# Patient Record
Sex: Male | Born: 1965 | Race: White | Hispanic: No | Smoking: Never smoker
Health system: Southern US, Community
[De-identification: ages and names within clinical notes are randomized; demographics above are authoritative.]

## PROBLEM LIST (undated history)

## (undated) DIAGNOSIS — F419 Anxiety disorder, unspecified: Secondary | ICD-10-CM

## (undated) DIAGNOSIS — F1010 Alcohol abuse, uncomplicated: Secondary | ICD-10-CM

## (undated) DIAGNOSIS — F329 Major depressive disorder, single episode, unspecified: Secondary | ICD-10-CM

## (undated) DIAGNOSIS — K219 Gastro-esophageal reflux disease without esophagitis: Secondary | ICD-10-CM

## (undated) DIAGNOSIS — F32A Depression, unspecified: Secondary | ICD-10-CM

## (undated) DIAGNOSIS — I10 Essential (primary) hypertension: Secondary | ICD-10-CM

## (undated) HISTORY — DX: Gastro-esophageal reflux disease without esophagitis: K21.9

## (undated) HISTORY — PX: NO PAST SURGERIES: SHX2092

---

## 2003-01-29 ENCOUNTER — Emergency Department (HOSPITAL_COMMUNITY): Admission: EM | Admit: 2003-01-29 | Discharge: 2003-01-29 | Payer: Self-pay | Admitting: Emergency Medicine

## 2010-11-25 ENCOUNTER — Ambulatory Visit
Admission: RE | Admit: 2010-11-25 | Discharge: 2010-11-25 | Disposition: A | Discharge status: Home / Self Care | Payer: Self-pay | Source: Ambulatory Visit | Attending: Allergy | Admitting: Allergy

## 2010-11-25 ENCOUNTER — Other Ambulatory Visit: Payer: Self-pay | Admitting: Allergy

## 2010-11-25 DIAGNOSIS — R05 Cough: Secondary | ICD-10-CM

## 2012-05-16 ENCOUNTER — Ambulatory Visit: Payer: Managed Care, Other (non HMO) | Admitting: Family Medicine

## 2012-05-16 DIAGNOSIS — Z0289 Encounter for other administrative examinations: Secondary | ICD-10-CM

## 2012-12-06 ENCOUNTER — Emergency Department (HOSPITAL_COMMUNITY): Payer: Managed Care, Other (non HMO)

## 2012-12-06 ENCOUNTER — Encounter (HOSPITAL_COMMUNITY): Payer: Self-pay | Admitting: Emergency Medicine

## 2012-12-06 ENCOUNTER — Emergency Department (HOSPITAL_COMMUNITY)
Admission: EM | Admit: 2012-12-06 | Discharge: 2012-12-06 | Disposition: A | Discharge status: Home / Self Care | Payer: Managed Care, Other (non HMO) | Attending: Emergency Medicine | Admitting: Emergency Medicine

## 2012-12-06 DIAGNOSIS — R42 Dizziness and giddiness: Secondary | ICD-10-CM | POA: Insufficient documentation

## 2012-12-06 DIAGNOSIS — H538 Other visual disturbances: Secondary | ICD-10-CM | POA: Insufficient documentation

## 2012-12-06 DIAGNOSIS — Z79899 Other long term (current) drug therapy: Secondary | ICD-10-CM | POA: Insufficient documentation

## 2012-12-06 LAB — RAPID URINE DRUG SCREEN, HOSP PERFORMED
Amphetamines: NOT DETECTED
Opiates: NOT DETECTED
Tetrahydrocannabinol: NOT DETECTED

## 2012-12-06 LAB — URINALYSIS, ROUTINE W REFLEX MICROSCOPIC
Hgb urine dipstick: NEGATIVE
Ketones, ur: NEGATIVE mg/dL
Protein, ur: NEGATIVE mg/dL
Urobilinogen, UA: 0.2 mg/dL (ref 0.0–1.0)

## 2012-12-06 LAB — CBC WITH DIFFERENTIAL/PLATELET
Basophils Relative: 0 % (ref 0–1)
Eosinophils Absolute: 0 10*3/uL (ref 0.0–0.7)
Eosinophils Relative: 0 % (ref 0–5)
Lymphs Abs: 1.8 10*3/uL (ref 0.7–4.0)
MCH: 31 pg (ref 26.0–34.0)
MCHC: 34.6 g/dL (ref 30.0–36.0)
MCV: 89.4 fL (ref 78.0–100.0)
Monocytes Relative: 8 % (ref 3–12)
Neutro Abs: 5.3 10*3/uL (ref 1.7–7.7)
Platelets: 247 10*3/uL (ref 150–400)
RBC: 4.91 MIL/uL (ref 4.22–5.81)
RDW: 12.4 % (ref 11.5–15.5)
WBC: 7.8 10*3/uL (ref 4.0–10.5)

## 2012-12-06 LAB — APTT: aPTT: 28 seconds (ref 24–37)

## 2012-12-06 LAB — POCT I-STAT, CHEM 8
Calcium, Ion: 1.27 mmol/L — ABNORMAL HIGH (ref 1.12–1.23)
Chloride: 97 mEq/L (ref 96–112)
Creatinine, Ser: 1.2 mg/dL (ref 0.50–1.35)
Glucose, Bld: 86 mg/dL (ref 70–99)
Potassium: 3.9 mEq/L (ref 3.5–5.1)

## 2012-12-06 LAB — POCT I-STAT TROPONIN I

## 2012-12-06 LAB — PROTIME-INR: Prothrombin Time: 12.6 seconds (ref 11.6–15.2)

## 2012-12-06 LAB — GLUCOSE, CAPILLARY: Glucose-Capillary: 80 mg/dL (ref 70–99)

## 2012-12-06 MED ORDER — MECLIZINE HCL 25 MG PO TABS: 25.0000 mg | ORAL_TABLET | Freq: Three times a day (TID) | ORAL | Status: DC | PRN

## 2012-12-06 MED ORDER — GADOBENATE DIMEGLUMINE 529 MG/ML IV SOLN
15.0000 mL | Freq: Once | INTRAVENOUS | Status: AC | PRN
  Administered 2012-12-06: 15 mL via INTRAVENOUS

## 2012-12-06 NOTE — Consult Note (Signed)
Reason for Consult: Persistent vertigo.  HPI:                                                                                                                                          Alex Logan is an 47 y.o. male with no documented previous medical history who has been experiencing symptoms of disequilibrium since 12/03/2012. He said he said a feeling of turning or spinning, as well as a feeling of falling to one side. Symptoms are worsened with movement. He's had episodes of nausea and vomiting as well. He has not had visual changes including no diplopia. He said no changes in speech or swallowing. She also has not had motor or sensory abnormalities focally. He was given meclizine earlier today at urgent care and is feeling better at this point. CT scan of his head as well as MRI showed no intracranial abnormalities.  History reviewed. No pertinent past medical history.  History reviewed. No pertinent past surgical history.  History reviewed. No pertinent family history.  Social History:  reports that he has never smoked. He does not have any smokeless tobacco history on file. He reports that he drinks alcohol. He reports that he does not use illicit drugs.  No Known Allergies  MEDICATIONS:                                                                                                                     I have reviewed the patient's current medications.   ROS:                                                                                                                                       History obtained from the patient  General ROS: negative for - chills, fatigue, fever, night sweats, weight gain or  weight loss Psychological ROS: negative for - behavioral disorder, hallucinations, memory difficulties, mood swings or suicidal ideation Ophthalmic ROS: negative for - blurry vision, double vision, eye pain or loss of vision ENT ROS: negative for - epistaxis, nasal discharge, oral  lesions, sore throat, tinnitus or vertigo Allergy and Immunology ROS: negative for - hives or itchy/watery eyes Hematological and Lymphatic ROS: negative for - bleeding problems, bruising or swollen lymph nodes Endocrine ROS: negative for - galactorrhea, hair pattern changes, polydipsia/polyuria or temperature intolerance Respiratory ROS: negative for - cough, hemoptysis, shortness of breath or wheezing Cardiovascular ROS: negative for - chest pain, dyspnea on exertion, edema or irregular heartbeat Gastrointestinal ROS: negative for - abdominal pain, diarrhea, hematemesis, nausea/vomiting or stool incontinence Genito-Urinary ROS: negative for - dysuria, hematuria, incontinence or urinary frequency/urgency Musculoskeletal ROS: negative for - joint swelling or muscular weakness Neurological ROS: as noted in HPI Dermatological ROS: negative for rash and skin lesion changes   Blood pressure 130/88, pulse 84, temperature 97.4 F (36.3 C), temperature source Oral, resp. rate 18, height 5\' 11"  (1.803 m), weight 74.844 kg (165 lb), SpO2 98.00%.   Neurologic Examination:                                                                                                      Mental Status: Alert, oriented, thought content appropriate.  Speech fluent without evidence of aphasia. Able to follow commands without difficulty. No onset of symptoms of vertigo with movement including sitting from supine position, as well as with standing. Cranial Nerves: II-Visual fields were normal. III/IV/VI-Pupils were equal and reacted. Extraocular movements were full and conjugate with no nystagmus.    V/VII-no facial numbness and no facial weakness. VIII-normal. X-normal speech and symmetrical palatal movement. Motor: 5/5 bilaterally with normal tone and bulk Sensory: Normal throughout; no Romberg sign. Deep Tendon Reflexes: 2+ and symmetric. Plantars: Mute bilaterally Cerebellar: Normal finger-to-nose testing; no  ataxia on standing.   No results found for this basename: cbc, bmp, coags, chol, tri, ldl, hga1c    Results for orders placed during the hospital encounter of 12/06/12 (from the past 48 hour(s))  ETHANOL     Status: None   Collection Time    12/06/12  2:29 PM      Result Value Range   Alcohol, Ethyl (B) <11  0 - 11 mg/dL   Comment:            LOWEST DETECTABLE LIMIT FOR     SERUM ALCOHOL IS 11 mg/dL     FOR MEDICAL PURPOSES ONLY  APTT     Status: None   Collection Time    12/06/12  2:29 PM      Result Value Range   aPTT 28  24 - 37 seconds  CBC WITH DIFFERENTIAL     Status: None   Collection Time    12/06/12  2:29 PM      Result Value Range   WBC 7.8  4.0 - 10.5 K/uL   RBC 4.91  4.22 - 5.81 MIL/uL   Hemoglobin 15.2  13.0 - 17.0  g/dL   HCT 16.1  09.6 - 04.5 %   MCV 89.4  78.0 - 100.0 fL   MCH 31.0  26.0 - 34.0 pg   MCHC 34.6  30.0 - 36.0 g/dL   RDW 40.9  81.1 - 91.4 %   Platelets 247  150 - 400 K/uL   Neutrophils Relative % 68  43 - 77 %   Neutro Abs 5.3  1.7 - 7.7 K/uL   Lymphocytes Relative 23  12 - 46 %   Lymphs Abs 1.8  0.7 - 4.0 K/uL   Monocytes Relative 8  3 - 12 %   Monocytes Absolute 0.6  0.1 - 1.0 K/uL   Eosinophils Relative 0  0 - 5 %   Eosinophils Absolute 0.0  0.0 - 0.7 K/uL   Basophils Relative 0  0 - 1 %   Basophils Absolute 0.0  0.0 - 0.1 K/uL  PROTIME-INR     Status: None   Collection Time    12/06/12  2:29 PM      Result Value Range   Prothrombin Time 12.6  11.6 - 15.2 seconds   INR 0.96  0.00 - 1.49  GLUCOSE, CAPILLARY     Status: None   Collection Time    12/06/12  2:52 PM      Result Value Range   Glucose-Capillary 80  70 - 99 mg/dL  POCT I-STAT TROPONIN I     Status: None   Collection Time    12/06/12  2:58 PM      Result Value Range   Troponin i, poc 0.00  0.00 - 0.08 ng/mL   Comment 3            Comment: Due to the release kinetics of cTnI,     a negative result within the first hours     of the onset of symptoms does not rule out      myocardial infarction with certainty.     If myocardial infarction is still suspected,     repeat the test at appropriate intervals.  POCT I-STAT, CHEM 8     Status: Abnormal   Collection Time    12/06/12  2:59 PM      Result Value Range   Sodium 138  135 - 145 mEq/L   Potassium 3.9  3.5 - 5.1 mEq/L   Chloride 97  96 - 112 mEq/L   BUN 9  6 - 23 mg/dL   Creatinine, Ser 7.82  0.50 - 1.35 mg/dL   Glucose, Bld 86  70 - 99 mg/dL   Calcium, Ion 9.56 (*) 1.12 - 1.23 mmol/L   TCO2 29  0 - 100 mmol/L   Hemoglobin 16.0  13.0 - 17.0 g/dL   HCT 21.3  08.6 - 57.8 %  URINE RAPID DRUG SCREEN (HOSP PERFORMED)     Status: None   Collection Time    12/06/12  5:25 PM      Result Value Range   Opiates NONE DETECTED  NONE DETECTED   Cocaine NONE DETECTED  NONE DETECTED   Benzodiazepines NONE DETECTED  NONE DETECTED   Amphetamines NONE DETECTED  NONE DETECTED   Tetrahydrocannabinol NONE DETECTED  NONE DETECTED   Barbiturates NONE DETECTED  NONE DETECTED   Comment:            DRUG SCREEN FOR MEDICAL PURPOSES     ONLY.  IF CONFIRMATION IS NEEDED     FOR ANY PURPOSE, NOTIFY LAB  WITHIN 5 DAYS.                LOWEST DETECTABLE LIMITS     FOR URINE DRUG SCREEN     Drug Class       Cutoff (ng/mL)     Amphetamine      1000     Barbiturate      200     Benzodiazepine   200     Tricyclics       300     Opiates          300     Cocaine          300     THC              50  URINALYSIS, ROUTINE W REFLEX MICROSCOPIC     Status: None   Collection Time    12/06/12  5:25 PM      Result Value Range   Color, Urine YELLOW  YELLOW   APPearance CLEAR  CLEAR   Specific Gravity, Urine 1.015  1.005 - 1.030   pH 6.5  5.0 - 8.0   Glucose, UA NEGATIVE  NEGATIVE mg/dL   Hgb urine dipstick NEGATIVE  NEGATIVE   Bilirubin Urine NEGATIVE  NEGATIVE   Ketones, ur NEGATIVE  NEGATIVE mg/dL   Protein, ur NEGATIVE  NEGATIVE mg/dL   Urobilinogen, UA 0.2  0.0 - 1.0 mg/dL   Nitrite NEGATIVE  NEGATIVE    Leukocytes, UA NEGATIVE  NEGATIVE   Comment: MICROSCOPIC NOT DONE ON URINES WITH NEGATIVE PROTEIN, BLOOD, LEUKOCYTES, NITRITE, OR GLUCOSE <1000 mg/dL.    Ct Head Wo Contrast  12/06/2012   CLINICAL DATA:  Dizziness, nausea, vomiting  EXAM: CT HEAD WITHOUT CONTRAST  TECHNIQUE: Contiguous axial images were obtained from the base of the skull through the vertex without intravenous contrast.  COMPARISON:  None.  FINDINGS: No acute intracranial abnormality is identified. Specifically, negative for hemorrhage, hydrocephalus, mass effect, mass lesion, or evidence of acute cortically based infarction. The skull is intact. The imaged paranasal sinuses and mastoid air cells are clear. The middle ears are clear. Soft tissues of the scalp and orbits are symmetric.  IMPRESSION: No acute intracranial abnormality.   Electronically Signed   By: Britta Mccreedy M.D.   On: 12/06/2012 14:45   Mr Laqueta Jean ZH Contrast  12/06/2012   CLINICAL DATA:  47 year old male with dizziness starting several days ago. Continued, recurrent episodes of dizziness, ataxia. Vomiting. Nystagmus. Initial encounter.  EXAM: MRI HEAD WITHOUT AND WITH CONTRAST  TECHNIQUE: Multiplanar, multiecho pulse sequences of the brain and surrounding structures were obtained without and with intravenous contrast.  CONTRAST:  15mL MULTIHANCE GADOBENATE DIMEGLUMINE 529 MG/ML IV SOLN  COMPARISON:  Head CT without contrast 12/06/2012.  FINDINGS: Cerebral volume is normal. No restricted diffusion to suggest acute infarction. No midline shift, mass effect, evidence of mass lesion, ventriculomegaly, extra-axial collection or acute intracranial hemorrhage. Cervicomedullary junction and pituitary are within normal limits. Negative visualized cervical spine. Major intracranial vascular flow voids are preserved.  Wallace Cullens and white matter signal is within normal limits for age throughout the brain.  Visualized internal auditory structures appear normal. Mastoids are clear. No  abnormal enhancement identified.  Left maxillary sinus mucous retention cyst. Other paranasal sinuses are clear. Visualized orbit soft tissues are within normal limits. Visualized scalp soft tissues are within normal limits. Normal bone marrow signal.  IMPRESSION: Normal for age MRI appearance of the brain with no acute intracranial abnormality.  Electronically Signed   By: Augusto Gamble M.D.   On: 12/06/2012 17:12   Assessment/Plan: 47 year old man presenting with probable benign positional vertigo, most likely manifestation of peripheral labyrinth dysfunction. Patient has no indication of acute posterior fossa lesion. Symptoms have markedly improved with meclizine.  Recommendations: 1. No indications at this point for inpatient observation or admission. 2. No further neurodiagnostic studies are indicated. 3. Continue meclizine 25 mg 3 times a day when necessary vertigo. 4. Followup with primary physician if symptoms persist.  C.R. Roseanne Reno, MD [facility] [phone]  12/06/2012, 8:40 PM

## 2012-12-06 NOTE — ED Provider Notes (Signed)
CSN: [id number]     Arrival date & time 12/06/12  1254 History   First MD Initiated Contact with Patient 12/06/12 1409     Chief Complaint  Patient presents with  . Dizziness   (Consider location/radiation/quality/duration/timing/severity/associated sxs/prior Treatment) HPI Comments: Patient is otherwise healthy 47 year old male who presents to the ED with dizziness starting on Tuesday.  He states that he initially noticed the dizziness while at work, where he felt like the room was spinning.  He states that he went to his car rested for about an hour and this resolved spontaneously.  He states that he went on to his second job and had a few other short lasting episodes.  He states that the evening came and he returned home, had an episode of dizziness after driving home, noticed that he thought his vision was worse, and states that he felt unsteady walking and kept veering to the left.  He felt nauseous and vomited x 1.  He states that he did not join his family for Thanksgiving but that he rested, he states that he had a few more episodes of the dizziness but when he awoke this morning, he noticed more dizziness, and difficulty walking.  He went to the Urgent care and they sent him here for workup for possible CVA.  He reports no focal weakness, numbness or tingling.  No difficulty speaking or swallowing, does have an ataxic gait and dizziness.    Patient is a 47 y.o. male presenting with neurologic complaint. The history is provided by the patient. No language interpreter was used.  Neurologic Problem This is a new problem. The current episode started in the past 7 days. The problem occurs constantly. The problem has been gradually worsening. Associated symptoms include nausea, vertigo and vomiting. Pertinent negatives include no abdominal pain, anorexia, chest pain, chills, congestion, coughing, diaphoresis, fatigue, fever, headaches, myalgias, neck pain, rash, sore throat, visual change or  weakness. Nothing aggravates the symptoms. He has tried nothing for the symptoms. The treatment provided no relief.    History reviewed. No pertinent past medical history. History reviewed. No pertinent past surgical history. History reviewed. No pertinent family history. History  Substance Use Topics  . Smoking status: Never Smoker   . Smokeless tobacco: Not on file  . Alcohol Use: Yes     Comment: vodka every other day    Review of Systems  Constitutional: Negative for fever, chills, diaphoresis and fatigue.  HENT: Negative for congestion and sore throat.   Eyes: Positive for visual disturbance.  Respiratory: Negative for cough.   Cardiovascular: Negative for chest pain.  Gastrointestinal: Positive for nausea and vomiting. Negative for abdominal pain and anorexia.  Musculoskeletal: Negative for myalgias and neck pain.  Skin: Negative for rash.  Neurological: Positive for vertigo. Negative for weakness and headaches.  All other systems reviewed and are negative.    Allergies  Review of patient's allergies indicates no known allergies.  Home Medications   Current Outpatient Rx  Name  Route  Sig  Dispense  Refill  . escitalopram (LEXAPRO) 20 MG tablet   Oral   Take 20 mg by mouth daily.         . Glucosamine HCl (GLUCOSAMINE PO)   Oral   Take 1 tablet by mouth daily.         . Multiple Vitamin (MULTIVITAMIN WITH MINERALS) TABS tablet   Oral   Take 1 tablet by mouth daily.         Marland Kitchen  Omega-3 Fatty Acids (FISH OIL PO)   Oral   Take 1 capsule by mouth daily.          BP 132/90  Pulse 114  Temp(Src) 97.4 F (36.3 C) (Oral)  Resp 24  Ht 5\' 11"  (1.803 m)  Wt 165 lb (74.844 kg)  BMI 23.02 kg/m2  SpO2 100% Physical Exam  Nursing note and vitals reviewed. Constitutional: He is oriented to person, place, and time. He appears well-developed and well-nourished. No distress.  HENT:  Head: Normocephalic and atraumatic.  Right Ear: External ear normal.  Left  Ear: External ear normal.  Nose: Nose normal.  Mouth/Throat: Oropharynx is clear and moist. No oropharyngeal exudate.  Negative dix hallpike maneuver  Eyes: Conjunctivae are normal. Pupils are equal, round, and reactive to light. No scleral icterus.  No nystagmus  Neck: Normal range of motion. Neck supple. No JVD present. Carotid bruit is not present.  Cardiovascular: Normal rate, regular rhythm and normal heart sounds.  Exam reveals no gallop and no friction rub.   No murmur heard. Pulmonary/Chest: Effort normal and breath sounds normal. No respiratory distress. He has no wheezes. He has no rales. He exhibits no tenderness.  Abdominal: Soft. Bowel sounds are normal. He exhibits no distension. There is no tenderness. There is no rebound and no guarding.  Musculoskeletal: Normal range of motion. He exhibits no edema and no tenderness.  Neurological: He is alert and oriented to person, place, and time. He has normal reflexes. No cranial nerve deficit or sensory deficit. He exhibits normal muscle tone. Gait abnormal. Coordination normal. GCS eye subscore is 4. GCS verbal subscore is 5. GCS motor subscore is 6.  Slightly ataxic gait, negative romberg, slight overreach with finger to nose testing.  Skin: Skin is warm and dry. No rash noted. No erythema. No pallor.  Psychiatric: He has a normal mood and affect. His behavior is normal. Judgment and thought content normal.    ED Course  Procedures (including critical care time) Labs Review Labs Reviewed  GLUCOSE, CAPILLARY  ETHANOL  URINE RAPID DRUG SCREEN (HOSP PERFORMED)  URINALYSIS, ROUTINE W REFLEX MICROSCOPIC  APTT  CBC WITH DIFFERENTIAL  PROTIME-INR   Imaging Review Ct Head Wo Contrast  12/06/2012   CLINICAL DATA:  Dizziness, nausea, vomiting  EXAM: CT HEAD WITHOUT CONTRAST  TECHNIQUE: Contiguous axial images were obtained from the base of the skull through the vertex without intravenous contrast.  COMPARISON:  None.  FINDINGS: No  acute intracranial abnormality is identified. Specifically, negative for hemorrhage, hydrocephalus, mass effect, mass lesion, or evidence of acute cortically based infarction. The skull is intact. The imaged paranasal sinuses and mastoid air cells are clear. The middle ears are clear. Soft tissues of the scalp and orbits are symmetric.  IMPRESSION: No acute intracranial abnormality.   Electronically Signed   By: Britta Mccreedy M.D.   On: 12/06/2012 14:45    EKG Interpretation   None      Results for orders placed during the hospital encounter of 12/06/12  ETHANOL      Result Value Range   Alcohol, Ethyl (B) <11  0 - 11 mg/dL  APTT      Result Value Range   aPTT 28  24 - 37 seconds  CBC WITH DIFFERENTIAL      Result Value Range   WBC 7.8  4.0 - 10.5 K/uL   RBC 4.91  4.22 - 5.81 MIL/uL   Hemoglobin 15.2  13.0 - 17.0 g/dL   HCT 43.9  39.0 - 52.0 %   MCV 89.4  78.0 - 100.0 fL   MCH 31.0  26.0 - 34.0 pg   MCHC 34.6  30.0 - 36.0 g/dL   RDW 08.6  57.8 - 46.9 %   Platelets 247  150 - 400 K/uL   Neutrophils Relative % 68  43 - 77 %   Neutro Abs 5.3  1.7 - 7.7 K/uL   Lymphocytes Relative 23  12 - 46 %   Lymphs Abs 1.8  0.7 - 4.0 K/uL   Monocytes Relative 8  3 - 12 %   Monocytes Absolute 0.6  0.1 - 1.0 K/uL   Eosinophils Relative 0  0 - 5 %   Eosinophils Absolute 0.0  0.0 - 0.7 K/uL   Basophils Relative 0  0 - 1 %   Basophils Absolute 0.0  0.0 - 0.1 K/uL  PROTIME-INR      Result Value Range   Prothrombin Time 12.6  11.6 - 15.2 seconds   INR 0.96  0.00 - 1.49  GLUCOSE, CAPILLARY      Result Value Range   Glucose-Capillary 80  70 - 99 mg/dL  POCT I-STAT, CHEM 8      Result Value Range   Sodium 138  135 - 145 mEq/L   Potassium 3.9  3.5 - 5.1 mEq/L   Chloride 97  96 - 112 mEq/L   BUN 9  6 - 23 mg/dL   Creatinine, Ser 6.29  0.50 - 1.35 mg/dL   Glucose, Bld 86  70 - 99 mg/dL   Calcium, Ion 5.28 (*) 1.12 - 1.23 mmol/L   TCO2 29  0 - 100 mmol/L   Hemoglobin 16.0  13.0 - 17.0 g/dL    HCT 41.3  24.4 - 01.0 %  POCT I-STAT TROPONIN I      Result Value Range   Troponin i, poc 0.00  0.00 - 0.08 ng/mL   Comment 3            Ct Head Wo Contrast  12/06/2012   CLINICAL DATA:  Dizziness, nausea, vomiting  EXAM: CT HEAD WITHOUT CONTRAST  TECHNIQUE: Contiguous axial images were obtained from the base of the skull through the vertex without intravenous contrast.  COMPARISON:  None.  FINDINGS: No acute intracranial abnormality is identified. Specifically, negative for hemorrhage, hydrocephalus, mass effect, mass lesion, or evidence of acute cortically based infarction. The skull is intact. The imaged paranasal sinuses and mastoid air cells are clear. The middle ears are clear. Soft tissues of the scalp and orbits are symmetric.  IMPRESSION: No acute intracranial abnormality.   Electronically Signed   By: Britta Mccreedy M.D.   On: 12/06/2012 14:45     MDM  No diagnosis found.   Patient here with initially vertiginous type symptoms but was sent from an Urgent Care with ataxic gait, vertical nystagmus, and overreach on finger to nose testing, suspect possible central ischemia, awaiting MRI, patient turned over to resident, Dr. Arlie Solomons, who will be following the patient.  Stable at this time.    Izola Price Marisue Humble, PA-C 12/06/12 1625

## 2012-12-06 NOTE — ED Notes (Signed)
Patient attempting to provide a urine sample.

## 2012-12-06 NOTE — ED Notes (Signed)
Pt reports having 1 episode of dizziness, nausea and vomiting on Wednesday and Thursday, constant dizziness and room spinning today, pt denies any nausea or vomiting today

## 2012-12-06 NOTE — ED Provider Notes (Signed)
  At 4:20 PM patient care transferred from [facility]. Please see her note for full H&P. Briefly patient is a 47 year old male transferred here from urgent care. Patient reports having 3 days of vertigo worse with head movement. Also endorses nausea and no vomiting. There care noted that the ataxic sent here. On arrival noted to have coordination dysfunction vertical nystagmus. Concern for CVA versus TIA versus intracranial lesion. CT head normal. Labs benign. MRI pending.  8:02 PM Neurologist and patient believes is peripheral vertigo. Patient does report some relief of symptoms with previously given meclizine. Will provide prescription. Follow up with PCP. On reevaluation able to ambulate, no apparent distress, nontoxic-appearing appropriate for discharge.    Bridgett Larsson, MD 12/06/12 2002

## 2012-12-07 NOTE — ED Provider Notes (Signed)
Medical screening examination/treatment/procedure(s) were performed by non-physician practitioner and as supervising physician I was immediately available for consultation/collaboration.  EKG Interpretation   None         Enid Skeens, MD 12/07/12 0009

## 2012-12-08 NOTE — ED Provider Notes (Signed)
  I performed a history and physical examination of Alex Logan and discussed his management with Ms. Sanford.  I agree with the history, physical, assessment, and plan of care, with the following exceptions: None  I was present for the following procedures: None Time Spent in Critical Care of the patient: None Time spent in discussions with the patient and family: 7    Shelbe Haglund S    Hilario Quarry, MD 12/08/12 [phone]

## 2015-11-02 ENCOUNTER — Encounter (HOSPITAL_COMMUNITY): Payer: Self-pay | Admitting: Emergency Medicine

## 2015-11-02 ENCOUNTER — Emergency Department (HOSPITAL_COMMUNITY)
Admission: EM | Admit: 2015-11-02 | Discharge: 2015-11-03 | Discharge status: Against Medical Advice | Payer: Self-pay | Attending: Emergency Medicine | Admitting: Emergency Medicine

## 2015-11-02 ENCOUNTER — Emergency Department (HOSPITAL_COMMUNITY): Payer: Self-pay

## 2015-11-02 DIAGNOSIS — Y939 Activity, unspecified: Secondary | ICD-10-CM | POA: Insufficient documentation

## 2015-11-02 DIAGNOSIS — Z23 Encounter for immunization: Secondary | ICD-10-CM | POA: Insufficient documentation

## 2015-11-02 DIAGNOSIS — F10929 Alcohol use, unspecified with intoxication, unspecified: Secondary | ICD-10-CM

## 2015-11-02 DIAGNOSIS — W19XXXA Unspecified fall, initial encounter: Secondary | ICD-10-CM | POA: Insufficient documentation

## 2015-11-02 DIAGNOSIS — Z79899 Other long term (current) drug therapy: Secondary | ICD-10-CM | POA: Insufficient documentation

## 2015-11-02 DIAGNOSIS — S0083XA Contusion of other part of head, initial encounter: Secondary | ICD-10-CM | POA: Insufficient documentation

## 2015-11-02 DIAGNOSIS — Y9289 Other specified places as the place of occurrence of the external cause: Secondary | ICD-10-CM | POA: Insufficient documentation

## 2015-11-02 DIAGNOSIS — Y999 Unspecified external cause status: Secondary | ICD-10-CM | POA: Insufficient documentation

## 2015-11-02 DIAGNOSIS — F10120 Alcohol abuse with intoxication, uncomplicated: Secondary | ICD-10-CM | POA: Insufficient documentation

## 2015-11-02 LAB — CBC WITH DIFFERENTIAL/PLATELET
Basophils Absolute: 0 10*3/uL (ref 0.0–0.1)
Basophils Relative: 0 %
EOS ABS: 0 10*3/uL (ref 0.0–0.7)
EOS PCT: 0 %
HCT: 42.9 % (ref 39.0–52.0)
Hemoglobin: 14.9 g/dL (ref 13.0–17.0)
LYMPHS ABS: 1.5 10*3/uL (ref 0.7–4.0)
Lymphocytes Relative: 16 %
MCH: 31.9 pg (ref 26.0–34.0)
MCHC: 34.7 g/dL (ref 30.0–36.0)
MCV: 91.9 fL (ref 78.0–100.0)
Monocytes Absolute: 0.6 10*3/uL (ref 0.1–1.0)
Monocytes Relative: 7 %
Neutro Abs: 7 10*3/uL (ref 1.7–7.7)
Neutrophils Relative %: 77 %
PLATELETS: 196 10*3/uL (ref 150–400)
RBC: 4.67 MIL/uL (ref 4.22–5.81)
RDW: 12.7 % (ref 11.5–15.5)
WBC: 9.2 10*3/uL (ref 4.0–10.5)

## 2015-11-02 LAB — BASIC METABOLIC PANEL
Anion gap: 8 (ref 5–15)
BUN: 11 mg/dL (ref 6–20)
CALCIUM: 8.8 mg/dL — AB (ref 8.9–10.3)
CO2: 25 mmol/L (ref 22–32)
CREATININE: 0.84 mg/dL (ref 0.61–1.24)
Chloride: 106 mmol/L (ref 101–111)
GFR calc non Af Amer: >60 mL/min (ref >60)
GLUCOSE: 107 mg/dL — AB (ref 65–99)
Potassium: 3.6 mmol/L (ref 3.5–5.1)
Sodium: 139 mmol/L (ref 135–145)

## 2015-11-02 LAB — ETHANOL: Alcohol, Ethyl (B): 299 mg/dL — ABNORMAL HIGH (ref <5)

## 2015-11-02 NOTE — Progress Notes (Signed)
EDCM spoke to patient at bedside. Patient confirms he does not have a pcp or insurance living in [place].  EDCM provided patient with contact information to [facility], informed patient of services there and walk in times.  EDCM also provided patient with list of pcps who accept self pay patients, list of discount pharmacies and websites needymeds.org and GoodRX.com for medication assistance, phone number to inquire about the orange card, phone number to inquire about [employer], phone number to inquire about the Affordable Care Act, financial resources in the community such as local churches, [employer], urban ministries, and dental assistance for uninsured patients.  Patient thankful for resources.  No further EDCM needs at this time. 

## 2015-11-02 NOTE — ED Triage Notes (Signed)
Patient BIB [facility] from [facility]. Patient was witnessed outside to have vomited x1, urinated on self, has syncopal episode in which pt fell on pavement and hit rt side of forehead. Hematoma noted above rt eye. Patient denies taking any blood thinners. EMS reports halo test negative for CSF, GCS of 10, patient nodding yes and no on scene. Patient fully mobilized. EMS reports pupils 6 mm w/ sluggish reaction. Given 4mg  Zofran by EMS. Patient now speaking in response to questions.

## 2015-11-02 NOTE — ED Notes (Signed)
Bed: [id number] Expected date:  Expected time:  Means of arrival:  Comments: Etoh, fall, head injury

## 2015-11-03 MED ORDER — TETANUS-DIPHTH-ACELL PERTUSSIS 5-2.5-18.5 LF-MCG/0.5 IM SUSP
0.5000 mL | Freq: Once | INTRAMUSCULAR | Status: AC
  Administered 2015-11-03: 0.5 mL via INTRAMUSCULAR
  Filled 2015-11-03: qty 0.5

## 2015-11-03 NOTE — ED Provider Notes (Signed)
PT left at change of shift (1:30 AM) to be rechecked once sober. Pt has had CT of head and cervical spine and C collar left on until sober for recheck.  03:00 nurse reports patient left AMA after getting himself dressed and removing his collar.   Devoria AlbeIva Zyra Parrillo, MD, [facility]    Beren Yniguez, MD 11/03/15 [phone]

## 2015-11-03 NOTE — ED Provider Notes (Signed)
[facility]-EMERGENCY DEPT Provider Note   CSN: [id number] Arrival date & time: 11/02/15  2026     History   Chief Complaint Chief Complaint  Patient presents with  . Fall  . Alcohol Intoxication    HPI Alex Logan is a 50 y.o. male.  He was found outside of a bar where he had fallen down and was suspected to be intoxicated. Treated, seen by EMS who transferred him here. He is unable to give any history.  Level V caveat- altered mental status  HPI  History reviewed. No pertinent past medical history.  There are no active problems to display for this patient.   History reviewed. No pertinent surgical history.     Home Medications    Prior to Admission medications   Medication Sig Start Date End Date Taking? Authorizing Provider  escitalopram (LEXAPRO) 20 MG tablet Take 20 mg by mouth daily. 11/17/12  Yes Historical Provider, MD  meclizine (ANTIVERT) 25 MG tablet Take 1 tablet (25 mg total) by mouth 3 (three) times daily as needed for dizziness. Patient not taking: Reported on 11/02/2015 12/06/12   Bridgett Larssonhris Post, MD    Family History History reviewed. No pertinent family history.  Social History Social History  Substance Use Topics  . Smoking status: Never Smoker  . Smokeless tobacco: Never Used  . Alcohol use Yes     Comment: vodka every other day     Allergies   Review of patient's allergies indicates no known allergies.   Review of Systems Review of Systems  Unable to perform ROS: Mental status change     Physical Exam Updated Vital Signs BP (!) 156/110 (BP Location: Right Arm)   Pulse 82   Temp 97.8 F (36.6 C) (Oral)   Resp 16   SpO2 98%   Physical Exam  Constitutional: He appears well-developed and well-nourished.  HENT:  Head: Normocephalic.  Right Ear: External ear normal.  Left Ear: External ear normal.  Contusion right forehead with abrasion, and abrasion right cheek. No midface instability. No crepitation of the scalp.  Eyes:  Conjunctivae and EOM are normal. Pupils are equal, round, and reactive to light. Right eye exhibits no discharge. Left eye exhibits no discharge.  Neck: Normal range of motion and phonation normal. Neck supple.  Cardiovascular: Normal rate, regular rhythm and normal heart sounds.   Pulmonary/Chest: Effort normal and breath sounds normal. He exhibits no tenderness and no bony tenderness.  No chest wall instability  Abdominal: Soft. Bowel sounds are normal. He exhibits no distension. There is no tenderness. There is no guarding.  Musculoskeletal: Normal range of motion. He exhibits no edema, tenderness or deformity.  No palpable step-off of the cervical, thoracic or lumbar spine. Spontaneously moving arms and legs normally.  Neurological: No cranial nerve deficit or sensory deficit. He exhibits normal muscle tone. Coordination normal.  Lethargic, follows simple commands. He is dysarthric. Unable to assess for a aphasia.  Skin: Skin is warm, dry and intact.  Psychiatric: He has a normal mood and affect. His behavior is normal.  Nursing note and vitals reviewed.    ED Treatments / Results  Labs (all labs ordered are listed, but only abnormal results are displayed) Labs Reviewed  BASIC METABOLIC PANEL - Abnormal; Notable for the following:       Result Value   Glucose, Bld 107 (*)    Calcium 8.8 (*)    All other components within normal limits  ETHANOL - Abnormal; Notable for the following:  Alcohol, Ethyl (B) 299 (*)    All other components within normal limits  CBC WITH DIFFERENTIAL/PLATELET  RAPID URINE DRUG SCREEN, HOSP PERFORMED    EKG  EKG Interpretation None       Radiology Ct Head Wo Contrast  Result Date: 11/02/2015 CLINICAL DATA:  Syncopal episode outside bar, struck RIGHT forehead. Sluggish pupil reaction. Intoxication. EXAM: CT HEAD WITHOUT CONTRAST CT CERVICAL SPINE WITHOUT CONTRAST TECHNIQUE: Multidetector CT imaging of the head and cervical spine was performed  following the standard protocol without intravenous contrast. Multiplanar CT image reconstructions of the cervical spine were also generated. COMPARISON:  MRI head December 06, 2012 FINDINGS: CT HEAD FINDINGS BRAIN: The ventricles and sulci are normal. No intraparenchymal hemorrhage, mass effect nor midline shift. No acute large vascular territory infarcts. No abnormal extra-axial fluid collections. Basal cisterns are patent. VASCULAR: Unremarkable. SKULL/SOFT TISSUES: No skull fracture. Small RIGHT periorbital/frontal scalp hematoma. No subcutaneous gas radiopaque foreign bodies. ORBITS/SINUSES: The included ocular globes and orbital contents are normal.Sphenoid sinus mucosal thickening. Mastoid air cells are well aerated. OTHER: None. CT CERVICAL SPINE FINDINGS ALIGNMENT: Vertebral bodies in alignment.  Straightened lordosis. SKULL BASE AND VERTEBRAE: Cervical vertebral bodies and posterior elements are intact. Mild C5-6 disc height loss uncovertebral hypertrophy compatible with degenerative disc, C6-7 uncovertebral hypertrophy. No destructive bony lesions. C1-2 articulation maintained. SOFT TISSUES AND SPINAL CANAL: Normal. DISC LEVELS: No significant osseous canal stenosis. Mild RIGHT C5-6 and moderate RIGHT grand LEFT C6-7 neural foraminal narrowing. UPPER CHEST: Lung apices are clear. OTHER: None. IMPRESSION: CT HEAD: Small RIGHT frontal/periorbital scalp hematoma. No skull fracture. Otherwise negative CT HEAD. CT CERVICAL SPINE: No acute fracture or malalignment. Electronically Signed   By: Awilda Metro M.D.   On: 11/02/2015 22:29   Ct Cervical Spine Wo Contrast  Result Date: 11/02/2015 CLINICAL DATA:  Syncopal episode outside bar, struck RIGHT forehead. Sluggish pupil reaction. Intoxication. EXAM: CT HEAD WITHOUT CONTRAST CT CERVICAL SPINE WITHOUT CONTRAST TECHNIQUE: Multidetector CT imaging of the head and cervical spine was performed following the standard protocol without intravenous contrast.  Multiplanar CT image reconstructions of the cervical spine were also generated. COMPARISON:  MRI head December 06, 2012 FINDINGS: CT HEAD FINDINGS BRAIN: The ventricles and sulci are normal. No intraparenchymal hemorrhage, mass effect nor midline shift. No acute large vascular territory infarcts. No abnormal extra-axial fluid collections. Basal cisterns are patent. VASCULAR: Unremarkable. SKULL/SOFT TISSUES: No skull fracture. Small RIGHT periorbital/frontal scalp hematoma. No subcutaneous gas radiopaque foreign bodies. ORBITS/SINUSES: The included ocular globes and orbital contents are normal.Sphenoid sinus mucosal thickening. Mastoid air cells are well aerated. OTHER: None. CT CERVICAL SPINE FINDINGS ALIGNMENT: Vertebral bodies in alignment.  Straightened lordosis. SKULL BASE AND VERTEBRAE: Cervical vertebral bodies and posterior elements are intact. Mild C5-6 disc height loss uncovertebral hypertrophy compatible with degenerative disc, C6-7 uncovertebral hypertrophy. No destructive bony lesions. C1-2 articulation maintained. SOFT TISSUES AND SPINAL CANAL: Normal. DISC LEVELS: No significant osseous canal stenosis. Mild RIGHT C5-6 and moderate RIGHT grand LEFT C6-7 neural foraminal narrowing. UPPER CHEST: Lung apices are clear. OTHER: None. IMPRESSION: CT HEAD: Small RIGHT frontal/periorbital scalp hematoma. No skull fracture. Otherwise negative CT HEAD. CT CERVICAL SPINE: No acute fracture or malalignment. Electronically Signed   By: Awilda Metro M.D.   On: 11/02/2015 22:29    Procedures Procedures (including critical care time)  Medications Ordered in ED Medications - No data to display   Initial Impression / Assessment and Plan / ED Course  I have reviewed the triage vital signs and the  nursing notes.  Pertinent labs & imaging results that were available during my care of the patient were reviewed by me and considered in my medical decision making (see chart for details).  Clinical Course     Medications - No data to display  Patient Vitals for the past 24 hrs:  BP Temp Temp src Pulse Resp SpO2  11/03/15 0027 109/77 - - 80 16 97 %  11/02/15 2048 - - - - - 98 %  11/02/15 2037 (!) 156/110 97.8 F (36.6 C) Oral 82 16 100 %  11/02/15 2035 - - - - - 100 %    12:29 AM Reevaluation with update and discussion. After initial assessment and treatment, an updated evaluation reveals He continues to be lethargic, and is resting comfortably. Vital signs are reassuring. Wounds are being cleansed at this time. Cervical collar remains on and will need to stay on and tell he is clinically sober enough to evaluate the neck without the collar on. Deforest Maiden L    Final Clinical Impressions(s) / ED Diagnoses   Final diagnoses:  Alcoholic intoxication with complication (HCC)  Fall, initial encounter    Fall with alcohol intoxication. No evidence for severe injury. Cervical spine is not cleared, since he is obtunded. Cervical spine will need to be assessed after he becomes sober.  Nursing Notes Reviewed/ Care Coordinated, and agree without changes. Applicable Imaging Reviewed.  Interpretation of Laboratory Data incorporated into ED treatment  Plan- reevaluation by oncoming provider team, when he becomes sober. Neck will need to be assessed prior to discharge.  New Prescriptions New Prescriptions   No medications on file     Mancel Bale, MD 11/03/15 [phone]

## 2015-11-03 NOTE — ED Notes (Addendum)
Patient not in room. Patient removed c-collar, put clothes on and went out into waiting room. The triage RN tired to get patient to come back to room, patient denied being a patient here. This nurse went out to speak to him and he was no longer in waiting room. Patient was found just outside in parking lot, hiding in front of a parked car. Patient stated he was getting an uber home and did not wish to come back into the hospital to be discharged. Patient understood he was leaving [facility]a and discharge instructions were not reviewed.

## 2016-09-13 ENCOUNTER — Encounter (HOSPITAL_COMMUNITY): Payer: Self-pay | Admitting: *Deleted

## 2016-09-13 ENCOUNTER — Emergency Department (HOSPITAL_COMMUNITY)
Admission: EM | Admit: 2016-09-13 | Discharge: 2016-09-13 | Disposition: A | Discharge status: Home / Self Care | Payer: Managed Care, Other (non HMO) | Attending: Emergency Medicine | Admitting: Emergency Medicine

## 2016-09-13 DIAGNOSIS — Y908 Blood alcohol level of 240 mg/100 ml or more: Secondary | ICD-10-CM | POA: Insufficient documentation

## 2016-09-13 DIAGNOSIS — F10129 Alcohol abuse with intoxication, unspecified: Secondary | ICD-10-CM | POA: Insufficient documentation

## 2016-09-13 DIAGNOSIS — F10920 Alcohol use, unspecified with intoxication, uncomplicated: Secondary | ICD-10-CM

## 2016-09-13 DIAGNOSIS — Z79899 Other long term (current) drug therapy: Secondary | ICD-10-CM | POA: Insufficient documentation

## 2016-09-13 LAB — COMPREHENSIVE METABOLIC PANEL
ALBUMIN: 4.1 g/dL (ref 3.5–5.0)
ALT: 323 U/L — ABNORMAL HIGH (ref 17–63)
AST: 289 U/L — ABNORMAL HIGH (ref 15–41)
Alkaline Phosphatase: 46 U/L (ref 38–126)
Anion gap: 16 — ABNORMAL HIGH (ref 5–15)
BILIRUBIN TOTAL: 0.9 mg/dL (ref 0.3–1.2)
BUN: 11 mg/dL (ref 6–20)
CHLORIDE: 96 mmol/L — AB (ref 101–111)
CO2: 23 mmol/L (ref 22–32)
CREATININE: 0.85 mg/dL (ref 0.61–1.24)
Calcium: 9 mg/dL (ref 8.9–10.3)
GFR calc Af Amer: >60 mL/min (ref >60)
GFR calc non Af Amer: >60 mL/min (ref >60)
GLUCOSE: 97 mg/dL (ref 65–99)
POTASSIUM: 3.6 mmol/L (ref 3.5–5.1)
Sodium: 135 mmol/L (ref 135–145)
Total Protein: <3 g/dL — ABNORMAL LOW (ref 6.5–8.1)

## 2016-09-13 LAB — CBC WITH DIFFERENTIAL/PLATELET
BASOS PCT: 0 %
Basophils Absolute: 0 10*3/uL (ref 0.0–0.1)
Eosinophils Absolute: 0 10*3/uL (ref 0.0–0.7)
Eosinophils Relative: 0 %
HEMATOCRIT: 42.5 % (ref 39.0–52.0)
Hemoglobin: 14.9 g/dL (ref 13.0–17.0)
LYMPHS PCT: 9 %
Lymphs Abs: 0.7 10*3/uL (ref 0.7–4.0)
MCH: 32.3 pg (ref 26.0–34.0)
MCHC: 35.1 g/dL (ref 30.0–36.0)
MCV: 92 fL (ref 78.0–100.0)
Monocytes Absolute: 0.7 10*3/uL (ref 0.1–1.0)
Monocytes Relative: 10 %
Neutro Abs: 6.2 10*3/uL (ref 1.7–7.7)
Neutrophils Relative %: 81 %
PLATELETS: 182 10*3/uL (ref 150–400)
RBC: 4.62 MIL/uL (ref 4.22–5.81)
RDW: 13.4 % (ref 11.5–15.5)
WBC: 7.6 10*3/uL (ref 4.0–10.5)

## 2016-09-13 LAB — ETHANOL: ALCOHOL ETHYL (B): 357 mg/dL — AB (ref <5)

## 2016-09-13 NOTE — ED Notes (Signed)
Pt appears intoxicated. He has slurred speech and admits to drinking etoh.

## 2016-09-13 NOTE — ED Provider Notes (Signed)
[facility]-EMERGENCY DEPT Provider Note   CSN: [id number] Arrival date & time: 09/13/16  1853     History   Chief Complaint Chief Complaint  Patient presents with  . Alcohol Intoxication    HPI   Blood pressure 133/88, pulse (!) 102, temperature (!) 97.4 F (36.3 C), temperature source Oral, resp. rate 12, SpO2 96 %.  Alex Logan is a 51 y.o. male brought in by EMS after being picked up at [facility] for intoxication. Patient with no complaints, states he wants to leave. States he will call in over 2 take him home. He denies any headache, nausea, vomiting, chest pain or shortness of breath.   History reviewed. No pertinent past medical history.  There are no active problems to display for this patient.   History reviewed. No pertinent surgical history.     Home Medications    Prior to Admission medications   Medication Sig Start Date End Date Taking? Authorizing Provider  escitalopram (LEXAPRO) 20 MG tablet Take 20 mg by mouth daily. 11/17/12   [provider]  meclizine (ANTIVERT) 25 MG tablet Take 1 tablet (25 mg total) by mouth 3 (three) times daily as needed for dizziness. Patient not taking: Reported on 11/02/2015 12/06/12   Bridgett Larsson, MD    Family History No family history on file.  Social History Social History  Substance Use Topics  . Smoking status: Never Smoker  . Smokeless tobacco: Never Used  . Alcohol use Yes     Comment: vodka every other day     Allergies   Patient has no known allergies.   Review of Systems Review of Systems  A complete review of systems was obtained and all systems are negative except as noted in the HPI and PMH.    Physical Exam Updated Vital Signs BP 133/88 (BP Location: Right Arm)   Pulse (!) 102   Temp (!) 97.4 F (36.3 C) (Oral)   Resp 12   SpO2 96%   Physical Exam  Constitutional: He is oriented to person, place, and time. He appears well-developed and well-nourished. No distress.  HENT:  Head:  Normocephalic and atraumatic.  Mouth/Throat: Oropharynx is clear and moist.  Eyes: Pupils are equal, round, and reactive to light. Conjunctivae and EOM are normal.  Neck: Normal range of motion.  Cardiovascular: Normal rate, regular rhythm and intact distal pulses.   Pulmonary/Chest: Effort normal and breath sounds normal.  Abdominal: Soft. There is no tenderness.  Musculoskeletal: Normal range of motion.  Neurological: He is alert and oriented to person, place, and time.  Slurred speech, oriented to person place and time. Grossly nonfocal neurologic exam. Ambulateswith a steady and coordinated gait.  Skin: He is not diaphoretic.  Psychiatric: He has a normal mood and affect.  Nursing note and vitals reviewed.    ED Treatments / Results  Labs (all labs ordered are listed, but only abnormal results are displayed) Labs Reviewed  CBC WITH DIFFERENTIAL/PLATELET  COMPREHENSIVE METABOLIC PANEL  ETHANOL  RAPID URINE DRUG SCREEN, HOSP PERFORMED    EKG  EKG Interpretation None       Radiology No results found.  Procedures Procedures (including critical care time)  Medications Ordered in ED Medications - No data to display   Initial Impression / Assessment and Plan / ED Course  I have reviewed the triage vital signs and the nursing notes.  Pertinent labs & imaging results that were available during my care of the patient were reviewed by me and considered in  my medical decision making (see chart for details).     Vitals:   09/13/16 1901 09/13/16 1903 09/13/16 1916  BP:   133/88  Pulse: (!) 121 (!) 121 (!) 102  Resp:   12  Temp: (!) 97.4 F (36.3 C)    TempSrc: Oral    SpO2: 99%  96%    Medications - No data to display  Alex Logan is 51 y.o. male presenting with Alcohol intoxication, grossly nonfocal neurologic exam. Patient is initially tachycardic however this is resolved on my evaluation. Patient is adamant that he wants to leave the emergency department,  he is ambulating with a steady gait, discussed with attending physician who has briefly evaluated the patient seen him ambulate and agrees that he is stable for discharge. IV removed. Patient declines head CT, blood work has not been resulted at the time of this patient's discharge.    Final Clinical Impressions(s) / ED Diagnoses   Final diagnoses:  Alcoholic intoxication without complication [facility](HCC)    New Prescriptions New Prescriptions   No medications on file     Kaylyn Limisciotta, Layla Kesling, PA-C 09/13/16 2150    Vanetta MuldersZackowski, Scott, MD 09/14/16 [phone]

## 2016-09-13 NOTE — ED Notes (Signed)
ETOH 357 reported by lab [id number]- 09-13-16

## 2016-09-13 NOTE — ED Triage Notes (Signed)
[facility] was called by [employer] due to pt being escorted out of store for being heavily intoxicated. Pt denies complaints

## 2016-09-22 ENCOUNTER — Emergency Department (HOSPITAL_COMMUNITY)
Admission: EM | Admit: 2016-09-22 | Discharge: 2016-09-24 | Disposition: A | Discharge status: Other Acute Inpatient Hospital | Payer: Managed Care, Other (non HMO) | Attending: Emergency Medicine | Admitting: Emergency Medicine

## 2016-09-22 ENCOUNTER — Encounter (HOSPITAL_COMMUNITY): Payer: Self-pay | Admitting: Emergency Medicine

## 2016-09-22 DIAGNOSIS — R45851 Suicidal ideations: Secondary | ICD-10-CM | POA: Insufficient documentation

## 2016-09-22 DIAGNOSIS — I10 Essential (primary) hypertension: Secondary | ICD-10-CM | POA: Insufficient documentation

## 2016-09-22 DIAGNOSIS — F1014 Alcohol abuse with alcohol-induced mood disorder: Secondary | ICD-10-CM | POA: Diagnosis present

## 2016-09-22 DIAGNOSIS — Z79899 Other long term (current) drug therapy: Secondary | ICD-10-CM | POA: Insufficient documentation

## 2016-09-22 DIAGNOSIS — F10929 Alcohol use, unspecified with intoxication, unspecified: Secondary | ICD-10-CM | POA: Insufficient documentation

## 2016-09-22 LAB — CBC WITH DIFFERENTIAL/PLATELET
BASOS PCT: 0 %
Basophils Absolute: 0 10*3/uL (ref 0.0–0.1)
EOS ABS: 0 10*3/uL (ref 0.0–0.7)
Eosinophils Relative: 0 %
HCT: 41.4 % (ref 39.0–52.0)
HEMOGLOBIN: 14.3 g/dL (ref 13.0–17.0)
Lymphocytes Relative: 25 %
Lymphs Abs: 1.6 10*3/uL (ref 0.7–4.0)
MCH: 32.4 pg (ref 26.0–34.0)
MCHC: 34.5 g/dL (ref 30.0–36.0)
MCV: 93.9 fL (ref 78.0–100.0)
Monocytes Absolute: 0.6 10*3/uL (ref 0.1–1.0)
Monocytes Relative: 9 %
NEUTROS PCT: 66 %
Neutro Abs: 4.2 10*3/uL (ref 1.7–7.7)
Platelets: 218 10*3/uL (ref 150–400)
RBC: 4.41 MIL/uL (ref 4.22–5.81)
RDW: 14.3 % (ref 11.5–15.5)
WBC: 6.5 10*3/uL (ref 4.0–10.5)

## 2016-09-22 LAB — COMPREHENSIVE METABOLIC PANEL
ALT: 172 U/L — AB (ref 17–63)
AST: 153 U/L — ABNORMAL HIGH (ref 15–41)
Albumin: 4.2 g/dL (ref 3.5–5.0)
Alkaline Phosphatase: 47 U/L (ref 38–126)
Anion gap: 12 (ref 5–15)
BUN: 12 mg/dL (ref 6–20)
CHLORIDE: 105 mmol/L (ref 101–111)
CO2: 25 mmol/L (ref 22–32)
CREATININE: 0.73 mg/dL (ref 0.61–1.24)
Calcium: 8.7 mg/dL — ABNORMAL LOW (ref 8.9–10.3)
GFR calc Af Amer: >60 mL/min (ref >60)
Glucose, Bld: 102 mg/dL — ABNORMAL HIGH (ref 65–99)
Potassium: 4.1 mmol/L (ref 3.5–5.1)
Sodium: 142 mmol/L (ref 135–145)
Total Bilirubin: 0.5 mg/dL (ref 0.3–1.2)
Total Protein: 7.3 g/dL (ref 6.5–8.1)

## 2016-09-22 LAB — AMMONIA

## 2016-09-22 LAB — RAPID URINE DRUG SCREEN, HOSP PERFORMED
Amphetamines: NOT DETECTED
Barbiturates: NOT DETECTED
Benzodiazepines: NOT DETECTED
COCAINE: NOT DETECTED
OPIATES: NOT DETECTED
TETRAHYDROCANNABINOL: NOT DETECTED

## 2016-09-22 LAB — SALICYLATE LEVEL: Salicylate Lvl: <7 mg/dL (ref 2.8–30.0)

## 2016-09-22 LAB — ACETAMINOPHEN LEVEL: Acetaminophen (Tylenol), Serum: <10 ug/mL — ABNORMAL LOW (ref 10–30)

## 2016-09-22 LAB — ETHANOL
Alcohol, Ethyl (B): 336 mg/dL (ref <5)
Alcohol, Ethyl (B): 200 mg/dL — ABNORMAL HIGH (ref <5)

## 2016-09-22 MED ORDER — LORAZEPAM 1 MG PO TABS: 0.0000 mg | ORAL_TABLET | Freq: Two times a day (BID) | ORAL | Status: DC

## 2016-09-22 MED ORDER — ONDANSETRON 4 MG PO TBDP: 4.0000 mg | ORAL_TABLET | Freq: Four times a day (QID) | ORAL | Status: DC | PRN

## 2016-09-22 MED ORDER — LORAZEPAM 2 MG/ML IJ SOLN
2.0000 mg | Freq: Four times a day (QID) | INTRAMUSCULAR | Status: DC
  Administered 2016-09-22: 2 mg via INTRAMUSCULAR
  Filled 2016-09-22: qty 1

## 2016-09-22 MED ORDER — VITAMIN B-1 100 MG PO TABS
100.0000 mg | ORAL_TABLET | Freq: Every day | ORAL | Status: DC
  Administered 2016-09-23 (×2): 100 mg via ORAL

## 2016-09-22 MED ORDER — THIAMINE HCL 100 MG/ML IJ SOLN: 100.0000 mg | Freq: Once | INTRAMUSCULAR | Status: DC

## 2016-09-22 MED ORDER — LORAZEPAM 1 MG PO TABS
1.0000 mg | ORAL_TABLET | Freq: Three times a day (TID) | ORAL | Status: AC
  Administered 2016-09-23 (×3): 1 mg via ORAL
  Filled 2016-09-22 (×3): qty 1

## 2016-09-22 MED ORDER — LOPERAMIDE HCL 2 MG PO CAPS: 2.0000 mg | ORAL_CAPSULE | ORAL | Status: DC | PRN

## 2016-09-22 MED ORDER — CLONIDINE HCL 0.1 MG PO TABS
0.1000 mg | ORAL_TABLET | Freq: Once | ORAL | Status: AC
  Administered 2016-09-22: 0.1 mg via ORAL
  Filled 2016-09-22: qty 1

## 2016-09-22 MED ORDER — FLUOXETINE HCL 10 MG PO CAPS
10.0000 mg | ORAL_CAPSULE | Freq: Every day | ORAL | Status: DC
  Administered 2016-09-22 – 2016-09-23 (×2): 10 mg via ORAL
  Filled 2016-09-22 (×2): qty 1

## 2016-09-22 MED ORDER — GABAPENTIN 300 MG PO CAPS
300.0000 mg | ORAL_CAPSULE | Freq: Two times a day (BID) | ORAL | Status: DC
  Administered 2016-09-22 – 2016-09-23 (×4): 300 mg via ORAL
  Filled 2016-09-22 (×4): qty 1

## 2016-09-22 MED ORDER — LORAZEPAM 1 MG PO TABS
1.0000 mg | ORAL_TABLET | Freq: Once | ORAL | Status: AC
  Administered 2016-09-22: 1 mg via ORAL
  Filled 2016-09-22: qty 1

## 2016-09-22 MED ORDER — LORAZEPAM 1 MG PO TABS
1.0000 mg | ORAL_TABLET | Freq: Every day | ORAL | Status: AC
  Administered 2016-09-23: 1 mg via ORAL

## 2016-09-22 MED ORDER — LORAZEPAM 1 MG PO TABS
1.0000 mg | ORAL_TABLET | Freq: Four times a day (QID) | ORAL | Status: AC
  Administered 2016-09-22 (×4): 1 mg via ORAL
  Filled 2016-09-22 (×4): qty 1

## 2016-09-22 MED ORDER — ALUM & MAG HYDROXIDE-SIMETH 200-200-20 MG/5ML PO SUSP: 30.0000 mL | Freq: Four times a day (QID) | ORAL | Status: DC | PRN

## 2016-09-22 MED ORDER — LORAZEPAM 1 MG PO TABS
0.0000 mg | ORAL_TABLET | Freq: Four times a day (QID) | ORAL | Status: DC
  Administered 2016-09-22: 1 mg via ORAL
  Filled 2016-09-22: qty 1

## 2016-09-22 MED ORDER — LORAZEPAM 1 MG PO TABS: 1.0000 mg | ORAL_TABLET | Freq: Four times a day (QID) | ORAL | Status: DC | PRN

## 2016-09-22 MED ORDER — HYDROXYZINE HCL 25 MG PO TABS
25.0000 mg | ORAL_TABLET | Freq: Four times a day (QID) | ORAL | Status: DC | PRN
  Administered 2016-09-22 – 2016-09-23 (×3): 25 mg via ORAL
  Filled 2016-09-22 (×3): qty 1

## 2016-09-22 MED ORDER — LORAZEPAM 1 MG PO TABS: 1.0000 mg | ORAL_TABLET | Freq: Two times a day (BID) | ORAL | Status: DC

## 2016-09-22 MED ORDER — ONDANSETRON HCL 4 MG PO TABS: 4.0000 mg | ORAL_TABLET | Freq: Three times a day (TID) | ORAL | Status: DC | PRN

## 2016-09-22 MED ORDER — LORAZEPAM 2 MG/ML IJ SOLN: 0.0000 mg | Freq: Four times a day (QID) | INTRAMUSCULAR | Status: DC

## 2016-09-22 MED ORDER — LORAZEPAM 2 MG/ML IJ SOLN: 0.0000 mg | Freq: Two times a day (BID) | INTRAMUSCULAR | Status: DC

## 2016-09-22 MED ORDER — VITAMIN B-1 100 MG PO TABS
100.0000 mg | ORAL_TABLET | Freq: Every day | ORAL | Status: DC
  Administered 2016-09-22 – 2016-09-23 (×2): 100 mg via ORAL
  Filled 2016-09-22 (×2): qty 1

## 2016-09-22 MED ORDER — LORAZEPAM 2 MG/ML IJ SOLN
2.0000 mg | Freq: Once | INTRAMUSCULAR | Status: AC
  Administered 2016-09-22: 2 mg via INTRAMUSCULAR
  Filled 2016-09-22: qty 1

## 2016-09-22 MED ORDER — THIAMINE HCL 100 MG/ML IJ SOLN: 100.0000 mg | Freq: Every day | INTRAMUSCULAR | Status: DC

## 2016-09-22 MED ORDER — LORAZEPAM 2 MG/ML IJ SOLN: 2.0000 mg | Freq: Four times a day (QID) | INTRAMUSCULAR | Status: DC | PRN

## 2016-09-22 MED ORDER — THIAMINE HCL 100 MG/ML IJ SOLN
Freq: Once | INTRAVENOUS | Status: AC
  Administered 2016-09-22: 02:00:00 via INTRAVENOUS
  Filled 2016-09-22 (×2): qty 1000

## 2016-09-22 MED ORDER — ADULT MULTIVITAMIN W/MINERALS CH
1.0000 | ORAL_TABLET | Freq: Every day | ORAL | Status: DC
  Administered 2016-09-22 – 2016-09-23 (×2): 1 via ORAL
  Filled 2016-09-22 (×2): qty 1

## 2016-09-22 MED ORDER — SODIUM CHLORIDE 0.9 % IV BOLUS (SEPSIS)
1000.0000 mL | Freq: Once | INTRAVENOUS | Status: AC
  Administered 2016-09-22: 1000 mL via INTRAVENOUS

## 2016-09-22 NOTE — BH Assessment (Addendum)
Assessment Note  Alex Logan is an 51 y.o. male that presents this date under IVC. Per IVC: "Respondent stated he will shoot himself in the head in the presence of the officer. He made an attempt to reach for the gun in the officer's presence." Patient cannot confirm any of the above stating "he was really drunk." Patient denies any S/I, H/I or AVH this date. Patient is time/place oriented and speaks in a low/soft voice. This Clinical research associate observes that patient is displaying tremors of both hands and finds it difficult to recall the events that occurred in the last 24 hours. Patient states he consumes over 1 pint of liquor daily with his last use on 09/21/16 when patient stated he started drinking "early in the morning" and "just didn't stop" not being able to recall the events that transpired. Patient denies any other illicit SA use. Patient renders limited history and is vague in reference to the incident that occurred earlier this date. Patient reports excessive ETOH use for over 15 years and states he has attempted to maintain his sobriety in the past but has limited support. Patient denies any OP history and states "I have just tried to stop on my own." Patient reports current withdrawal symptoms to include tremors, agitation and nausea.  Per note review, patient had a weapon at his residence that he threatened to use for self harm. Patient stated he feels that weapon has been secured by a family member. Patient does admit to ongoing depression with symptoms to include: feeling worthless and guilt over his excessive ETOH use. Patient states he currently receives services from [place] MD who prescribes a anti-depressant (Prozac). Patient denies any previous attempts/gestures at self harm. Patient states he is currently complaint with that medication but reports ongoing symptoms that he feels is associated with excessive ETOH use. Patient denies any other OP services or prior inpatient admissions. Patient reports  current withdrawals to include agitation, tremors and nausea. Patient renders limited history and is vague in reference to details. Case was staffed with Jannifer Franklin MD, Cresenciano Genre who recommended patient  be re-evaluated in the a.m.                    Diagnosis: MDD recurrent without psychotic features, severe ETOH use severe  Past Medical History: History reviewed. No pertinent past medical history.  History reviewed. No pertinent surgical history.  Family History: No family history on file.  Social History:  reports that he has never smoked. He has never used smokeless tobacco. He reports that he drinks alcohol. He reports that he does not use drugs.  Additional Social History:  Alcohol / Drug Use Pain Medications: See MAR Prescriptions: See MAR Over the Counter: See MAR History of alcohol / drug use?: Yes Longest period of sobriety (when/how long): 1 year 2015 Negative Consequences of Use: Personal relationships, Legal Withdrawal Symptoms:  (Denies) Substance #1 Name of Substance 1: ETOH 1 - Age of First Use: 26 1 - Amount (size/oz): 1 pint 1 - Frequency: Daily 1 - Duration: Last 12 years 1 - Last Use / Amount: 09/21/16 1 pint  CIWA: CIWA-Ar BP: (!) 165/102 Pulse Rate: (!) 108 Nausea and Vomiting: no nausea and no vomiting Tactile Disturbances: very mild itching, pins and needles, burning or numbness Tremor: three Auditory Disturbances: not present Paroxysmal Sweats: barely perceptible sweating, palms moist Visual Disturbances: not present Anxiety: mildly anxious Headache, Fullness in Head: none present Agitation: normal activity Orientation and Clouding of Sensorium: oriented and  can do serial additions CIWA-Ar Total: 6 COWS:    Allergies: No Known Allergies  Home Medications:  (Not in a hospital admission)  OB/GYN Status:  No LMP for male patient.  General Assessment Data Location of Assessment: [facility] TTS Assessment: In system Is this a Tele or Face-to-Face  Assessment?: Face-to-Face Is this an Initial Assessment or a Re-assessment for this encounter?: Initial Assessment Marital status: Single Maiden name: NA Is patient pregnant?: No Pregnancy Status: No Living Arrangements: Alone Can pt return to current living arrangement?: Yes Admission Status: Involuntary Is patient capable of signing voluntary admission?: Yes Referral Source: Self/Family/Friend Insurance type: Self pay  Medical Screening Exam [facility] Walk-in ONLY) Medical Exam completed: Yes  Crisis Care Plan Living Arrangements: Alone Legal Guardian:  (NA) Name of Psychiatrist: Evelene Croon MD Name of Therapist: None  Education Status Is patient currently in school?: No Current Grade: NA Highest grade of school patient has completed:  (12) Name of school: NA Contact person: NA  Risk to self with the past 6 months Suicidal Ideation: Yes-Currently Present Has patient been a risk to self within the past 6 months prior to admission? : No Suicidal Intent: Yes-Currently Present (Per IVC) Has patient had any suicidal intent within the past 6 months prior to admission? : No Is patient at risk for suicide?: Yes Suicidal Plan?: Yes-Currently Present Has patient had any suicidal plan within the past 6 months prior to admission? : No Specify Current Suicidal Plan: Per notes patient has a firearm Access to Means: Yes (Per [employer] pt was in possession of firearm) Specify Access to Suicidal Means: Pt had a firearm at his residence What has been your use of drugs/alcohol within the last 12 months?: Current use Previous Attempts/Gestures: No How many times?: 0 Other Self Harm Risks: Unknown Triggers for Past Attempts: Unknown Intentional Self Injurious Behavior: None Family Suicide History: No Recent stressful life event(s): Other (Comment) (Excessive SA use) Persecutory voices/beliefs?: No Depression: Yes Depression Symptoms: Feeling worthless/self pity Substance abuse history and/or treatment  for substance abuse?: Yes Suicide prevention information given to non-admitted patients: Not applicable  Risk to Others within the past 6 months Homicidal Ideation: No Does patient have any lifetime risk of violence toward others beyond the six months prior to admission? : No Thoughts of Harm to Others: No Current Homicidal Intent: No Current Homicidal Plan: No Access to Homicidal Means: No Identified Victim: NA History of harm to others?: No Assessment of Violence: None Noted Violent Behavior Description: NA Does patient have access to weapons?: Yes (Comment) Criminal Charges Pending?: Yes (Per [employer] pt had a firearm at residence) Describe Pending Criminal Charges: DUI Does patient have a court date: No Is patient on probation?: No  Psychosis Hallucinations: None noted Delusions: None noted  Mental Status Report Appearance/Hygiene: In scrubs Eye Contact: Fair Motor Activity: Tremors Speech: Soft, Slow Level of Consciousness: Drowsy Mood: Pleasant Affect: Depressed Anxiety Level: Minimal Thought Processes: Coherent Judgement: Partial Orientation: Person, Place, Time Obsessive Compulsive Thoughts/Behaviors: None  Cognitive Functioning Concentration: Decreased Memory: Recent Impaired IQ: Average Insight: Poor Impulse Control: Poor Appetite: Fair Weight Loss: 0 Weight Gain: 0 Sleep: Decreased Total Hours of Sleep: 6 Vegetative Symptoms: None  ADLScreening [facility]) Patient's cognitive ability adequate to safely complete daily activities?: Yes Patient able to express need for assistance with ADLs?: Yes Independently performs ADLs?: Yes (appropriate for developmental age)  Prior Inpatient Therapy Prior Inpatient Therapy: No Prior Therapy Dates: NA Prior Therapy Facilty/Provider(s): NA Reason for Treatment: NA  Prior Outpatient Therapy Prior Outpatient Therapy: No Prior Therapy Dates: NA Prior Therapy Facilty/Provider(s): NA Reason for  Treatment: NA Does patient have an ACCT team?: No Does patient have Intensive In-House Services?  : No Does patient have [facility] services? : No Does patient have P4CC services?: No  ADL Screening (condition at time of admission) Patient's cognitive ability adequate to safely complete daily activities?: Yes Is the patient deaf or have difficulty hearing?: No Does the patient have difficulty seeing, even when wearing glasses/contacts?: No Does the patient have difficulty concentrating, remembering, or making decisions?: No Patient able to express need for assistance with ADLs?: Yes Does the patient have difficulty dressing or bathing?: No Independently performs ADLs?: Yes (appropriate for developmental age) Does the patient have difficulty walking or climbing stairs?: No Weakness of Legs: None Weakness of Arms/Hands: None  Home Assistive Devices/Equipment Home Assistive Devices/Equipment: None  Therapy Consults (therapy consults require a physician order) PT Evaluation Needed: No OT Evalulation Needed: No SLP Evaluation Needed: No Abuse/Neglect Assessment (Assessment to be complete while patient is alone) Physical Abuse: Denies Verbal Abuse: Denies Sexual Abuse: Denies Exploitation of patient/patient's resources: Denies Self-Neglect: Denies Values / Beliefs Cultural Requests During Hospitalization: None Spiritual Requests During Hospitalization: None Consults Spiritual Care Consult Needed: No Social Work Consult Needed: No Merchant navy officer (For Healthcare) Does Patient Have a Medical Advance Directive?: No Would patient like information on creating a medical advance directive?: No - Patient declined    Additional Information 1:1 In Past 12 Months?: No CIRT Risk: No Elopement Risk: No Does patient have medical clearance?: Yes     Disposition: Case was staffed with Jannifer Franklin MD, Shaune Pollack DNP who recommended patient  be re-evaluated in the a.m. Disposition Initial  Assessment Completed for this Encounter: Yes Disposition of Patient: Other dispositions Other disposition(s): Other (Comment) (Pt will be re-evaluated in the a.m.)  On Site Evaluation by:   Reviewed with Physician:    Alfredia Ferguson 09/22/2016 12:07 PM

## 2016-09-22 NOTE — ED Notes (Signed)
Hourly rounding reveals patient in room. No complaints, in no acute distress. Q15 minute rounds and monitoring via Security Cameras to continue.  

## 2016-09-22 NOTE — ED Notes (Signed)
Hourly rounding reveals patient sleeping in room. No complaints, in no acute distress. Q15 minute rounds and monitoring via [employer] to continue.

## 2016-09-22 NOTE — ED Notes (Signed)
Bed: [facility] Expected date:  Expected time:  Means of arrival:  Comments: [employer]- IVC 

## 2016-09-22 NOTE — ED Notes (Signed)
Pt. Up to nurses station to ask to go home. Pt. Informed about the IVC in place for him. Pt. Having some balance problems. Pt. Assisted to room to bed.

## 2016-09-22 NOTE — ED Notes (Signed)
Reviewed AA and [place] resources with patient and GF.  Explained POC.  Support and encouragement offered. 15' checks cont for safety.

## 2016-09-22 NOTE — ED Notes (Addendum)
172 /104 manual. Reported to EDP.

## 2016-09-22 NOTE — ED Notes (Signed)
Bed: [facility]26 Expected date:  Expected time:  Means of arrival:  Comments: 

## 2016-09-22 NOTE — ED Notes (Signed)
Consulted with Dr. Effie Shy reference patients elevated BP and what seems to be an  increase in his level of confusion and more of a problem keeping his balance since earlier today. Dr. Effie Shy wants to maintain current treatment and to be notified of any changes.

## 2016-09-22 NOTE — ED Triage Notes (Signed)
Accompanied by [employer]  Pt. Was picked up from his apartment  At 1115 this evening ,IVC , admitted of being suicidal . [employer] found riffle in pt.'s house, pt. Called his father at told him he is going to kill himself. Etoh, cooperative upon arrival to ED , ambulatory.

## 2016-09-22 NOTE — ED Provider Notes (Signed)
17:30-I was asked to assess the patient, by nursing for possible DTs.  Nursing is concerned that his blood pressure has elevated and his mental status has declined.  The patient is alert and lucid, and conversant.  He reports he has high blood pressure and typically treats it with "fish oil."  He is alert and oriented 3.  There is no tremor.  There is no asterixis.  There is no dysarthria, aphasia or nystagmus.  He walks with a semi-unsteady gait, and was able to correct gait deviation, on his own, without falling.  Nursing asked to perform manual blood pressure.  Blood pressure 172/104, manually.  Medical decision making-17: 47-hypertension, without signs of significant alcohol withdrawal syndrome, or delirium tremens.  Patient with suicidal ideation will require ongoing observation and management.  There is no indication for acute lowering of blood pressure at this time.  Plan-continue current management with as needed benzodiazepine, and clonidine, with ongoing assessment using CIWA scores, and face-to-face evaluations.   Mancel Bale, MD 09/22/16 [phone]

## 2016-09-22 NOTE — BH Assessment (Signed)
[facility] Assessment Progress Note Case was staffed with Jannifer Franklin MD, Cresenciano Genre who recommended patient  be re-evaluated in the a.m.

## 2016-09-22 NOTE — ED Notes (Signed)
Bed: [facility] Expected date:  Expected time:  Means of arrival:  Comments: Hold for 26 

## 2016-09-22 NOTE — ED Notes (Signed)
Report to include Situation, Background, Assessment, and Recommendations received from [facility]. Patient disoriented to time and place, warm and dry. Patient denies SI, HI, AVH and pain. Patient made aware of Q15 minute rounds and security cameras for their safety. Patient instructed to come to me with needs or concerns.

## 2016-09-22 NOTE — ED Provider Notes (Signed)
[facility]-EMERGENCY DEPT Provider Note   CSN: [id number] Arrival date & time: 09/22/16  0001     History   Chief Complaint Chief Complaint  Patient presents with  . Suicidal  . IVC  . Alcohol Intoxication    HPI AGAMJOT KILGALLON is a 51 y.o. male.  The history is provided by the patient and the police.  Alcohol Intoxication  This is a recurrent problem. The current episode started yesterday. The problem occurs constantly. The problem has not changed since onset.Pertinent negatives include no chest pain and no abdominal pain. Nothing aggravates the symptoms. Nothing relieves the symptoms. He has tried nothing for the symptoms. The treatment provided no relief.  Mental Health Problem  Presenting symptoms: suicidal thoughts and suicidal threats   Patient accompanied by:  Law enforcement Degree of incapacity (severity):  Severe Onset quality:  Unable to specify Timing:  Constant Progression:  Unchanged Chronicity:  New Context: alcohol use   Treatment compliance:  Untreated Relieved by:  Nothing Worsened by:  Nothing Ineffective treatments:  None tried Associated symptoms: no abdominal pain and no chest pain   Risk factors: no family hx of mental illness   Under IVC for threatening to kill self with a gun.  IVC taken out by police, weapon reportedly confiscated.    History reviewed. No pertinent past medical history.  There are no active problems to display for this patient.   History reviewed. No pertinent surgical history.     Home Medications    Prior to Admission medications   Medication Sig Start Date End Date Taking? Authorizing Provider  escitalopram (LEXAPRO) 20 MG tablet Take 20 mg by mouth daily. 11/17/12   [provider]  meclizine (ANTIVERT) 25 MG tablet Take 1 tablet (25 mg total) by mouth 3 (three) times daily as needed for dizziness. Patient not taking: Reported on 11/02/2015 12/06/12   Bridgett Larsson, MD    Family History No family history on  file.  Social History Social History  Substance Use Topics  . Smoking status: Never Smoker  . Smokeless tobacco: Never Used  . Alcohol use Yes     Comment: vodka every other day     Allergies   Patient has no known allergies.   Review of Systems Review of Systems  Constitutional: Negative for fever.  Cardiovascular: Negative for chest pain.  Gastrointestinal: Negative for abdominal pain.  Genitourinary: Negative for genital sores.  Psychiatric/Behavioral: Positive for suicidal ideas.  All other systems reviewed and are negative.    Physical Exam Updated Vital Signs BP (!) 158/105 (BP Location: Left Arm)   Pulse (!) 129   Temp 98.2 F (36.8 C) (Oral)   Resp (!) 8   SpO2 98%   Physical Exam  Constitutional: He is oriented to person, place, and time. He appears well-developed and well-nourished. No distress.  HENT:  Head: Normocephalic and atraumatic.  Mouth/Throat: Oropharynx is clear and moist.  Eyes: Pupils are equal, round, and reactive to light. Conjunctivae and EOM are normal.  Neck: Normal range of motion. Neck supple.  Cardiovascular: Regular rhythm, normal heart sounds and intact distal pulses.  Tachycardia present.   Pulmonary/Chest: Effort normal and breath sounds normal. He has no wheezes. He has no rales.  Abdominal: Soft. Bowel sounds are normal. He exhibits no mass. There is no tenderness. There is no rebound and no guarding.  Musculoskeletal: Normal range of motion.  Neurological: He is alert and oriented to person, place, and time.  Skin: Skin is warm and  dry. Capillary refill takes less than 2 seconds.  Psychiatric: He has a normal mood and affect.     ED Treatments / Results   Vitals:   09/22/16 0303 09/22/16 0443  BP: (!) 150/92 (!) 165/102  Pulse: (!) 110 (!) 108  Resp: 18 18  Temp:    SpO2: 97% 98%    Labs (all labs ordered are listed, but only abnormal results are displayed)  Results for orders placed or performed during the  hospital encounter of 09/22/16  Comprehensive metabolic panel  Result Value Ref Range   Sodium 142 135 - 145 mmol/L   Potassium 4.1 3.5 - 5.1 mmol/L   Chloride 105 101 - 111 mmol/L   CO2 25 22 - 32 mmol/L   Glucose, Bld 102 (H) 65 - 99 mg/dL   BUN 12 6 - 20 mg/dL   Creatinine, Ser 1.61 0.61 - 1.24 mg/dL   Calcium 8.7 (L) 8.9 - 10.3 mg/dL   Total Protein 7.3 6.5 - 8.1 g/dL   Albumin 4.2 3.5 - 5.0 g/dL   AST 096 (H) 15 - 41 U/L   ALT 172 (H) 17 - 63 U/L   Alkaline Phosphatase 47 38 - 126 U/L   Total Bilirubin 0.5 0.3 - 1.2 mg/dL   GFR calc non Af Amer >60 >60 mL/min   GFR calc Af Amer >60 >60 mL/min   Anion gap 12 5 - 15  Ethanol  Result Value Ref Range   Alcohol, Ethyl (B) 336 (HH) <5 mg/dL  Urine rapid drug screen (hosp performed)  Result Value Ref Range   Opiates NONE DETECTED NONE DETECTED   Cocaine NONE DETECTED NONE DETECTED   Benzodiazepines NONE DETECTED NONE DETECTED   Amphetamines NONE DETECTED NONE DETECTED   Tetrahydrocannabinol NONE DETECTED NONE DETECTED   Barbiturates NONE DETECTED NONE DETECTED  CBC with Diff  Result Value Ref Range   WBC 6.5 4.0 - 10.5 K/uL   RBC 4.41 4.22 - 5.81 MIL/uL   Hemoglobin 14.3 13.0 - 17.0 g/dL   HCT 04.5 40.9 - 81.1 %   MCV 93.9 78.0 - 100.0 fL   MCH 32.4 26.0 - 34.0 pg   MCHC 34.5 30.0 - 36.0 g/dL   RDW 91.4 78.2 - 95.6 %   Platelets 218 150 - 400 K/uL   Neutrophils Relative % 66 %   Neutro Abs 4.2 1.7 - 7.7 K/uL   Lymphocytes Relative 25 %   Lymphs Abs 1.6 0.7 - 4.0 K/uL   Monocytes Relative 9 %   Monocytes Absolute 0.6 0.1 - 1.0 K/uL   Eosinophils Relative 0 %   Eosinophils Absolute 0.0 0.0 - 0.7 K/uL   Basophils Relative 0 %   Basophils Absolute 0.0 0.0 - 0.1 K/uL  Acetaminophen level  Result Value Ref Range   Acetaminophen (Tylenol), Serum <10 (L) 10 - 30 ug/mL  Salicylate level  Result Value Ref Range   Salicylate Lvl <7.0 2.8 - 30.0 mg/dL  Ethanol  Result Value Ref Range   Alcohol, Ethyl (B) 200 (H) <5 mg/dL    No results found.  Radiology No results found.  Procedures Procedures (including critical care time)  Medications Ordered in ED Medications  LORazepam (ATIVAN) injection 0-4 mg (not administered)    Or  LORazepam (ATIVAN) tablet 0-4 mg (not administered)  LORazepam (ATIVAN) injection 0-4 mg (not administered)    Or  LORazepam (ATIVAN) tablet 0-4 mg (not administered)  thiamine (VITAMIN B-1) tablet 100 mg (not administered)    Or  thiamine (B-1) injection 100 mg (not administered)  ondansetron (ZOFRAN) tablet 4 mg (not administered)  alum & mag hydroxide-simeth (MAALOX/MYLANTA) 200-200-20 MG/5ML suspension 30 mL (not administered)  sodium chloride 0.9 % bolus 1,000 mL (0 mLs Intravenous Stopped 09/22/16 0222)  sodium chloride 0.9 % 1,000 mL with thiamine 100 mg, folic acid 1 mg, multivitamins adult 10 mL, magnesium sulfate 2 g infusion ( Intravenous New Bag/Given 09/22/16 0223)      Final Clinical Impressions(s) / ED Diagnoses  Under IVC for suicide threats:  Is awake and alert and cleared for psychiatry    Azriel Dancy, MD 09/23/16 0104

## 2016-09-22 NOTE — Progress Notes (Signed)
09/22/16 1333:  Pt was with visitor in dayroom.   Caroll Rancher, LRT/CTRS

## 2016-09-22 NOTE — ED Notes (Addendum)
Confused and disoriented.  Unsteady ambulation. Gait assistance needed.  Observed by oncoming RN. Fluids given.

## 2016-09-22 NOTE — ED Notes (Signed)
Provider informed of updated VS. New orders received and implemented.

## 2016-09-22 NOTE — ED Notes (Signed)
Hourly rounding reveals patient sleeping in room. No complaints noted. Q15 minute rounds and monitoring via [employer] to continue.

## 2016-09-22 NOTE — ED Notes (Signed)
Hourly rounding reveals patient in hall. No complaints, stable, in no acute distress. Q15 minute rounds and monitoring via Security Cameras to continue. 

## 2016-09-22 NOTE — ED Notes (Signed)
Provider informed of VS.  Extra  dose of Ativan administered per order.  Fluids given encouraged bedrest.  15' checks cont for safety.

## 2016-09-23 MED ORDER — CLONIDINE HCL 0.1 MG PO TABS
0.1000 mg | ORAL_TABLET | Freq: Once | ORAL | Status: AC
  Administered 2016-09-23: 0.1 mg via ORAL
  Filled 2016-09-23: qty 1

## 2016-09-23 MED ORDER — OMEGA-3-ACID ETHYL ESTERS 1 G PO CAPS
1.0000 g | ORAL_CAPSULE | Freq: Two times a day (BID) | ORAL | Status: DC
  Administered 2016-09-23 (×2): 1 g via ORAL
  Filled 2016-09-23 (×3): qty 1

## 2016-09-23 MED ORDER — LISINOPRIL 10 MG PO TABS
10.0000 mg | ORAL_TABLET | Freq: Every day | ORAL | Status: DC
  Administered 2016-09-23: 10 mg via ORAL
  Filled 2016-09-23 (×2): qty 1

## 2016-09-23 NOTE — ED Notes (Signed)
Report to include Situation, Background, Assessment, and Recommendations received from LuAnn RN. Patient alert and oriented, warm and dry, in no acute distress. Patient denies SI, HI, AVH and pain. Patient made aware of Q15 minute rounds and security cameras for their safety. Patient instructed to come to me with needs or concerns.  

## 2016-09-23 NOTE — ED Notes (Signed)
Hourly rounding reveals patient sleeping in room. No complaints, stable, in no acute distress. Q15 minute rounds and monitoring via Security Cameras to continue. 

## 2016-09-23 NOTE — ED Notes (Signed)
Report BP to [facility] AC. 

## 2016-09-23 NOTE — BH Assessment (Signed)
[facility] Assessment Progress Note Patient has been accepted to [facility]. Status pending.

## 2016-09-23 NOTE — ED Notes (Signed)
Pt. Up to nurses station somewhat anxious about not sleeping and his blood pressure.

## 2016-09-23 NOTE — ED Notes (Signed)
Hourly rounding reveals patient in room. Pt. stable, in no acute distress. Q15 minute rounds and monitoring via [employer] to continue.

## 2016-09-23 NOTE — ED Notes (Signed)
Updated Nira Conn NP on patients condition including vital signs.

## 2016-09-23 NOTE — ED Notes (Signed)
Hourly rounding reveals patient in room. No complaints, stable, in no acute distress. Q15 minute rounds and monitoring via Security [employer] to continue. 

## 2016-09-23 NOTE — BH Assessment (Signed)
[facility] Assessment Progress Note This Clinical research associate spoke with patient this date to discuss treatment progress. This Clinical research associate observed that patient continues to be in active withdrawals as evidenced by patient exhibiting hand tremors and gait is observed to be unsteady as patient walks back to his room to speak to this Clinical research associate. This Clinical research associate and patient discuss patient's treatment plan with patient stating he feels he needs continued inpatient monitoring.  Patient speaks is a soft voice and is tearful as he interacts with this Clinical research associate. Patient states "I have to get my life back together before it is to late." Patient states "I know I am close to the end if I don't get this drinking under control." Patient states he continues to have withdrawals to include nausea, tremors and racing thoughts. Patient states he continues to have thoughts of dying due to his excessive SA use. Patient's case was staffed with Akintayo MD who recommended continued inapatient monitoring. Patient has been accepted to [facility] later this date. Bed assignment pending.

## 2016-09-23 NOTE — ED Notes (Signed)
Medication education complete. Encouraged fluids.

## 2016-09-24 ENCOUNTER — Encounter (HOSPITAL_COMMUNITY): Payer: Self-pay | Admitting: *Deleted

## 2016-09-24 ENCOUNTER — Inpatient Hospital Stay (HOSPITAL_COMMUNITY)
Admission: AD | Admit: 2016-09-24 | Discharge: 2016-09-27 | DRG: 897 | Disposition: A | Discharge status: Home / Self Care | Payer: Federal, State, Local not specified - Other | Attending: Psychiatry | Admitting: Psychiatry

## 2016-09-24 DIAGNOSIS — Z23 Encounter for immunization: Secondary | ICD-10-CM | POA: Diagnosis not present

## 2016-09-24 DIAGNOSIS — F1020 Alcohol dependence, uncomplicated: Secondary | ICD-10-CM | POA: Diagnosis present

## 2016-09-24 DIAGNOSIS — F329 Major depressive disorder, single episode, unspecified: Secondary | ICD-10-CM | POA: Diagnosis present

## 2016-09-24 DIAGNOSIS — F10230 Alcohol dependence with withdrawal, uncomplicated: Secondary | ICD-10-CM | POA: Diagnosis present

## 2016-09-24 DIAGNOSIS — F1014 Alcohol abuse with alcohol-induced mood disorder: Secondary | ICD-10-CM | POA: Diagnosis not present

## 2016-09-24 DIAGNOSIS — F419 Anxiety disorder, unspecified: Secondary | ICD-10-CM | POA: Diagnosis present

## 2016-09-24 DIAGNOSIS — F1021 Alcohol dependence, in remission: Secondary | ICD-10-CM | POA: Diagnosis present

## 2016-09-24 DIAGNOSIS — Z79899 Other long term (current) drug therapy: Secondary | ICD-10-CM | POA: Diagnosis not present

## 2016-09-24 DIAGNOSIS — Y908 Blood alcohol level of 240 mg/100 ml or more: Secondary | ICD-10-CM | POA: Diagnosis present

## 2016-09-24 DIAGNOSIS — F1024 Alcohol dependence with alcohol-induced mood disorder: Principal | ICD-10-CM | POA: Diagnosis present

## 2016-09-24 MED ORDER — LORAZEPAM 1 MG PO TABS
1.0000 mg | ORAL_TABLET | Freq: Four times a day (QID) | ORAL | Status: DC | PRN
  Administered 2016-09-24 – 2016-09-25 (×2): 1 mg via ORAL
  Filled 2016-09-24 (×2): qty 1

## 2016-09-24 MED ORDER — THIAMINE HCL 100 MG/ML IJ SOLN: 100.0000 mg | Freq: Once | INTRAMUSCULAR | Status: DC

## 2016-09-24 MED ORDER — LORAZEPAM 1 MG PO TABS
1.0000 mg | ORAL_TABLET | Freq: Four times a day (QID) | ORAL | Status: AC
  Administered 2016-09-24 (×3): 1 mg via ORAL
  Filled 2016-09-24 (×2): qty 1

## 2016-09-24 MED ORDER — TRAZODONE HCL 50 MG PO TABS
50.0000 mg | ORAL_TABLET | Freq: Every evening | ORAL | Status: DC | PRN
Start: 2016-09-25 — End: 2016-09-27
  Administered 2016-09-25 – 2016-09-26 (×2): 50 mg via ORAL
  Filled 2016-09-24 (×6): qty 1
  Filled 2016-09-24 (×2): qty 14
  Filled 2016-09-24: qty 1

## 2016-09-24 MED ORDER — INFLUENZA VAC SPLIT QUAD 0.5 ML IM SUSY
0.5000 mL | PREFILLED_SYRINGE | INTRAMUSCULAR | Status: AC
  Administered 2016-09-25: 0.5 mL via INTRAMUSCULAR
  Filled 2016-09-24: qty 0.5

## 2016-09-24 MED ORDER — ADULT MULTIVITAMIN W/MINERALS CH
ORAL_TABLET | ORAL | Status: AC
  Filled 2016-09-24: qty 1

## 2016-09-24 MED ORDER — MAGNESIUM HYDROXIDE 400 MG/5ML PO SUSP: 30.0000 mL | Freq: Every day | ORAL | Status: DC | PRN

## 2016-09-24 MED ORDER — HYDROXYZINE HCL 25 MG PO TABS
25.0000 mg | ORAL_TABLET | Freq: Four times a day (QID) | ORAL | Status: AC | PRN
  Administered 2016-09-24 – 2016-09-26 (×4): 25 mg via ORAL
  Filled 2016-09-24 (×4): qty 1

## 2016-09-24 MED ORDER — IBUPROFEN 600 MG PO TABS: 600.0000 mg | ORAL_TABLET | Freq: Four times a day (QID) | ORAL | Status: DC | PRN

## 2016-09-24 MED ORDER — ONDANSETRON 4 MG PO TBDP
4.0000 mg | ORAL_TABLET | Freq: Four times a day (QID) | ORAL | Status: AC | PRN
  Administered 2016-09-24: 4 mg via ORAL
  Filled 2016-09-24: qty 1

## 2016-09-24 MED ORDER — NALTREXONE HCL 50 MG PO TABS
50.0000 mg | ORAL_TABLET | Freq: Every day | ORAL | Status: DC
  Administered 2016-09-24 – 2016-09-27 (×4): 50 mg via ORAL
  Filled 2016-09-24 (×2): qty 1
  Filled 2016-09-24: qty 7
  Filled 2016-09-24 (×4): qty 1

## 2016-09-24 MED ORDER — PNEUMOCOCCAL VAC POLYVALENT 25 MCG/0.5ML IJ INJ
0.5000 mL | INJECTION | INTRAMUSCULAR | Status: AC
  Administered 2016-09-25: 0.5 mL via INTRAMUSCULAR

## 2016-09-24 MED ORDER — LORAZEPAM 1 MG PO TABS: 1.0000 mg | ORAL_TABLET | Freq: Every day | ORAL | Status: DC

## 2016-09-24 MED ORDER — ACETAMINOPHEN 325 MG PO TABS: 650.0000 mg | ORAL_TABLET | Freq: Four times a day (QID) | ORAL | Status: DC | PRN

## 2016-09-24 MED ORDER — ADULT MULTIVITAMIN W/MINERALS CH
1.0000 | ORAL_TABLET | Freq: Every day | ORAL | Status: DC
  Administered 2016-09-24 – 2016-09-27 (×4): 1 via ORAL
  Filled 2016-09-24 (×6): qty 1

## 2016-09-24 MED ORDER — ALUM & MAG HYDROXIDE-SIMETH 200-200-20 MG/5ML PO SUSP
30.0000 mL | ORAL | Status: DC | PRN
  Administered 2016-09-24 – 2016-09-27 (×2): 30 mL via ORAL
  Filled 2016-09-24 (×2): qty 30

## 2016-09-24 MED ORDER — VITAMIN B-1 100 MG PO TABS
100.0000 mg | ORAL_TABLET | Freq: Every day | ORAL | Status: DC
  Administered 2016-09-25 – 2016-09-27 (×3): 100 mg via ORAL
  Filled 2016-09-24 (×5): qty 1

## 2016-09-24 MED ORDER — LORAZEPAM 1 MG PO TABS
ORAL_TABLET | ORAL | Status: AC
  Filled 2016-09-24: qty 1

## 2016-09-24 MED ORDER — ESCITALOPRAM OXALATE 10 MG PO TABS
10.0000 mg | ORAL_TABLET | Freq: Every day | ORAL | Status: DC
  Administered 2016-09-24 – 2016-09-27 (×4): 10 mg via ORAL
  Filled 2016-09-24: qty 7
  Filled 2016-09-24 (×6): qty 1

## 2016-09-24 MED ORDER — ENSURE ENLIVE PO LIQD
237.0000 mL | Freq: Two times a day (BID) | ORAL | Status: DC
  Administered 2016-09-24 – 2016-09-27 (×7): 237 mL via ORAL

## 2016-09-24 MED ORDER — OMEGA-3-ACID ETHYL ESTERS 1 G PO CAPS
1.0000 g | ORAL_CAPSULE | Freq: Two times a day (BID) | ORAL | Status: DC
  Administered 2016-09-24 – 2016-09-27 (×6): 1 g via ORAL
  Filled 2016-09-24 (×4): qty 1
  Filled 2016-09-24: qty 14
  Filled 2016-09-24 (×4): qty 1
  Filled 2016-09-24: qty 14

## 2016-09-24 MED ORDER — LORAZEPAM 1 MG PO TABS
1.0000 mg | ORAL_TABLET | Freq: Three times a day (TID) | ORAL | Status: AC
  Administered 2016-09-25 (×3): 1 mg via ORAL
  Filled 2016-09-24 (×3): qty 1

## 2016-09-24 MED ORDER — LORAZEPAM 1 MG PO TABS
1.0000 mg | ORAL_TABLET | Freq: Two times a day (BID) | ORAL | Status: DC
  Administered 2016-09-26: 1 mg via ORAL
  Filled 2016-09-24 (×2): qty 1

## 2016-09-24 MED ORDER — LOPERAMIDE HCL 2 MG PO CAPS: 2.0000 mg | ORAL_CAPSULE | ORAL | Status: AC | PRN

## 2016-09-24 NOTE — Progress Notes (Signed)
Patient has remained anxious and restless on unit. Frequent requests made of staff. Nervousness observed with some hypervigilance. BP remains elevated as is pulse. CIWA was a "10" at dinner time. Ativan protocol followed, fluids encouraged. Patient remains safe at present.

## 2016-09-24 NOTE — ED Notes (Signed)
Hourly rounding reveals patient sleeping in room. No complaints, stable, in no acute distress. Q15 minute rounds and monitoring via Security Cameras to continue. 

## 2016-09-24 NOTE — Tx Team (Signed)
Initial Treatment Plan 09/24/2016 1030 Alex Logan UEA:[id number]    PATIENT STRESSORS: Loss of relationship (break up with gf) Substance abuse   PATIENT STRENGTHS: Ability for insight Capable of independent living Metallurgist fund of knowledge Motivation for treatment/growth Physical Health Supportive family/friends Work skills   PATIENT IDENTIFIED PROBLEMS:     "To get sober."    "I need to turn to things other than drinking when I'm upset."             DISCHARGE CRITERIA:  Improved stabilization in mood, thinking, and/or behavior Need for constant or close observation no longer present Reduction of life-threatening or endangering symptoms to within safe limits Verbal commitment to aftercare and medication compliance Withdrawal symptoms are absent or subacute and managed without 24-hour nursing intervention  PRELIMINARY DISCHARGE PLAN: Attend 12-step recovery group Outpatient therapy Return to previous living arrangement Return to previous work or school arrangements  PATIENT/FAMILY INVOLVEMENT: This treatment plan has been presented to and reviewed with the patient, Alex Logan, and/or family member.  The patient and family have been given the opportunity to ask questions and make suggestions.  Lawrence Marseilles, RN 09/24/2016, 11:37 AM

## 2016-09-24 NOTE — H&P (Signed)
Psychiatric Admission Assessment Adult  Patient Identification: Alex Logan MRN:  [id number] Date of Evaluation:  09/24/2016 Chief Complaint:  Suicidal behavior Principal Diagnosis: MDD                                        SIMD Diagnosis:   Patient Active Problem List   Diagnosis Date Noted  . Alcohol abuse with alcohol-induced mood disorder [facility]) [F10.14] 09/22/2016   History of Present Illness: 51 y.o Caucasian male, divorced, lives alone, self employed . Known history of AUD. Presented to the ER via the police. His father called and alerted the police after patient threatened to shoot himself. He had a rifle at home and attempted to use it when the police got to his place. Patient was intoxicated with alcohol. BAL was 336 mg/dl. UDS was negative. CMP normal electrolytes but mildly elevated of liver enzymes.   At interview, patient reports a strong family history of alcoholism. Says his grand father and father are all alcoholics. Says he has been drinking for decades. Daily pattern of drinking. Drinks at home. Says his drinking has been affecting his relationships over the years. It affected his marriage. Says it has been affecting his current relationship. Says he argues a lot with his current girlfriend. Says he became very emotional while intoxicated. Says he has never felt suicidal or threatened to hurt himself in the past. Says he was intoxicated when he felt that way. He is glad he is in a safe place. Says he wants to use this opportunity to work on his alcoholism. Says he just turned [age] and does not have any medical issues. Says his grandmother is over ninety and she is in good health. Knows that alcohol would make him age faster. I shared his liver enzymes with him and patient felt more motivated to deal with his addiction. Reports missing work some days as a result of effects of alcohol. Does not use any other street substance.  He has been in the hospital system for a couple of  days. He has been getting Ativan. Shakes are less. No  sweatiness, no headaches. No retching, nausea or vomiting. No fullness in the head. No visual, tactile or auditory hallucination. No internal restlessness. No current suicidal thoughts. No homicidal thoughts. No thoughts of violence. Says he has only one gun and the police have taken it. He has two DUI's and a pending court date for it. No other stressors.   Total Time spent with patient: 1 hour  Past Psychiatric History: Treated by Dr. Evelene Croon for depression. Has been on Lexapro since his divorce. No past inpatient care. No past suicidal behavior. No past history of violence.   Is the patient at risk to self? No.  Has the patient been a risk to self in the past 6 months? No.  Has the patient been a risk to self within the distant past? No.  Is the patient a risk to others? No.  Has the patient been a risk to others in the past 6 months? No.  Has the patient been a risk to others within the distant past? No.   Prior Inpatient Therapy:   Prior Outpatient Therapy:    Alcohol Screening: 1. How often do you have a drink containing alcohol?: 2 to 3 times a week 2. How many drinks containing alcohol do you have on a typical day when you are  drinking?: 5 or 6 3. How often do you have six or more drinks on one occasion?: Less than monthly Preliminary Score: 3 4. How often during the last year have you found that you were not able to stop drinking once you had started?: Weekly 5. How often during the last year have you failed to do what was normally expected from you becasue of drinking?: Less than monthly 6. How often during the last year have you needed a first drink in the morning to get yourself going after a heavy drinking session?: Less than monthly 7. How often during the last year have you had a feeling of guilt of remorse after drinking?: Daily or almost daily 8. How often during the last year have you been unable to remember what happened  the night before because you had been drinking?: Daily or almost daily 9. Have you or someone else been injured as a result of your drinking?: No 10. Has a relative or friend or a doctor or another health worker been concerned about your drinking or suggested you cut down?: Yes, during the last year Alcohol Use Disorder Identification Test Final Score (AUDIT): 23 Brief Intervention: Yes Substance Abuse History in the last 12 months:  Yes.   Consequences of Substance Abuse: As above  Previous Psychotropic Medications: Yes  Psychological Evaluations: Yes  Past Medical History: History reviewed. No pertinent past medical history. History reviewed. No pertinent surgical history. Family History: History reviewed. No pertinent family history. Family Psychiatric  History: Strong family history of addiction. No family history of any other major mental illness. No family history of suicide.  Tobacco Screening: Have you used any form of tobacco in the last 30 days? (Cigarettes, Smokeless Tobacco, Cigars, and/or Pipes): No Social History:  History  Alcohol Use  . Yes    Comment: vodka every other day     History  Drug Use No    Additional Social History:        Allergies:  No Known Allergies Lab Results:  Results for orders placed or performed during the hospital encounter of 09/22/16 (from the past 48 hour(s))  Ammonia     Status: Abnormal   Collection Time: 09/22/16  8:46 PM  Result Value Ref Range   Ammonia <9 (L) 9 - 35 umol/L    Blood Alcohol level:  Lab Results  Component Value Date   ETH 200 (H) 09/22/2016   ETH 336 (HH) 09/22/2016    Metabolic Disorder Labs:  No results found for: HGBA1C, MPG No results found for: PROLACTIN No results found for: CHOL, TRIG, HDL, CHOLHDL, VLDL, LDLCALC  Current Medications: No current facility-administered medications for this encounter.    PTA Medications: Prescriptions Prior to Admission  Medication Sig Dispense Refill Last Dose  .  escitalopram (LEXAPRO) 20 MG tablet Take 20 mg by mouth daily.   09/21/2016 at Unknown time  . meclizine (ANTIVERT) 25 MG tablet Take 1 tablet (25 mg total) by mouth 3 (three) times daily as needed for dizziness. (Patient not taking: Reported on 11/02/2015) 30 tablet 0 Not Taking at Unknown time    Musculoskeletal: Strength & Muscle Tone: within normal limits Gait & Station: normal Patient leans: N/A  Psychiatric Specialty Exam: Physical Exam  Constitutional: He is oriented to person, place, and time. He appears well-developed and well-nourished.  HENT:  Head: Normocephalic and atraumatic.  Respiratory: Effort normal.  Neurological: He is alert and oriented to person, place, and time.  Psychiatric:  As above  ROS  Blood pressure 121/83, pulse (!) 118, temperature 98.6 F (37 C), temperature source Oral, resp. rate 18, height 5' 9.75" (1.772 m), weight 65.1 kg (143 lb 8 oz).Body mass index is 20.74 kg/m.  General Appearance: In hospital clothing. Not shaky, not sweaty, not confused. Not unsteady, normal conjugate eye movements. Not internally distressed. Appropriate behavior. Appropriately tearful while talking about his desire to quit.   Eye Contact:  Good  Speech:  Clear and Coherent and Normal Rate  Volume:  Normal  Mood:  Feeling better.  Affect:  Appropriate  Thought Process:  Linear  Orientation:  Full (Time, Place, and Person)  Thought Content:  Future oriented. No hallucination in any modality. No thoughts of violence.   Suicidal Thoughts:  No  Homicidal Thoughts:  No  Memory:  Immediate;   Good Recent;   Fair Remote;   Good  Judgement:  Fair  Insight:  Good  Psychomotor Activity:  Normal  Concentration:  Concentration: Good and Attention Span: Good  Recall:  Good  Fund of Knowledge:  Good  Language:  Good  Akathisia:  Negative  Handed:    AIMS (if indicated):     Assets:  Communication Skills Desire for Improvement Housing Intimacy Leisure Time Physical  Health Resilience Vocational/Educational  ADL's:  Intact  Cognition:  WNL  Sleep:       Treatment Plan Summary: Patient is coming off the effects of alcohol. He is no longer having suicidal thoughts. He is future oriented. He is considering long term rehab. We discussed use of naltrexone to address cravings. Patient consented to treatment after we explored the risks and benefits. We plan to continue his home medications.   Psychiatric: AUD MDD  Medical:  Psychosocial:  Legal issues  PLAN: 1. Alcohol withdrawal protocol 2. Naltrexone 50 mg daily 3. Lexapro 10 mg daily 4. Encourage unit groups and activities 5. Monitor mood, behavior and interaction with peers 6. Motivational enhancement  7. SW would gather collateral and facilitate aftercare   Observation Level/Precautions:  Detox 15 minute checks  Laboratory:    Psychotherapy:    Medications:    Consultations:    Discharge Concerns:    Estimated LOS:  Other:     Physician Treatment Plan for Primary Diagnosis: <principal problem not specified> Long Term Goal(s): Improvement in symptoms so as ready for discharge  Short Term Goals: Ability to identify changes in lifestyle to reduce recurrence of condition will improve, Ability to verbalize feelings will improve, Ability to disclose and discuss suicidal ideas, Ability to demonstrate self-control will improve, Ability to identify and develop effective coping behaviors will improve, Ability to maintain clinical measurements within normal limits will improve, Compliance with prescribed medications will improve and Ability to identify triggers associated with substance abuse/mental health issues will improve  Physician Treatment Plan for Secondary Diagnosis: Active Problems:   * No active hospital problems. *  Long Term Goal(s): Improvement in symptoms so as ready for discharge  Short Term Goals: Ability to identify changes in lifestyle to reduce recurrence of condition will  improve, Ability to verbalize feelings will improve, Ability to disclose and discuss suicidal ideas, Ability to demonstrate self-control will improve, Ability to identify and develop effective coping behaviors will improve, Ability to maintain clinical measurements within normal limits will improve, Compliance with prescribed medications will improve and Ability to identify triggers associated with substance abuse/mental health issues will improve  I certify that inpatient services furnished can reasonably be expected to improve the patient's condition.  Georgiann Cocker, MD 9/16/201810:35 AM

## 2016-09-24 NOTE — [facility] Group Notes (Signed)
   [facility] LCSW Group Therapy Note  09/24/2016  10:15 to 11 AM   Type of Therapy and Topic: Group Therapy: Feelings Around Returning Home & Establishing a Supportive Framework   Participation Level:  Active   Description of Group:  Patients first processed thoughts and feelings about up coming discharge. These included fears of upcoming changes, lack of change, new living environments, judgements and expectations from others and overall stigma of MH issues. We then discussed what is a supportive framework? What does it look like feel like and how do I discern it from and unhealthy non-supportive network? Learn how to cope when supports are not helpful and don't support you. Discuss what to do when your family/friends are not supportive.   Therapeutic Goals Addressed in Processing Group:  1. Patient will identify one healthy supportive network that they can use at discharge. 2. Patient will identify one factor of a supportive framework and how to tell it from an unhealthy network. 3. Patient able to identify one coping skill to use when they do not have positive supports from others. 4. Patient will demonstrate ability to communicate their needs through discussion and/or role plays.  Summary of Patient Progress:  Pt participated easily during this his first group session yet remarks were somewhat tangential.  As patients processed their anxiety about discharge and described healthy supports patient left to meet with physician.    Carney Bern, LCSW

## 2016-09-24 NOTE — [facility] Group Notes (Signed)
[facility] Group Notes:  (Nursing/MHT/Case Management/Adjunct)  Date:  09/24/2016  Time:  1330  Type of Therapy:  Nurse Education - Meditation of New Beginnings  Participation Level:  Did Not Attend  Participation Quality:    Affect:    Cognitive:    Insight:    Engagement in Group:    Modes of Intervention:    Summary of Progress/Problems: Patient invited however elected not to attend.   Lawrence Marseilles 09/24/2016, 2:51 PM

## 2016-09-24 NOTE — Progress Notes (Signed)
Admit note: Patient admitted after receiving med clearance from [facility]. IVC by father who witnessed patient intoxicated and expressing SI to shoot himself.  When [employer] arrived patient reached for his shotgun but was stopped. Patient states he has never been suicidal and does not feel that way now but rather he was simply intoxicated. BAL was "336" on arrival to ED. Patient reports drinking a gallon of vodka "most days" of the week. Recent weight loss of 20 pounds due to "working all the time and my drinking." Med compliant with lexapro for depression. No PMH. Patient visibly tremulous and shaky all over on arrival. CIWA is an "11" and BP elevated (though improved from when ED). Patient anxious, tearful and states girlfriend is his main stressor. "She's just not supportive. She's the love of my life but she makes it all about her. We fought and I guess I just lost it. But I'm done with her." Report good family support but has significant family hx of sub abuse.   Patient's skin assessed, belongings searched and secured. Level III obs initiated and patient oriented to the unit. Obtained orders and ativan protocol started. Medicated per orders and pitcher of gatorade provided and encouraged. Emotional support and reassurance offered. Fall prevention education in place though patient is a low fall risk at this time.  Patient verbalizes understanding of POC, fall prevention plan. Denies SI/HI/AVH and remains safe at present. Will continue to monitor closely and make verbal contact frequently.

## 2016-09-24 NOTE — [facility] Suicide Risk Assessment (Signed)
[facility] Admission Suicide Risk Assessment   Nursing information obtained from:    Demographic factors:    Current Mental Status:    Loss Factors:    Historical Factors:    Risk Reduction Factors:     Total Time spent with patient: 30 minutes Principal Problem: Alcohol abuse with alcohol-induced mood disorder (HCC) Diagnosis:   Patient Active Problem List   Diagnosis Date Noted  . Alcohol abuse with alcohol-induced mood disorder [facility]) [F10.14] 09/22/2016   Subjective Data:  51 y.o Caucasian male, divorced, lives alone, self employed . Known history of AUD. Presented to the ER via the police. His father called and alerted the police after patient threatened to shoot himself. He had a rifle at home and attempted to use it when the police got to his place. Patient was intoxicated with alcohol. BAL was 336 mg/dl. UDS was negative. CMP normal electrolytes but mildly elevated of liver enzymes. No longer suicidal since he has been sober. No past suicidal behavior. Now future oriented. Wants to get into rehab. Wants to engage with AA. No longer has any weapons at home. No associated psychosis. No associated confusion. No associated thoughts of violence. He is cooperative with care and seems to be motivated to quit.  Continued Clinical Symptoms:  Alcohol Use Disorder Identification Test Final Score (AUDIT): 23 The "Alcohol Use Disorders Identification Test", Guidelines for Use in Primary Care, Second Edition.  World Science writer [facility]). Score between 0-7:  no or low risk or alcohol related problems. Score between 8-15:  moderate risk of alcohol related problems. Score between 16-19:  high risk of alcohol related problems. Score 20 or above:  warrants further diagnostic evaluation for alcohol dependence and treatment.   CLINICAL FACTORS:   Alcohol/Substance Abuse/Dependencies   Musculoskeletal: Strength & Muscle Tone: within normal limits Gait & Station: normal Patient leans:  N/A  Psychiatric Specialty Exam: Physical Exam  ROS  Blood pressure 121/83, pulse (!) 118, temperature 98.6 F (37 C), temperature source Oral, resp. rate 18, height 5' 9.75" (1.772 m), weight 65.1 kg (143 lb 8 oz).Body mass index is 20.74 kg/m.  General Appearance: As in H&P  Eye Contact:  As in H&P  Speech:  As in H&P  Volume:  As in H&P  Mood:  As in H&P  Affect:  As in H&P  Thought Process:  As in H&P  Orientation:  As in H&P  Thought Content:  As in H&P  Suicidal Thoughts:  As in H&P  Homicidal Thoughts:  As in H&P  Memory:  As in H&P  Judgement:  As in H&P  Insight: As in H&P  Psychomotor Activity:  As in H&P  Concentration:  As in H&P  Recall:  As in H&P  Fund of Knowledge:  As in H&P  Language:  As in H&P  Akathisia:  As in H&P  Handed:  As in H&P  AIMS (if indicated):     Assets:  As in H&P  ADL's:  As in H&P  Cognition:  As in H&P  Sleep:         COGNITIVE FEATURES THAT CONTRIBUTE TO RISK:  None    SUICIDE RISK:   Minimal: No identifiable suicidal ideation.  Patients presenting with no risk factors but with morbid ruminations; may be classified as minimal risk based on the severity of the depressive symptoms  PLAN OF CARE: As in H&P  I certify that inpatient services furnished can reasonably be expected to improve the patient's condition.  Georgiann Cocker, MD 09/24/2016, 11:22 AM

## 2016-09-24 NOTE — ED Notes (Signed)
Pt discharged safely with [employer].  Pt was calm and cooperative.  Showed no symptoms of withdrawal.  All belongings were sent with patient.

## 2016-09-25 NOTE — Progress Notes (Signed)
[facility] MD Progress Note  09/25/2016 11:46 AM Alex Logan  MRN:  [id number] Subjective:   51 y.o Caucasian male, divorced, lives alone, self employed . Known history of AUD. Presented to the ER via the police. His father called and alerted the police after patient threatened to shoot himself. He had a rifle at home and attempted to use it when the police got to his place. Patient was intoxicated with alcohol. BAL was 336 mg/dl. UDS was negative. CMP normal electrolytes but mildly elevated of liver enzymes.  Chart reviewed today. Patient discussed at team today.  Staff reports that he is still in withdrawals. Pulse and BP are still unstable. He appears nervous. Limited group engagement. Has not reported any abnormal perception. Has not been observed to be responding to internal stimuli.   Seen today, says he is feeling calmer today. He just played basket ball. Anxiety and tremors are much less. No sweatiness, no headaches. No retching, nausea or vomiting. No fullness in the head. No visual, tactile or auditory hallucination. No internal restlessness. No suicidal or homicidal thoughts. Remains focused on long term rehab.  Principal Problem: Alcohol abuse with alcohol-induced mood disorder (HCC) Diagnosis:   Patient Active Problem List   Diagnosis Date Noted  . Alcohol abuse with alcohol-induced mood disorder (HCC) [F10.14] 09/22/2016   Total Time spent with patient: 20 minutes  Past Psychiatric History: As in H&P Past Medical History: History reviewed. No pertinent past medical history. History reviewed. No pertinent surgical history. Family History: History reviewed. No pertinent family history. Family Psychiatric  History: As in H&P Social History:  History  Alcohol Use  . Yes    Comment: vodka every other day     History  Drug Use No    Social History   Social History  . Marital status: Divorced    Spouse name: N/A  . Number of children: N/A  . Years of education: N/A    Social History Main Topics  . Smoking status: Never Smoker  . Smokeless tobacco: Never Used  . Alcohol use Yes     Comment: vodka every other day  . Drug use: No  . Sexual activity: Not Asked   Other Topics Concern  . None   Social History Narrative  . None   Additional Social History:      Sleep: Good  Appetite:  Good  Current Medications: Current Facility-Administered Medications  Medication Dose Route Frequency Provider Last Rate Last Dose  . alum & mag hydroxide-simeth (MAALOX/MYLANTA) 200-200-20 MG/5ML suspension 30 mL  30 mL Oral Q4H PRN Izediuno, Delight Ovens, MD   30 mL at 09/24/16 1622  . escitalopram (LEXAPRO) tablet 10 mg  10 mg Oral Daily Izediuno, Delight Ovens, MD   10 mg at 09/25/16 0751  . feeding supplement (ENSURE ENLIVE) (ENSURE ENLIVE) liquid 237 mL  237 mL Oral BID BM Izediuno, Vincent A, MD   237 mL at 09/24/16 1440  . hydrOXYzine (ATARAX/VISTARIL) tablet 25 mg  25 mg Oral Q6H PRN Georgiann Cocker, MD   25 mg at 09/24/16 2323  . ibuprofen (ADVIL,MOTRIN) tablet 600 mg  600 mg Oral Q6H PRN Jackelyn Poling, NP      . Influenza vac split quadrivalent PF (FLUARIX) injection 0.5 mL  0.5 mL Intramuscular Tomorrow-1000 Izediuno, Vincent A, MD      . loperamide (IMODIUM) capsule 2-4 mg  2-4 mg Oral PRN Izediuno, Delight Ovens, MD      . LORazepam (ATIVAN) tablet 1 mg  1 mg Oral Q6H PRN Georgiann Cocker, MD   1 mg at 09/24/16 1944  . LORazepam (ATIVAN) tablet 1 mg  1 mg Oral TID Georgiann Cocker, MD   1 mg at 09/25/16 0752   Followed by  . [START ON 09/26/2016] LORazepam (ATIVAN) tablet 1 mg  1 mg Oral BID Izediuno, Delight Ovens, MD       Followed by  . [START ON 09/27/2016] LORazepam (ATIVAN) tablet 1 mg  1 mg Oral Daily Izediuno, Vincent A, MD      . magnesium hydroxide (MILK OF MAGNESIA) suspension 30 mL  30 mL Oral Daily PRN Izediuno, Delight Ovens, MD      . multivitamin with minerals tablet 1 tablet  1 tablet Oral Daily Izediuno, Delight Ovens, MD   1 tablet at 09/25/16  0751  . naltrexone (DEPADE) tablet 50 mg  50 mg Oral Daily Izediuno, Delight Ovens, MD   50 mg at 09/25/16 0751  . omega-3 acid ethyl esters (LOVAZA) capsule 1 g  1 g Oral BID Izediuno, Delight Ovens, MD   1 g at 09/25/16 0750  . ondansetron (ZOFRAN-ODT) disintegrating tablet 4 mg  4 mg Oral Q6H PRN Georgiann Cocker, MD   4 mg at 09/24/16 1945  . pneumococcal 23 valent vaccine (PNU-IMMUNE) injection 0.5 mL  0.5 mL Intramuscular Tomorrow-1000 Izediuno, Vincent A, MD      . thiamine (B-1) injection 100 mg  100 mg Intramuscular Once Izediuno, Vincent A, MD      . thiamine (VITAMIN B-1) tablet 100 mg  100 mg Oral Daily Izediuno, Delight Ovens, MD   100 mg at 09/25/16 0751  . traZODone (DESYREL) tablet 50 mg  50 mg Oral QHS,MR X 1 Nira Conn A, NP        Lab Results: No results found for this or any previous visit (from the past 48 hour(s)).  Blood Alcohol level:  Lab Results  Component Value Date   ETH 200 (H) 09/22/2016   ETH 336 (HH) 09/22/2016    Metabolic Disorder Labs: No results found for: HGBA1C, MPG No results found for: PROLACTIN No results found for: CHOL, TRIG, HDL, CHOLHDL, VLDL, LDLCALC  Physical Findings: AIMS: Facial and Oral Movements Muscles of Facial Expression: None, normal Lips and Perioral Area: None, normal Jaw: None, normal Tongue: None, normal,Extremity Movements Upper (arms, wrists, hands, fingers): None, normal Lower (legs, knees, ankles, toes): None, normal, Trunk Movements Neck, shoulders, hips: None, normal, Overall Severity Severity of abnormal movements (highest score from questions above): None, normal Incapacitation due to abnormal movements: None, normal Patient's awareness of abnormal movements (rate only patient's report): No Awareness, Dental Status Current problems with teeth and/or dentures?: No Does patient usually wear dentures?: No  CIWA:  CIWA-Ar Total: 5 COWS:     Musculoskeletal: Strength & Muscle Tone: within normal limits Gait & Station:  normal Patient leans: N/A  Psychiatric Specialty Exam: Physical Exam  Constitutional: He is oriented to person, place, and time. He appears well-developed and well-nourished.  HENT:  Head: Normocephalic and atraumatic.  Respiratory: Effort normal.  Neurological: He is alert and oriented to person, place, and time.  Psychiatric:  As above     ROS  Blood pressure (!) 120/93, pulse (!) 105, temperature 98.6 F (37 C), temperature source Oral, resp. rate 18, height 5' 9.75" (1.772 m), weight 65.1 kg (143 lb 8 oz).Body mass index is 20.74 kg/m.  General Appearance: Not shaky, not sweaty, not confused. Not unsteady, normal conjugate eye movements.  Not internally distressed. Appropriate behavior.   Eye Contact:  Good  Speech:  Clear and Coherent and Normal Rate  Volume:  Normal  Mood:  Euthymic  Affect:  Appropriate and Full Range  Thought Process:  Goal Directed and Linear  Orientation:  Full (Time, Place, and Person)  Thought Content:  No delusional theme. No preoccupation with violent thoughts. No negative ruminations. No obsession.  No hallucination in any modality.   Suicidal Thoughts:  No  Homicidal Thoughts:  No  Memory:  Immediate;   Good Recent;   Good Remote;   Good  Judgement:  Good  Insight:  Good  Psychomotor Activity:  Normal  Concentration:  Concentration: Good and Attention Span: Good  Recall:  Good  Fund of Knowledge:  Good  Language:  Good  Akathisia:  Negative  Handed:    AIMS (if indicated):     Assets:  Communication Skills Desire for Improvement Physical Health Resilience Transportation Vocational/Educational  ADL's:  Intact  Cognition:  WNL  Sleep:  Number of Hours: 6     Treatment Plan Summary: Patient is detoxiong well. No complications so far. No dangerousness.  He is motivated to stay sober. He plans to explore locally available rehabs with his SW today. Hopeful discharge at midweek.   Psychiatric: AUD MDD  Medical:  Psychosocial:   Legal issues  PLAN: 1. Continue medications at current dose.  2. Continue to monitor mood behavior and interaction with peers 3.  Encourage unit groups and activities  Georgiann Cocker, MD 09/25/2016, 11:46 AM

## 2016-09-25 NOTE — [facility] Group Notes (Signed)
LCSW Group Therapy Note   09/25/2016 1:15pm   Type of Therapy and Topic:  Group Therapy:  Overcoming Obstacles   Participation Level: Active   Description of Group:    In this group patients will be encouraged to explore what they see as obstacles to their own wellness and recovery. They will be guided to discuss their thoughts, feelings, and behaviors related to these obstacles. The group will process together ways to cope with barriers, with attention given to specific choices patients can make. Each patient will be challenged to identify changes they are motivated to make in order to overcome their obstacles. This group will be process-oriented, with patients participating in exploration of their own experiences as well as giving and receiving support and challenge from other group members.   Therapeutic Goals: 1. Patient will identify personal and current obstacles as they relate to admission. 2. Patient will identify barriers that currently interfere with their wellness or overcoming obstacles.  3. Patient will identify feelings, thought process and behaviors related to these barriers. 4. Patient will identify two changes they are willing to make to overcome these obstacles:     Therapeutic Modalities:   Cognitive Behavioral Therapy Solution Focused Therapy Motivational Interviewing Relapse Prevention Therapy  Jonathon Jordan, MSW, LCSWA 09/25/2016 5:11 PM

## 2016-09-25 NOTE — Progress Notes (Signed)
D   Pt is pleasant on approach and cooperative   He interacts well with others and has been compliant with treatment   He complains frequently about the service he has received here at [facility] but he also said there were some good things about his stay here  A   Verbal support given   Medications administered and effectiveness monitored   q 15 min checks  R    Pt is safe at present time and receptive to verbal support

## 2016-09-25 NOTE — Progress Notes (Signed)
Recreation Therapy Notes  Date: 09/25/16 Time: 0930 Location: 300 Dayroom  Group Topic: Stress Management  Goal Area(s) Addresses:  Patient will verbalize importance of using healthy stress management.  Patient will identify positive emotions associated with healthy stress management.   Behavioral Response: Engaged  Intervention: Stress Management  Activity :  Meditation.  LRT introduced the stress management technique of meditation.  Patients were to listen and follow along as a meditation played from the Calm app.    Education:  Stress Management, Discharge Planning.   Education Outcome: Acknowledges edcuation/In group clarification offered/Needs additional education  Clinical Observations/Feedback: Pt attended group.   Caroll Rancher, LRT/CTRS        Caroll Rancher A 09/25/2016 11:53 AM

## 2016-09-25 NOTE — Tx Team (Signed)
Interdisciplinary Treatment and Diagnostic Plan Update 09/25/2016 Time of Session: 9:30am  Alex Logan  MRN: [id number]  Principal Diagnosis: Alcohol abuse with alcohol-induced mood disorder ([place])  Secondary Diagnoses: Principal Problem:   Alcohol abuse with alcohol-induced mood disorder ([place])   Current Medications:  Current Facility-Administered Medications  Medication Dose Route Frequency Provider Last Rate Last Dose  . alum & mag hydroxide-simeth (MAALOX/MYLANTA) 200-200-20 MG/5ML suspension 30 mL  30 mL Oral Q4H PRN Izediuno, Laruth Bouchard, MD   30 mL at 09/24/16 1622  . escitalopram (LEXAPRO) tablet 10 mg  10 mg Oral Daily Izediuno, Laruth Bouchard, MD   10 mg at 09/25/16 0751  . feeding supplement (ENSURE ENLIVE) (ENSURE ENLIVE) liquid 237 mL  237 mL Oral BID BM Izediuno, Laruth Bouchard, MD   237 mL at 09/25/16 1209  . hydrOXYzine (ATARAX/VISTARIL) tablet 25 mg  25 mg Oral Q6H PRN Artist Beach, MD   25 mg at 09/24/16 2323  . ibuprofen (ADVIL,MOTRIN) tablet 600 mg  600 mg Oral Q6H PRN Rozetta Nunnery, NP      . Influenza vac split quadrivalent PF (FLUARIX) injection 0.5 mL  0.5 mL Intramuscular Tomorrow-1000 Izediuno, Vincent A, MD      . loperamide (IMODIUM) capsule 2-4 mg  2-4 mg Oral PRN Izediuno, Laruth Bouchard, MD      . LORazepam (ATIVAN) tablet 1 mg  1 mg Oral Q6H PRN Artist Beach, MD   1 mg at 09/24/16 1944  . LORazepam (ATIVAN) tablet 1 mg  1 mg Oral TID Artist Beach, MD   1 mg at 09/25/16 1209   Followed by  . [START ON 09/26/2016] LORazepam (ATIVAN) tablet 1 mg  1 mg Oral BID Izediuno, Laruth Bouchard, MD       Followed by  . [START ON 09/27/2016] LORazepam (ATIVAN) tablet 1 mg  1 mg Oral Daily Izediuno, Vincent A, MD      . magnesium hydroxide (MILK OF MAGNESIA) suspension 30 mL  30 mL Oral Daily PRN Izediuno, Laruth Bouchard, MD      . multivitamin with minerals tablet 1 tablet  1 tablet Oral Daily Izediuno, Laruth Bouchard, MD   1 tablet at 09/25/16 0751  . naltrexone (DEPADE)  tablet 50 mg  50 mg Oral Daily Izediuno, Laruth Bouchard, MD   50 mg at 09/25/16 0751  . omega-3 acid ethyl esters (LOVAZA) capsule 1 g  1 g Oral BID Izediuno, Laruth Bouchard, MD   1 g at 09/25/16 0750  . ondansetron (ZOFRAN-ODT) disintegrating tablet 4 mg  4 mg Oral Q6H PRN Artist Beach, MD   4 mg at 09/24/16 1945  . pneumococcal 23 valent vaccine (PNU-IMMUNE) injection 0.5 mL  0.5 mL Intramuscular Tomorrow-1000 Izediuno, Vincent A, MD      . thiamine (B-1) injection 100 mg  100 mg Intramuscular Once Izediuno, Vincent A, MD      . thiamine (VITAMIN B-1) tablet 100 mg  100 mg Oral Daily Izediuno, Laruth Bouchard, MD   100 mg at 09/25/16 0751  . traZODone (DESYREL) tablet 50 mg  50 mg Oral QHS,MR X 1 Lindon Romp A, NP        PTA Medications: Prescriptions Prior to Admission  Medication Sig Dispense Refill Last Dose  . escitalopram (LEXAPRO) 20 MG tablet Take 20 mg by mouth daily.   09/21/2016 at Unknown time  . meclizine (ANTIVERT) 25 MG tablet Take 1 tablet (25 mg total) by mouth 3 (three) times daily as needed for dizziness. (  Patient not taking: Reported on 11/02/2015) 30 tablet 0 Not Taking at Unknown time    Treatment Modalities: Medication Management, Group therapy, Case management,  1 to 1 session with clinician, Psychoeducation, Recreational therapy.  Patient Stressors: Loss of relationship (break up with gf) Substance abuse Patient Strengths: Ability for insight Capable of independent living Occupational psychologist fund of knowledge Motivation for treatment/growth Physical Health Supportive family/friends Work Financial controller Plan for Primary Diagnosis: Alcohol abuse with alcohol-induced mood disorder ([place]) Long Term Goal(s): Improvement in symptoms so as ready for discharge Short Term Goals: Ability to identify changes in lifestyle to reduce recurrence of condition will improve Ability to verbalize feelings will improve Ability to disclose and discuss  suicidal ideas Ability to demonstrate self-control will improve Ability to identify and develop effective coping behaviors will improve Ability to maintain clinical measurements within normal limits will improve Compliance with prescribed medications will improve Ability to identify triggers associated with substance abuse/mental health issues will improve Ability to identify changes in lifestyle to reduce recurrence of condition will improve Ability to verbalize feelings will improve Ability to disclose and discuss suicidal ideas Ability to demonstrate self-control will improve Ability to identify and develop effective coping behaviors will improve Ability to maintain clinical measurements within normal limits will improve Compliance with prescribed medications will improve Ability to identify triggers associated with substance abuse/mental health issues will improve  Medication Management: Evaluate patient's response, side effects, and tolerance of medication regimen.  Therapeutic Interventions: 1 to 1 sessions, Unit Group sessions and Medication administration.  Evaluation of Outcomes: Not Met  Physician Treatment Plan for Secondary Diagnosis: Principal Problem:   Alcohol abuse with alcohol-induced mood disorder ([place])  Long Term Goal(s): Improvement in symptoms so as ready for discharge  Short Term Goals: Ability to identify changes in lifestyle to reduce recurrence of condition will improve Ability to verbalize feelings will improve Ability to disclose and discuss suicidal ideas Ability to demonstrate self-control will improve Ability to identify and develop effective coping behaviors will improve Ability to maintain clinical measurements within normal limits will improve Compliance with prescribed medications will improve Ability to identify triggers associated with substance abuse/mental health issues will improve Ability to identify changes in lifestyle to reduce recurrence  of condition will improve Ability to verbalize feelings will improve Ability to disclose and discuss suicidal ideas Ability to demonstrate self-control will improve Ability to identify and develop effective coping behaviors will improve Ability to maintain clinical measurements within normal limits will improve Compliance with prescribed medications will improve Ability to identify triggers associated with substance abuse/mental health issues will improve  Medication Management: Evaluate patient's response, side effects, and tolerance of medication regimen.  Therapeutic Interventions: 1 to 1 sessions, Unit Group sessions and Medication administration.  Evaluation of Outcomes: Not Met  RN Treatment Plan for Primary Diagnosis: Alcohol abuse with alcohol-induced mood disorder ([place]) Long Term Goal(s): Knowledge of disease and therapeutic regimen to maintain health will improve  Short Term Goals: Ability to demonstrate self-control, Ability to identify and develop effective coping behaviors will improve and Compliance with prescribed medications will improve  Medication Management: RN will administer medications as ordered by provider, will assess and evaluate patient's response and provide education to patient for prescribed medication. RN will report any adverse and/or side effects to prescribing provider.  Therapeutic Interventions: 1 on 1 counseling sessions, Psychoeducation, Medication administration, Evaluate responses to treatment, Monitor vital signs and CBGs as ordered, Perform/monitor CIWA, COWS, AIMS and Fall Risk screenings  as ordered, Perform wound care treatments as ordered.  Evaluation of Outcomes: Not Met  LCSW Treatment Plan for Primary Diagnosis: Alcohol abuse with alcohol-induced mood disorder ([place]) Long Term Goal(s): Safe transition to appropriate next level of care at discharge, Engage patient in therapeutic group addressing interpersonal concerns. Short Term Goals: Engage  patient in aftercare planning with referrals and resources, Facilitate patient progression through stages of change regarding substance use diagnoses and concerns, Identify triggers associated with mental health/substance abuse issues and Increase skills for wellness and recovery  Therapeutic Interventions: Assess for all discharge needs, 1 to 1 time with Social worker, Explore available resources and support systems, Assess for adequacy in community support network, Educate family and significant other(s) on suicide prevention, Complete Psychosocial Assessment, Interpersonal group therapy.  Evaluation of Outcomes: Not Met  Progress in Treatment: Attending groups: Pt is new to milieu, continuing to assess  Participating in groups: Pt is new to milieu, continuing to assess  Taking medication as prescribed: Yes, MD continues to assess for medication changes as needed Toleration medication: Yes, no side effects reported at this time Family/Significant other contact made: No, CSW assessing for appropriate contact Patient understands diagnosis: Continuing to assess Discussing patient identified problems/goals with staff: Yes Medical problems stabilized or resolved: Yes Denies suicidal/homicidal ideation: Yes Issues/concerns per patient self-inventory: None Other: N/A  New problem(s) identified: None identified at this time.   New Short Term/Long Term Goal(s): None identified at this time.   Discharge Plan or Barriers: CSW still assessing for appropriate discharge plan.  Reason for Continuation of Hospitalization:  Anxiety  Depression Medication stabilization Suicidal ideation Withdrawal symptoms  Estimated Length of Stay: 1-4 days; Estimated discharge date 09/29/16  Attendees: Patient: 09/25/2016 12:09 PM  Physician: Dr. Parke Poisson 09/25/2016 12:09 PM  Nursing: Sharl Ma RN; Linna Hoff, RN 09/25/2016 12:09 PM  RN Care Manager: Lars Pinks, RN 09/25/2016 12:09 PM  Social Worker: Matthew Saras, [place]  09/25/2016 12:09 PM  Recreational Therapist:  09/25/2016 12:09 PM  Other: Lindell Spar, NP 09/25/2016 12:09 PM  Other:  09/25/2016 12:09 PM  Other: 09/25/2016 12:09 PM  Scribe for Treatment Team: Georga Kaufmann, MSW,LCSWA 09/25/2016 12:09 PM

## 2016-09-25 NOTE — Progress Notes (Signed)
Nutrition Brief Note  Patient identified on the Malnutrition Screening Tool (MST) Report  Patient with history of ETOH abuse. Pt reports drinking a gallon of vodka daily.  Pt has been ordered Ensure supplements.  Wt Readings from Last 15 Encounters:  09/24/16 143 lb 8 oz (65.1 kg)  12/06/12 165 lb (74.8 kg)    Body mass index is 20.74 kg/m. Patient meets criteria for normal based on current BMI.  Labs and medications reviewed.   No nutrition interventions warranted at this time. If nutrition issues arise, please consult RD.   Tilda Franco, MS, RD, LDN Pager: [phone] After Hours Pager: [phone]
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
## 2016-09-25 NOTE — B[facility] Group Notes (Signed)
Pt refused 5 o'clock vitals because he was upset he could not see the doctor again.

## 2016-09-25 NOTE — Progress Notes (Signed)
DAR NOTE: Patient presents with anxious affect and mood.  Preoccupied with getting discharge.  Denies pain, auditory and visual hallucinations.  Rates depression at 0, hopelessness at 0, and anxiety at 0.  Maintained on routine safety checks.  Medications given as prescribed.  Support and encouragement offered as needed.  Attended group and participated.  States goal for today is "attitude."  Patient observed socializing with peers in the dayroom.  Offered no complaint.

## 2016-09-26 NOTE — BHH Group Notes (Signed)
Adult Psychoeducational Group Note  Date:  09/26/2016 Time:  9:22 PM  Group Topic/Focus:  Goals Group:   The focus of this group is to help patients establish daily goals to achieve during treatment and discuss how the patient can incorporate goal setting into their daily lives to aide in recovery.  Participation Level:  Active  Participation Quality:  Appropriate  Affect:  Appropriate  Cognitive:  Alert  Insight: Appropriate  Engagement in Group:  Engaged  Modes of Intervention:  Discussion  Additional Comments:  Pt was alert and appropriate in group. Pt goal for today was to eat and try to gain some weight due to recent weight loss. Pt plans to get released soon.  Dellia Nims 09/26/2016, 9:22 PM

## 2016-09-26 NOTE — Progress Notes (Signed)
D   Pt is pleasant on approach and cooperative   He interacts well with others and has been compliant with treatment   He continues to complain of the service here   He attends groups and participates appropriately  A   Verbal support given   Medications administered and effectiveness monitored   q 15 min checks  R    Pt is safe at present time and receptive to verbal support

## 2016-09-26 NOTE — Progress Notes (Signed)
Recreation Therapy Notes  Animal-Assisted Activity (AAA) Program Checklist/Progress Notes Patient Eligibility Criteria Checklist & Daily Group note for Rec TxIntervention  Date: 09.18.2018 Time: 2:45pm Location: [address]   AAA/T Program Assumption of Risk Form signed by Patient/ or Parent Legal Guardian Yes  Patient is free of allergies or sever asthma Yes  Patient reports no fear of animals Yes  Patient reports no history of cruelty to animals Yes  Patient understands his/her participation is voluntary Yes  Patient washes hands before animal contact Yes  Patient washes hands after animal contact Yes  Behavioral Response: Appropriate, Engaged   Education:Hand Washing, Appropriate Animal Interaction   Education Outcome: Acknowledges education.   Clinical Observations/Feedback: Patient attended session and interacted appropriately with therapy dog and peers.   Alex Logan Alex Logan, LRT/CTRS        Jearl Klinefelter 09/26/2016 2:58 PM

## 2016-09-26 NOTE — Progress Notes (Signed)
[facility] MD Progress Note  09/26/2016 3:22 PM Alex Logan  MRN:  [id number]  Subjective: Alex Logan reports, "I'm doing wonderful today. I feel good. I do not have any more withdrawal symptoms. I'm not feeling depressed. I'm ready to check out of here to start substance abuse treatment on an out patient basis two times a week. I have a lot of support system".  Objective: 51 y.o Caucasian male, divorced, lives alone, self employed . Known history of AUD. Presented to the ER via the police. His father called and alerted the police after patient threatened to shoot himself. He had a rifle at home and attempted to use it when the police got to his place. Patient was intoxicated with alcohol. BAL was 336 mg/dl. UDS was negative. CMP normal electrolytes but mildly elevated of liver enzymes.  Chart reviewed today. Patient discussed at team today.  Patient reports that he is no longer in withdrawals. Pulse and BP are still elevated. He appears calm. Group engagement improved. Has not reported any abnormal perception. Has not been observed to be responding to internal stimuli.   Seen today, says he is feeling well today. He just played basket ball. Anxiety and tremors are longer. No sweatiness, no headaches. No retching, nausea or vomiting. No fullness in the head. No visual, tactile or auditory hallucination. No internal restlessness. No suicidal or homicidal thoughts. Remains focused on long term rehab. Wants to be discharged in the morning.  Principal Problem: Alcohol abuse with alcohol-induced mood disorder (HCC) Diagnosis:   Patient Active Problem List   Diagnosis Date Noted  . Alcohol abuse with alcohol-induced mood disorder (HCC) [F10.14] 09/22/2016   Total Time spent with patient: 15 minutes  Past Psychiatric History: As in H&P  Past Medical History: History reviewed. No pertinent past medical history. History reviewed. No pertinent surgical history.  Family History: History reviewed. No  pertinent family history.  Family Psychiatric  History: As in H&P  Social History:  History  Alcohol Use  . Yes    Comment: vodka every other day     History  Drug Use No    Social History   Social History  . Marital status: Divorced    Spouse name: N/A  . Number of children: N/A  . Years of education: N/A   Social History Main Topics  . Smoking status: Never Smoker  . Smokeless tobacco: Never Used  . Alcohol use Yes     Comment: vodka every other day  . Drug use: No  . Sexual activity: Not Asked   Other Topics Concern  . None   Social History Narrative  . None   Additional Social History:      Sleep: Good  Appetite:  Good  Current Medications: Current Facility-Administered Medications  Medication Dose Route Frequency Provider Last Rate Last Dose  . alum & mag hydroxide-simeth (MAALOX/MYLANTA) 200-200-20 MG/5ML suspension 30 mL  30 mL Oral Q4H PRN Izediuno, Delight Ovens, MD   30 mL at 09/24/16 1622  . escitalopram (LEXAPRO) tablet 10 mg  10 mg Oral Daily Izediuno, Delight Ovens, MD   10 mg at 09/26/16 0800  . feeding supplement (ENSURE ENLIVE) (ENSURE ENLIVE) liquid 237 mL  237 mL Oral BID BM Izediuno, Vincent A, MD   237 mL at 09/26/16 1521  . hydrOXYzine (ATARAX/VISTARIL) tablet 25 mg  25 mg Oral Q6H PRN Georgiann Cocker, MD   25 mg at 09/25/16 2136  . ibuprofen (ADVIL,MOTRIN) tablet 600 mg  600 mg Oral Q6H  PRN Nira Conn A, NP      . loperamide (IMODIUM) capsule 2-4 mg  2-4 mg Oral PRN Izediuno, Delight Ovens, MD      . LORazepam (ATIVAN) tablet 1 mg  1 mg Oral Q6H PRN Georgiann Cocker, MD   1 mg at 09/25/16 2136  . LORazepam (ATIVAN) tablet 1 mg  1 mg Oral BID Izediuno, Delight Ovens, MD   1 mg at 09/26/16 0800   Followed by  . [START ON 09/27/2016] LORazepam (ATIVAN) tablet 1 mg  1 mg Oral Daily Izediuno, Vincent A, MD      . magnesium hydroxide (MILK OF MAGNESIA) suspension 30 mL  30 mL Oral Daily PRN Izediuno, Delight Ovens, MD      . multivitamin with minerals  tablet 1 tablet  1 tablet Oral Daily Izediuno, Delight Ovens, MD   1 tablet at 09/26/16 0800  . naltrexone (DEPADE) tablet 50 mg  50 mg Oral Daily Izediuno, Vincent A, MD   50 mg at 09/26/16 0800  . omega-3 acid ethyl esters (LOVAZA) capsule 1 g  1 g Oral BID Izediuno, Delight Ovens, MD   1 g at 09/26/16 0800  . ondansetron (ZOFRAN-ODT) disintegrating tablet 4 mg  4 mg Oral Q6H PRN Georgiann Cocker, MD   4 mg at 09/24/16 1945  . thiamine (B-1) injection 100 mg  100 mg Intramuscular Once Izediuno, Vincent A, MD      . thiamine (VITAMIN B-1) tablet 100 mg  100 mg Oral Daily Izediuno, Vincent A, MD   100 mg at 09/26/16 0800  . traZODone (DESYREL) tablet 50 mg  50 mg Oral QHS,MR X 1 Nira Conn A, NP   50 mg at 09/25/16 2136   Lab Results: No results found for this or any previous visit (from the past 48 hour(s)).  Blood Alcohol level:  Lab Results  Component Value Date   ETH 200 (H) 09/22/2016   ETH 336 (HH) 09/22/2016   Metabolic Disorder Labs: No results found for: HGBA1C, MPG No results found for: PROLACTIN No results found for: CHOL, TRIG, HDL, CHOLHDL, VLDL, LDLCALC  Physical Findings: AIMS: Facial and Oral Movements Muscles of Facial Expression: None, normal Lips and Perioral Area: None, normal Jaw: None, normal Tongue: None, normal,Extremity Movements Upper (arms, wrists, hands, fingers): None, normal Lower (legs, knees, ankles, toes): None, normal, Trunk Movements Neck, shoulders, hips: None, normal, Overall Severity Severity of abnormal movements (highest score from questions above): None, normal Incapacitation due to abnormal movements: None, normal Patient's awareness of abnormal movements (rate only patient's report): No Awareness, Dental Status Current problems with teeth and/or dentures?: No Does patient usually wear dentures?: No  CIWA:  CIWA-Ar Total: 1 COWS:     Musculoskeletal: Strength & Muscle Tone: within normal limits Gait & Station: normal Patient leans:  N/A  Psychiatric Specialty Exam: Physical Exam  Constitutional: He is oriented to person, place, and time. He appears well-developed and well-nourished.  HENT:  Head: Normocephalic and atraumatic.  Respiratory: Effort normal.  Neurological: He is alert and oriented to person, place, and time.  Psychiatric:  As above     ROS  Blood pressure (!) 135/95, pulse (!) 102, temperature 98.1 F (36.7 C), temperature source Oral, resp. rate 16, height 5' 9.75" (1.772 m), weight 65.1 kg (143 lb 8 oz).Body mass index is 20.74 kg/m.  General Appearance: Not shaky, not sweaty, not confused. Not unsteady, normal conjugate eye movements. Not internally distressed. Appropriate behavior.   Eye Contact:  Good  Speech:  Clear and Coherent and Normal Rate  Volume:  Normal  Mood:  Euthymic  Affect:  Appropriate and Full Range  Thought Process:  Goal Directed and Linear  Orientation:  Full (Time, Place, and Person)  Thought Content:  No delusional theme. No preoccupation with violent thoughts. No negative ruminations. No obsession.  No hallucination in any modality.   Suicidal Thoughts:  No  Homicidal Thoughts:  No  Memory:  Immediate;   Good Recent;   Good Remote;   Good  Judgement:  Good  Insight:  Good  Psychomotor Activity:  Normal  Concentration:  Concentration: Good and Attention Span: Good  Recall:  Good  Fund of Knowledge:  Good  Language:  Good  Akathisia:  Negative  Handed:    AIMS (if indicated):     Assets:  Communication Skills Desire for Improvement Physical Health Resilience Transportation Vocational/Educational  ADL's:  Intact  Cognition:  WNL  Sleep:  Number of Hours: 6   Treatment Plan Summary: Patient is detoxiong well. No complications so far. No dangerousness.  He is motivated to stay sober. He plans to explore locally available rehabs with his SW today. Hopeful to discharge tomorrow 09-27-16 .   Psychiatric: AUD MDD  Medical:  Psychosocial:  Legal  issues  PLAN: 1. Continue medications at current dose.  -Lexapro 10 mg daily for depression. -Hydroxyzine 25 mg prn Q 6 hours for anxiety. -Will discontinue the Ativan protocol, denies any withdrawal symptoms. -Naltrexone 50 mg daily for alcohol cravings. -Trazodone 50 mg Q hs prn, may repeat. 2. Continue to monitor mood behavior and interaction with peers 3.  Encourage unit groups and activities. Possible discharge in the morning if remains stable.  Sanjuana Kava, NP, PMHNP, FNP-BC. 09/26/2016, 3:22 PMPatient ID: Alex Logan, male   DOB: Mar 17, 1965, 51 y.o.   MRN: [id number]

## 2016-09-26 NOTE — BHH Group Notes (Signed)
[facility] Mental Health Association Group Therapy 09/26/2016 1:15pm  Type of Therapy: Mental Health Association Presentation  Participation Level: Active  Participation Quality: Attentive  Affect: Appropriate  Cognitive: Oriented  Insight: Developing/Improving  Engagement in Therapy: Engaged  Modes of Intervention: Discussion, Education and Socialization  Summary of Progress/Problems: Mental Health Association (MHA) Speaker came to talk about his personal journey with mental health. The pt processed ways by which to relate to the speaker. MHA speaker provided handouts and educational information pertaining to groups and services offered by the [facility]. Pt was engaged in speaker's presentation and was receptive to resources provided.    [employer], LCSW 09/26/2016 11:40 AM

## 2016-09-26 NOTE — BHH Suicide Risk Assessment (Signed)
B[facility] INPATIENT:  Family/Significant Other Suicide Prevention Education  Suicide Prevention Education:  Contact Attempts: Lanny Lipkin (pt's father) [phone] has been identified by the patient as the family member/significant other with whom the patient will be residing, and identified as the person(s) who will aid the patient in the event of a mental health crisis.  With written consent from the patient, two attempts were made to provide suicide prevention education, prior to and/or following the patient's discharge.  We were unsuccessful in providing suicide prevention education.  A suicide education pamphlet was given to the patient to share with family/significant other.  Date and time of first attempt: 09/26/16 at 10:04AM (unable to leave voicemail).  Date and time of second attempt: 09/26/16 at 1:35PM (unable to leave voicemail).   Greg Cratty N Smart 09/26/2016, 10:05 AM

## 2016-09-26 NOTE — Progress Notes (Signed)
DAR NOTE: Patient presents with anxious affect and mood.  Denies pain, auditory and visual hallucinations.  Described energy level as hyper and concentration as good.  Rates depression at 0, hopelessness at 0, and anxiety at 0.  Maintained on routine safety checks.  Medications given as prescribed.  Support and encouragement offered as needed.  Attended group and participated.  States goal for today is "to eat and gain weight."  Patient observed socializing with peers in the dayroom.  Patient still preoccupied with getting discharge.  Offered no complaint.

## 2016-09-27 ENCOUNTER — Encounter (HOSPITAL_COMMUNITY): Payer: Self-pay | Admitting: Psychiatry

## 2016-09-27 DIAGNOSIS — F1021 Alcohol dependence, in remission: Secondary | ICD-10-CM | POA: Diagnosis present

## 2016-09-27 DIAGNOSIS — F1020 Alcohol dependence, uncomplicated: Secondary | ICD-10-CM | POA: Diagnosis present

## 2016-09-27 MED ORDER — NALTREXONE HCL 50 MG PO TABS: 50.0000 mg | ORAL_TABLET | Freq: Every day | ORAL | 0 refills | Status: DC

## 2016-09-27 MED ORDER — OMEGA-3-ACID ETHYL ESTERS 1 G PO CAPS: 1.0000 g | ORAL_CAPSULE | Freq: Two times a day (BID) | ORAL | 0 refills | Status: DC

## 2016-09-27 MED ORDER — TRAZODONE HCL 50 MG PO TABS: 50.0000 mg | ORAL_TABLET | Freq: Every evening | ORAL | 0 refills | Status: DC | PRN

## 2016-09-27 MED ORDER — ESCITALOPRAM OXALATE 10 MG PO TABS: 10.0000 mg | ORAL_TABLET | Freq: Every day | ORAL | 0 refills | Status: DC

## 2016-09-27 NOTE — Progress Notes (Signed)
Patient ID: Alex Logan, male   DOB: 18-Mar-1965, 51 y.o.   MRN: [id number]  Discharge Note: Belongings returned to patient at time of discharge. Discharge instructions and medications were reviewed with patient. Patient verbalized understanding of both medications and discharge instructions. Patient discharged to lobby where his ride was waiting. Q15 minute safety checks maintained until time of discharge. No distress upon discharge.

## 2016-09-27 NOTE — Progress Notes (Signed)
Recreation Therapy Notes  Date: 09/27/16 Time: 0930 Location: [address] Dayroom  Group Topic: Stress Management  Goal Area(s) Addresses:  Patient will verbalize importance of using healthy stress management.  Patient will identify positive emotions associated with healthy stress management.   Behavioral Response: Engaged  Intervention: Stress Management  Activity :  Progressive Muscle Relaxation.  LRT introduced the stress management technique of progressive muscle relaxation.  LRT read a script that instructed patients to tense then relax each muscle group individually.  Patients were to follow along as the script was read to fully engage in the practice.  Education:  Stress Management, Discharge Planning.   Education Outcome: Acknowledges edcuation/In group clarification offered/Needs additional education  Clinical Observations/Feedback: Pt attended group.    Caroll Rancher, LRT/CTRS        Caroll Rancher A 09/27/2016 12:05 PM

## 2016-09-27 NOTE — Progress Notes (Signed)
  [facility] Adult Case Management Discharge Plan :  Will you be returning to the same living situation after discharge:  Yes,  home' At discharge, do you have transportation home?: Yes,  mother/family member Do you have the ability to pay for your medications: Yes,  mental health  Release of information consent forms completed and submitted to medical records by CSW.  Patient to Follow up at: Follow-up Information    [facility] Follow up on 10/02/2016.   Specialty:  Behavioral Health Why:  Counseling appt on Monday 9/24 at 9:00AM with Alema. Please bring: Photo ID. Thank you. Contact information: [address] [place] [address] [phone]        [facility] Follow up on 09/29/2016.   Specialty:  Behavioral Health Why:  Hospital follow-up on Friday at 8:00AM. Please bring: picture ID and hospital discharge paperwork provided to you. Thank you.  Contact information: [address] [place] [address] [phone]           Next level of care provider has access to [facility] Link:no  Safety Planning and Suicide Prevention discussed: Yes,  SPE completed with pt; contact attempts made with pt's father. SPI pamphlet and Mobile Crisis information provided.  Have you used any form of tobacco in the last 30 days? (Cigarettes, Smokeless Tobacco, Cigars, and/or Pipes): No  Has patient been referred to the Quitline?: N/A patient is not a smoker  Patient has been referred for addiction treatment: Yes  [employer], LCSW 09/27/2016, 10:32 AM

## 2016-09-27 NOTE — Discharge Summary (Signed)
Physician Discharge Summary Note  Patient:  Alex Logan is an 51 y.o., male MRN:  [id number] DOB:  December 01, 1965 Patient phone:  [phone] (home)  Patient address:   [address] [place] [address],  Total Time spent with patient: 30 minutes  Date of Admission:  09/24/2016 Date of Discharge:  09/27/2016  Reason for Admission:Per HPI-51 y.o Caucasian male, divorced, lives alone, self employed . Known history of AUD. Presented to the ER via the police. His father called and alerted the police after patient threatened to shoot himself. He had a rifle at home and attempted to use it when the police got to his place. Patient was intoxicated with alcohol. BAL was 336 mg/dl. UDS was negative. CMP normal electrolytes but mildly elevated of liver enzymes. At interview, patient reports a strong family history of alcoholism. Says his grand father and father are all alcoholics. Says he has been drinking for decades. Daily pattern of drinking. Drinks at home. Says his drinking has been affecting his relationships over the years. It affected his marriage. Says it has been affecting his current relationship. Says he argues a lot with his current girlfriend. Says he became very emotional while intoxicated. Says he has never felt suicidal or threatened to hurt himself in the past. Says he was intoxicated when he felt that way. He is glad he is in a safe place. Says he wants to use this opportunity to work on his alcoholism. Says he just turned [age] and does not have any medical issues. Says his grandmother is over ninety and she is in good health. Knows that alcohol would make him age faster. I shared his liver enzymes with him and patient felt more motivated to deal with his addiction. Reports missing work some days as a result of effects of alcohol. Does not use any other street substance.  He has been in the hospital system for a couple of days. He has been getting Ativan. Shakes are less. No   sweatiness, no headaches. No retching, nausea or vomiting. No fullness in the head. No visual, tactile or auditory hallucination. No internal restlessness. No current suicidal thoughts. No homicidal thoughts. No thoughts of violence. Says he has only one gun and the police have taken it. He has two DUI's and a pending court date for it. No other stressors.   Principal Problem: Alcohol abuse with alcohol-induced mood disorder [facility]) Discharge Diagnoses: Patient Active Problem List   Diagnosis Date Noted  . Alcohol use disorder, severe, dependence (HCC) [F10.20] 09/27/2016  . Alcohol abuse with alcohol-induced mood disorder [facility]) [F10.14] 09/22/2016    Past Psychiatric History:   Past Medical History: History reviewed. No pertinent past medical history. History reviewed. No pertinent surgical history. Family History:  Family History  Problem Relation Age of Onset  . Mental illness Neg Hx    Family Psychiatric  History:  Social History:  History  Alcohol Use  . Yes    Comment: vodka every other day     History  Drug Use No    Social History   Social History  . Marital status: Divorced    Spouse name: N/A  . Number of children: N/A  . Years of education: N/A   Social History Main Topics  . Smoking status: Never Smoker  . Smokeless tobacco: Never Used  . Alcohol use Yes     Comment: vodka every other day  . Drug use: No  . Sexual activity: Not Asked   Other Topics  Concern  . None   Social History Narrative  . None    Hospital Course:  Alex Logan was admitted for Alcohol abuse with alcohol-induced mood disorder (HCC) and crisis management.  Pt was treated discharged with the medications listed below under Medication List.  Medical problems were identified and treated as needed.  Home medications were restarted as appropriate.  Improvement was monitored by observation and Harley Hallmark 's daily report of symptom reduction.  Emotional and mental status was  monitored by daily self-inventory reports completed by Harley Hallmark and clinical staff.         Alex Logan was evaluated by the treatment team for stability and plans for continued recovery upon discharge. Alex Logan 's motivation was an integral factor for scheduling further treatment. Employment, transportation, bed availability, health status, family support, and any pending legal issues were also considered during hospital stay. Pt was offered further treatment options upon discharge including but not limited to Residential, Intensive Outpatient, and Outpatient treatment.  Alex Logan will follow up with the services as listed below under Follow Up Information.     Upon completion of this admission the patient was both mentally and medically stable for discharge denying suicidal/homicidal ideation, auditory/visual/tactile hallucinations, delusional thoughts and paranoia.    Alex Logan responded well to treatment with Lexapro 10 mg and  Depade 50 mg without adverse effects. Pt demonstrated improvement without reported or observed adverse effects to the point of stability appropriate for outpatient management. Pertinent labs include: Ammonia, CMP for which outpatient follow-up is necessary for lab recheck as mentioned below. Reviewed CBC, CMP, BAL+336 on admission, and UDS; all unremarkable aside from noted exceptions.   Physical Findings: AIMS: Facial and Oral Movements Muscles of Facial Expression: None, normal Lips and Perioral Area: None, normal Jaw: None, normal Tongue: None, normal,Extremity Movements Upper (arms, wrists, hands, fingers): None, normal Lower (legs, knees, ankles, toes): None, normal, Trunk Movements Neck, shoulders, hips: None, normal, Overall Severity Severity of abnormal movements (highest score from questions above): None, normal Incapacitation due to abnormal movements: None, normal Patient's awareness of abnormal movements (rate only  patient's report): No Awareness, Dental Status Current problems with teeth and/or dentures?: No Does patient usually wear dentures?: No  CIWA:  CIWA-Ar Total: 0 COWS:     Musculoskeletal: Strength & Muscle Tone: within normal limits Gait & Station: normal Patient leans: N/A  Psychiatric Specialty Exam: See SRA by MD Physical Exam  Vitals reviewed. Constitutional: He appears well-developed.  Cardiovascular: Normal rate.   Neurological: He is alert.  Psychiatric: He has a normal mood and affect. His behavior is normal.    Review of Systems  Psychiatric/Behavioral: Negative for depression (stable) and suicidal ideas. The patient is not nervous/anxious (stable).     Blood pressure 138/88, pulse 84, temperature 98.6 F (37 C), temperature source Oral, resp. rate 16, height 5' 9.75" (1.772 m), weight 65.1 kg (143 lb 8 oz).Body mass index is 20.74 kg/m.  Have you used any form of tobacco in the last 30 days? (Cigarettes, Smokeless Tobacco, Cigars, and/or Pipes): No  Has this patient used any form of tobacco in the last 30 days? (Cigarettes, Smokeless Tobacco, Cigars, and/or Pipes)  No  Blood Alcohol level:  Lab Results  Component Value Date   ETH 200 (H) 09/22/2016   ETH 336 (HH) 09/22/2016    Metabolic Disorder Labs:  No results found for: HGBA1C, MPG No results found for: PROLACTIN No results found for: CHOL,  TRIG, HDL, CHOLHDL, VLDL, LDLCALC  See Psychiatric Specialty Exam and Suicide Risk Assessment completed by Attending Physician prior to discharge.  Discharge destination:  Home  Is patient on multiple antipsychotic therapies at discharge:  No   Has Patient had three or more failed trials of antipsychotic monotherapy by history:  No  Recommended Plan for Multiple Antipsychotic Therapies: NA  Discharge Instructions    Diet - low sodium heart healthy    Complete by:  As directed    Discharge instructions    Complete by:  As directed    Take all medications as  prescribed. Keep all follow-up appointments as scheduled.  Do not consume alcohol or use illegal drugs while on prescription medications. Report any adverse effects from your medications to your primary care provider promptly.  In the event of recurrent symptoms or worsening symptoms, call 911, a crisis hotline, or go to the nearest emergency department for evaluation.   Increase activity slowly    Complete by:  As directed      Allergies as of 09/27/2016   No Known Allergies     Medication List    STOP taking these medications   meclizine 25 MG tablet Commonly known as:  ANTIVERT     TAKE these medications     Indication  escitalopram 10 MG tablet Commonly known as:  LEXAPRO Take 1 tablet (10 mg total) by mouth daily. What changed:  medication strength  how much to take  Indication:  Generalized Anxiety Disorder   naltrexone 50 MG tablet Commonly known as:  DEPADE Take 1 tablet (50 mg total) by mouth daily.  Indication:  Excessive Use of Alcohol   omega-3 acid ethyl esters 1 g capsule Commonly known as:  LOVAZA Take 1 capsule (1 g total) by mouth 2 (two) times daily.  Indication:  High Amount of Triglycerides in the Blood   traZODone 50 MG tablet Commonly known as:  DESYREL Take 1 tablet (50 mg total) by mouth at bedtime and may repeat dose one time if needed.  Indication:  Trouble Sleeping      Follow-up Information    [facility] Follow up on 10/02/2016.   Specialty:  Behavioral Health Why:  Counseling appt on Monday 9/24 at 9:00AM with Alema. Please bring: Photo ID. Thank you. Contact information: [address] [place] [address] [phone]        [facility] Follow up on 09/29/2016.   Specialty:  Behavioral Health Why:  Hospital follow-up on Friday at 8:00AM. Please bring: picture ID and hospital discharge paperwork provided to you. Thank you.  [address] [place]  [address] [phone]           Follow-up recommendations:  Activity:  as tolerated Diet:  heart healthy  Comments:  Take all medications as prescribed. Keep all follow-up appointments as scheduled.  Do not consume alcohol or use illegal drugs while on prescription medications. Report any adverse effects from your medications to your primary care provider promptly.  In the event of recurrent symptoms or worsening symptoms, call 911, a crisis hotline, or go to the nearest emergency department for evaluation.   Signed: Oneta Rack, NP 09/27/2016, 9:44 AM

## 2016-09-27 NOTE — Progress Notes (Signed)
Patient ID: Alex Logan, male   DOB: 1965/12/10, 50 y.o.   MRN: [id number]  DAR: Pt. Denies SI/HI and A/V Hallucinations. He reports sleep is fair, appetite is good, energy level is high, and concentration is good. He rates depression 0/10, hopelessness 0/10, and anxiety 0/10. Patient does not report any pain or discomfort at this time. Support and encouragement provided to the patient. Patient is cooperative and is seen interacting in the milieu appropriately. He reports that he is ready for discharge. Q15 minute checks are maintained for safety.

## 2016-09-27 NOTE — [facility]HH Suicide Risk Assessment (Signed)
[facility] Discharge Suicide Risk Assessment   Principal Problem: Alcohol abuse with alcohol-induced mood disorder [facility]) Discharge Diagnoses:  Patient Active Problem List   Diagnosis Date Noted  . Alcohol use disorder, severe, dependence (HCC) [F10.20] 09/27/2016  . Alcohol abuse with alcohol-induced mood disorder (HCC) [F10.14] 09/22/2016    Total Time spent with patient: 30 minutes  Musculoskeletal: Strength & Muscle Tone: within normal limits Gait & Station: normal Patient leans: N/A  Psychiatric Specialty Exam: Review of Systems  Psychiatric/Behavioral: Positive for substance abuse. Negative for depression, hallucinations and suicidal ideas. The patient is not nervous/anxious.   All other systems reviewed and are negative.   Blood pressure 138/88, pulse 84, temperature 98.6 F (37 C), temperature source Oral, resp. rate 16, height 5' 9.75" (1.772 m), weight 65.1 kg (143 lb 8 oz).Body mass index is 20.74 kg/m.  General Appearance: Casual  Eye Contact::  Fair  Speech:  Clear and Coherent409  Volume:  Normal  Mood:  Euthymic  Affect:  Congruent  Thought Process:  Goal Directed and Descriptions of Associations: Intact  Orientation:  Full (Time, Place, and Person)  Thought Content:  Logical  Suicidal Thoughts:  No  Homicidal Thoughts:  No  Memory:  Immediate;   Fair Recent;   Fair Remote;   Fair  Judgement:  Fair  Insight:  Fair  Psychomotor Activity:  Tremor  Concentration:  Fair  Recall:  [employer] of Knowledge:Fair  Language: Fair  Akathisia:  No  Handed:  Right  AIMS (if indicated):   has mild tremors likely from alcohol withdrawal  Assets:  Communication Skills Desire for Improvement  Sleep:  Number of Hours: 6.5  Cognition: WNL  ADL's:  Intact   Mental Status Per Nursing Assessment::   On Admission:  Suicidal ideation indicated by patient, Suicide plan, Plan includes specific time, place, or method, Self-harm thoughts, Intention to act on suicide plan, Belief  that plan would result in death  Demographic Factors:  Male and Caucasian  Loss Factors: NA  Historical Factors: Impulsivity  Risk Reduction Factors:   Positive social support and Positive therapeutic relationship  Continued Clinical Symptoms:  Alcohol/Substance Abuse/Dependencies  Cognitive Features That Contribute To Risk:  None    Suicide Risk:  Minimal: No identifiable suicidal ideation.  Patients presenting with no risk factors but with morbid ruminations; may be classified as minimal risk based on the severity of the depressive symptoms  Follow-up Information    [facility] Follow up on 10/02/2016.   Specialty:  Behavioral Health Why:  Counseling appt on Monday 9/24 at 9:00AM with Alema. Please bring: Photo ID. Thank you. Contact information: [address] [place] [address] [phone]        [facility] Follow up on 09/29/2016.   Specialty:  Behavioral Health Why:  Hospital follow-up on Friday at 8:00AM. Please bring: picture ID and hospital discharge paperwork provided to you. Thank you.  Contact information: [address] [place] [address] [phone]           Plan Of Care/Follow-up recommendations:  Activity:  no restrictions Diet:  regular Tests:  as needed Other:  follow up with aftercare  Lashone Stauber, MD 09/27/2016, 9:36 AM

## 2016-11-10 ENCOUNTER — Inpatient Hospital Stay (HOSPITAL_COMMUNITY)
Admission: EM | Admit: 2016-11-10 | Discharge: 2016-11-15 | DRG: 897 | Disposition: A | Discharge status: Home / Self Care | Payer: Managed Care, Other (non HMO) | Attending: Internal Medicine | Admitting: Internal Medicine

## 2016-11-10 ENCOUNTER — Encounter (HOSPITAL_COMMUNITY): Payer: Self-pay

## 2016-11-10 ENCOUNTER — Emergency Department (HOSPITAL_COMMUNITY): Payer: Managed Care, Other (non HMO)

## 2016-11-10 DIAGNOSIS — L89312 Pressure ulcer of right buttock, stage 2: Secondary | ICD-10-CM | POA: Diagnosis present

## 2016-11-10 DIAGNOSIS — R1110 Vomiting, unspecified: Secondary | ICD-10-CM

## 2016-11-10 DIAGNOSIS — L89322 Pressure ulcer of left buttock, stage 2: Secondary | ICD-10-CM | POA: Diagnosis present

## 2016-11-10 DIAGNOSIS — E860 Dehydration: Secondary | ICD-10-CM | POA: Diagnosis present

## 2016-11-10 DIAGNOSIS — Z79899 Other long term (current) drug therapy: Secondary | ICD-10-CM

## 2016-11-10 DIAGNOSIS — R748 Abnormal levels of other serum enzymes: Secondary | ICD-10-CM | POA: Diagnosis present

## 2016-11-10 DIAGNOSIS — F10239 Alcohol dependence with withdrawal, unspecified: Secondary | ICD-10-CM | POA: Diagnosis present

## 2016-11-10 DIAGNOSIS — E872 Acidosis, unspecified: Secondary | ICD-10-CM

## 2016-11-10 DIAGNOSIS — L8990 Pressure ulcer of unspecified site, unspecified stage: Secondary | ICD-10-CM

## 2016-11-10 DIAGNOSIS — F10231 Alcohol dependence with withdrawal delirium: Principal | ICD-10-CM | POA: Diagnosis present

## 2016-11-10 DIAGNOSIS — F329 Major depressive disorder, single episode, unspecified: Secondary | ICD-10-CM | POA: Diagnosis present

## 2016-11-10 DIAGNOSIS — R066 Hiccough: Secondary | ICD-10-CM | POA: Diagnosis not present

## 2016-11-10 DIAGNOSIS — E875 Hyperkalemia: Secondary | ICD-10-CM | POA: Diagnosis present

## 2016-11-10 DIAGNOSIS — L249 Irritant contact dermatitis, unspecified cause: Secondary | ICD-10-CM | POA: Diagnosis present

## 2016-11-10 DIAGNOSIS — E871 Hypo-osmolality and hyponatremia: Secondary | ICD-10-CM

## 2016-11-10 DIAGNOSIS — L89152 Pressure ulcer of sacral region, stage 2: Secondary | ICD-10-CM | POA: Diagnosis present

## 2016-11-10 DIAGNOSIS — I10 Essential (primary) hypertension: Secondary | ICD-10-CM | POA: Diagnosis present

## 2016-11-10 DIAGNOSIS — F10939 Alcohol use, unspecified with withdrawal, unspecified: Secondary | ICD-10-CM | POA: Diagnosis present

## 2016-11-10 DIAGNOSIS — F419 Anxiety disorder, unspecified: Secondary | ICD-10-CM | POA: Diagnosis present

## 2016-11-10 DIAGNOSIS — R6510 Systemic inflammatory response syndrome (SIRS) of non-infectious origin without acute organ dysfunction: Secondary | ICD-10-CM

## 2016-11-10 DIAGNOSIS — I160 Hypertensive urgency: Secondary | ICD-10-CM | POA: Diagnosis not present

## 2016-11-10 DIAGNOSIS — E8729 Other acidosis: Secondary | ICD-10-CM

## 2016-11-10 DIAGNOSIS — N179 Acute kidney failure, unspecified: Secondary | ICD-10-CM | POA: Diagnosis present

## 2016-11-10 DIAGNOSIS — R739 Hyperglycemia, unspecified: Secondary | ICD-10-CM | POA: Diagnosis present

## 2016-11-10 DIAGNOSIS — T730XXA Starvation, initial encounter: Secondary | ICD-10-CM

## 2016-11-10 HISTORY — DX: Alcohol abuse, uncomplicated: F10.10

## 2016-11-10 LAB — I-STAT ARTERIAL BLOOD GAS, ED
ACID-BASE EXCESS: 6 mmol/L — AB (ref 0.0–2.0)
BICARBONATE: 26.6 mmol/L (ref 20.0–28.0)
O2 SAT: 99 %
PH ART: 7.604 — AB (ref 7.350–7.450)
PO2 ART: 94 mmHg (ref 83.0–108.0)
TCO2: 27 mmol/L (ref 22–32)
pCO2 arterial: 26.8 mmHg — ABNORMAL LOW (ref 32.0–48.0)

## 2016-11-10 LAB — CBC WITH DIFFERENTIAL/PLATELET
Basophils Absolute: 0 10*3/uL (ref 0.0–0.1)
Basophils Relative: 0 %
EOS PCT: 0 %
Eosinophils Absolute: 0 10*3/uL (ref 0.0–0.7)
HEMATOCRIT: 43.6 % (ref 39.0–52.0)
Hemoglobin: 16.1 g/dL (ref 13.0–17.0)
LYMPHS ABS: 1.2 10*3/uL (ref 0.7–4.0)
Lymphocytes Relative: 8 %
MCH: 33.5 pg (ref 26.0–34.0)
MCHC: 36.9 g/dL — AB (ref 30.0–36.0)
MCV: 90.8 fL (ref 78.0–100.0)
Monocytes Absolute: 0.4 10*3/uL (ref 0.1–1.0)
Monocytes Relative: 3 %
NEUTROS ABS: 12.8 10*3/uL — AB (ref 1.7–7.7)
Neutrophils Relative %: 89 %
Platelets: 335 10*3/uL (ref 150–400)
RBC: 4.8 MIL/uL (ref 4.22–5.81)
RDW: 14.2 % (ref 11.5–15.5)
WBC: 14.4 10*3/uL — AB (ref 4.0–10.5)

## 2016-11-10 LAB — COMPREHENSIVE METABOLIC PANEL
ALT: 80 U/L — ABNORMAL HIGH (ref 17–63)
ANION GAP: 27 — AB (ref 5–15)
AST: 67 U/L — AB (ref 15–41)
Albumin: 4.5 g/dL (ref 3.5–5.0)
Alkaline Phosphatase: 58 U/L (ref 38–126)
BILIRUBIN TOTAL: 2.4 mg/dL — AB (ref 0.3–1.2)
BUN: 39 mg/dL — AB (ref 6–20)
CHLORIDE: 79 mmol/L — AB (ref 101–111)
CO2: 20 mmol/L — ABNORMAL LOW (ref 22–32)
Calcium: 9.5 mg/dL (ref 8.9–10.3)
Creatinine, Ser: 2.03 mg/dL — ABNORMAL HIGH (ref 0.61–1.24)
GFR, EST AFRICAN AMERICAN: 42 mL/min — AB (ref >60)
GFR, EST NON AFRICAN AMERICAN: 37 mL/min — AB (ref >60)
Glucose, Bld: 235 mg/dL — ABNORMAL HIGH (ref 65–99)
POTASSIUM: 4.5 mmol/L (ref 3.5–5.1)
Sodium: 126 mmol/L — ABNORMAL LOW (ref 135–145)
TOTAL PROTEIN: 7.6 g/dL (ref 6.5–8.1)

## 2016-11-10 LAB — I-STAT CG4 LACTIC ACID, ED
LACTIC ACID, VENOUS: 15 mmol/L — AB (ref 0.5–1.9)
LACTIC ACID, VENOUS: 1.87 mmol/L (ref 0.5–1.9)

## 2016-11-10 LAB — I-STAT CHEM 8, ED
BUN: 54 mg/dL — AB (ref 6–20)
Calcium, Ion: 0.91 mmol/L — ABNORMAL LOW (ref 1.15–1.40)
Chloride: 84 mmol/L — ABNORMAL LOW (ref 101–111)
Creatinine, Ser: 1.8 mg/dL — ABNORMAL HIGH (ref 0.61–1.24)
Glucose, Bld: 249 mg/dL — ABNORMAL HIGH (ref 65–99)
HEMATOCRIT: 49 % (ref 39.0–52.0)
Hemoglobin: 16.7 g/dL (ref 13.0–17.0)
Potassium: 6.1 mmol/L — ABNORMAL HIGH (ref 3.5–5.1)
SODIUM: 123 mmol/L — AB (ref 135–145)
TCO2: 23 mmol/L (ref 22–32)

## 2016-11-10 LAB — URINALYSIS, ROUTINE W REFLEX MICROSCOPIC
BACTERIA UA: NONE SEEN
Bilirubin Urine: NEGATIVE
GLUCOSE, UA: NEGATIVE mg/dL
Ketones, ur: 5 mg/dL — AB
LEUKOCYTES UA: NEGATIVE
NITRITE: NEGATIVE
PROTEIN: 100 mg/dL — AB
SPECIFIC GRAVITY, URINE: 1.027 (ref 1.005–1.030)
Squamous Epithelial / LPF: NONE SEEN
pH: 5 (ref 5.0–8.0)

## 2016-11-10 LAB — BASIC METABOLIC PANEL
Anion gap: 15 (ref 5–15)
Anion gap: 11 (ref 5–15)
BUN: 37 mg/dL — AB (ref 6–20)
BUN: 27 mg/dL — AB (ref 6–20)
CALCIUM: 8.4 mg/dL — AB (ref 8.9–10.3)
CHLORIDE: 89 mmol/L — AB (ref 101–111)
CHLORIDE: 95 mmol/L — AB (ref 101–111)
CO2: 23 mmol/L (ref 22–32)
CO2: 23 mmol/L (ref 22–32)
CREATININE: 1.44 mg/dL — AB (ref 0.61–1.24)
CREATININE: 1.15 mg/dL (ref 0.61–1.24)
Calcium: 8.3 mg/dL — ABNORMAL LOW (ref 8.9–10.3)
GFR calc Af Amer: >60 mL/min (ref >60)
GFR calc Af Amer: >60 mL/min (ref >60)
GFR calc non Af Amer: 55 mL/min — ABNORMAL LOW (ref >60)
GFR calc non Af Amer: >60 mL/min (ref >60)
Glucose, Bld: 98 mg/dL (ref 65–99)
Glucose, Bld: 138 mg/dL — ABNORMAL HIGH (ref 65–99)
Potassium: 4.9 mmol/L (ref 3.5–5.1)
Potassium: 4.1 mmol/L (ref 3.5–5.1)
SODIUM: 127 mmol/L — AB (ref 135–145)
Sodium: 129 mmol/L — ABNORMAL LOW (ref 135–145)

## 2016-11-10 LAB — RAPID URINE DRUG SCREEN, HOSP PERFORMED
AMPHETAMINES: NOT DETECTED
BENZODIAZEPINES: NOT DETECTED
Barbiturates: NOT DETECTED
Cocaine: NOT DETECTED
Opiates: NOT DETECTED
TETRAHYDROCANNABINOL: NOT DETECTED

## 2016-11-10 LAB — PROCALCITONIN: PROCALCITONIN: 0.67 ng/mL

## 2016-11-10 LAB — HEMOGLOBIN A1C
Hgb A1c MFr Bld: 5.3 % (ref 4.8–5.6)
MEAN PLASMA GLUCOSE: 105.41 mg/dL

## 2016-11-10 LAB — LIPASE, BLOOD: LIPASE: 57 U/L — AB (ref 11–51)

## 2016-11-10 LAB — ETHANOL: Alcohol, Ethyl (B): <10 mg/dL (ref <10)

## 2016-11-10 MED ORDER — HEPARIN SODIUM (PORCINE) 5000 UNIT/ML IJ SOLN
5000.0000 [IU] | Freq: Three times a day (TID) | INTRAMUSCULAR | Status: DC
  Administered 2016-11-10 – 2016-11-15 (×14): 5000 [IU] via SUBCUTANEOUS
  Filled 2016-11-10 (×14): qty 1

## 2016-11-10 MED ORDER — SODIUM CHLORIDE 0.9 % IV BOLUS (SEPSIS)
1000.0000 mL | Freq: Once | INTRAVENOUS | Status: AC
  Administered 2016-11-10: 1000 mL via INTRAVENOUS

## 2016-11-10 MED ORDER — SODIUM CHLORIDE 0.9 % IV BOLUS (SEPSIS)
2000.0000 mL | Freq: Once | INTRAVENOUS | Status: AC
Start: 2016-11-10 — End: 2016-11-10
  Administered 2016-11-10: 2000 mL via INTRAVENOUS

## 2016-11-10 MED ORDER — LORAZEPAM 2 MG/ML IJ SOLN
1.0000 mg | Freq: Once | INTRAMUSCULAR | Status: AC
  Administered 2016-11-10: 1 mg via INTRAVENOUS
  Filled 2016-11-10: qty 1

## 2016-11-10 MED ORDER — LORAZEPAM 2 MG/ML IJ SOLN
2.0000 mg | INTRAMUSCULAR | Status: DC | PRN
  Administered 2016-11-10 – 2016-11-15 (×14): 2 mg via INTRAVENOUS
  Filled 2016-11-10 (×16): qty 1

## 2016-11-10 MED ORDER — PANTOPRAZOLE SODIUM 40 MG IV SOLR
40.0000 mg | Freq: Once | INTRAVENOUS | Status: AC
  Administered 2016-11-10: 40 mg via INTRAVENOUS
  Filled 2016-11-10: qty 40

## 2016-11-10 MED ORDER — LORAZEPAM 2 MG/ML IJ SOLN: 1.0000 mg | Freq: Once | INTRAMUSCULAR | Status: DC

## 2016-11-10 MED ORDER — THIAMINE HCL 100 MG/ML IJ SOLN
Freq: Once | INTRAVENOUS | Status: AC
  Administered 2016-11-10: 19:00:00 via INTRAVENOUS
  Filled 2016-11-10: qty 1000

## 2016-11-10 NOTE — ED Provider Notes (Signed)
[facility] EMERGENCY DEPARTMENT Provider Note   CSN: [id number] Arrival date & time: 11/10/16  1405     History   Chief Complaint Chief Complaint  Patient presents with  . withdrawal  . Delirium Tremens (DTS)    HPI Alex Logan is a 51 y.o. male.  Patient comes in with a history of vomiting for the last couple days.  He was found by his friend on the floor.  Patient has a long history of alcohol abuse in the last 5 months he has been drinking at least 2 bottles of wine a day.   The history is provided by the patient. No language interpreter was used.  Emesis   This is a recurrent problem. The current episode started more than 2 days ago. The problem occurs 5 to 10 times per day. The problem has not changed since onset.The emesis has an appearance of stomach contents. There has been no fever. Pertinent negatives include no abdominal pain, no chills, no cough, no diarrhea and no headaches.    Past Medical History:  Diagnosis Date  . Alcohol abuse     Patient Active Problem List   Diagnosis Date Noted  . Alcohol use disorder, severe, dependence (HCC) 09/27/2016  . Alcohol abuse with alcohol-induced mood disorder (HCC) 09/22/2016    History reviewed. No pertinent surgical history.     Home Medications    Prior to Admission medications   Medication Sig Start Date End Date Taking? Authorizing Provider  escitalopram (LEXAPRO) 10 MG tablet Take 1 tablet (10 mg total) by mouth daily. 09/28/16   Oneta Rack, NP  naltrexone (DEPADE) 50 MG tablet Take 1 tablet (50 mg total) by mouth daily. 09/28/16   Oneta Rack, NP  omega-3 acid ethyl esters (LOVAZA) 1 g capsule Take 1 capsule (1 g total) by mouth 2 (two) times daily. 09/27/16   Oneta Rack, NP  traZODone (DESYREL) 50 MG tablet Take 1 tablet (50 mg total) by mouth at bedtime and may repeat dose one time if needed. 09/27/16   Oneta Rack, NP    Family History Family History  Problem  Relation Age of Onset  . Mental illness Neg Hx     Social History Social History  Substance Use Topics  . Smoking status: Never Smoker  . Smokeless tobacco: Never Used  . Alcohol use Yes     Comment: vodka every other day     Allergies   Patient has no known allergies.   Review of Systems Review of Systems  Constitutional: Negative for appetite change, chills and fatigue.  HENT: Negative for congestion, ear discharge and sinus pressure.   Eyes: Negative for discharge.  Respiratory: Negative for cough.   Cardiovascular: Negative for chest pain.  Gastrointestinal: Positive for vomiting. Negative for abdominal pain and diarrhea.  Genitourinary: Negative for frequency and hematuria.  Musculoskeletal: Negative for back pain.  Skin: Negative for rash.  Neurological: Negative for seizures and headaches.  Psychiatric/Behavioral: Negative for hallucinations.     Physical Exam Updated Vital Signs BP (!) 172/106   Pulse (!) 123   Temp 98 F (36.7 C) (Oral)   Resp (!) 21   Ht 5\' 10"  (1.778 m)   Wt 68 kg (150 lb)   SpO2 98%   BMI 21.52 kg/m   Physical Exam  Constitutional: He is oriented to person, place, and time. He appears well-developed.  HENT:  Head: Normocephalic.  Mucous membranes dry  Eyes: Conjunctivae and EOM  are normal. No scleral icterus.  Neck: Neck supple. No thyromegaly present.  Cardiovascular: Normal rate and regular rhythm.  Exam reveals no gallop and no friction rub.   No murmur heard. Pulmonary/Chest: No stridor. He has no wheezes. He has no rales. He exhibits no tenderness.  Abdominal: He exhibits no distension. There is no tenderness. There is no rebound.  Musculoskeletal: Normal range of motion. He exhibits no edema.  Lymphadenopathy:    He has no cervical adenopathy.  Neurological: He is oriented to person, place, and time. He exhibits normal muscle tone. Coordination normal.  Patient has tremors throughout  Skin: No rash noted. No erythema.    Psychiatric:  Patient is anxious     ED Treatments / Results  Labs (all labs ordered are listed, but only abnormal results are displayed) Labs Reviewed  CBC WITH DIFFERENTIAL/PLATELET - Abnormal; Notable for the following:       Result Value   WBC 14.4 (*)    MCHC 36.9 (*)    Neutro Abs 12.8 (*)    All other components within normal limits  COMPREHENSIVE METABOLIC PANEL - Abnormal; Notable for the following:    Sodium 126 (*)    Chloride 79 (*)    CO2 20 (*)    Glucose, Bld 235 (*)    BUN 39 (*)    Creatinine, Ser 2.03 (*)    AST 67 (*)    ALT 80 (*)    Total Bilirubin 2.4 (*)    GFR calc non Af Amer 37 (*)    GFR calc Af Amer 42 (*)    Anion gap 27 (*)    All other components within normal limits  URINALYSIS, ROUTINE W REFLEX MICROSCOPIC - Abnormal; Notable for the following:    Color, Urine AMBER (*)    APPearance HAZY (*)    Hgb urine dipstick SMALL (*)    Ketones, ur 5 (*)    Protein, ur 100 (*)    All other components within normal limits  LIPASE, BLOOD - Abnormal; Notable for the following:    Lipase 57 (*)    All other components within normal limits  I-STAT CHEM 8, ED - Abnormal; Notable for the following:    Sodium 123 (*)    Potassium 6.1 (*)    Chloride 84 (*)    BUN 54 (*)    Creatinine, Ser 1.80 (*)    Glucose, Bld 249 (*)    Calcium, Ion 0.91 (*)    All other components within normal limits  I-STAT CG4 LACTIC ACID, ED - Abnormal; Notable for the following:    Lactic Acid, Venous 15.00 (*)    All other components within normal limits  CULTURE, BLOOD (ROUTINE X 2)  CULTURE, BLOOD (ROUTINE X 2)  ETHANOL  RAPID URINE DRUG SCREEN, HOSP PERFORMED  I-STAT CG4 LACTIC ACID, ED  I-STAT CG4 LACTIC ACID, ED  I-STAT CG4 LACTIC ACID, ED    EKG  EKG Interpretation None       Radiology Dg Chest Port 1 View  Result Date: 11/10/2016 CLINICAL DATA:  Altered mental status and shaking. EXAM: PORTABLE CHEST 1 VIEW COMPARISON:  11/25/2010 radiographs  FINDINGS: The cardiomediastinal silhouette is unremarkable. There is no evidence of focal airspace disease, pulmonary edema, suspicious pulmonary nodule/mass, pleural effusion, or pneumothorax. No acute bony abnormalities are identified. IMPRESSION: No active disease. Electronically Signed   By: Harmon PierJeffrey  Hu M.D.   On: 11/10/2016 15:27    Procedures Procedures (including critical care time)  Medications Ordered in ED Medications  sodium chloride 0.9 % bolus 2,000 mL (0 mLs Intravenous Stopped 11/10/16 1624)  pantoprazole (PROTONIX) injection 40 mg (40 mg Intravenous Given 11/10/16 1445)  LORazepam (ATIVAN) injection 1 mg (1 mg Intravenous Given 11/10/16 1445)  sodium chloride 0.9 % bolus 1,000 mL (1,000 mLs Intravenous New Bag/Given 11/10/16 1601)  pantoprazole (PROTONIX) injection 40 mg (40 mg Intravenous Given 11/10/16 1552)  LORazepam (ATIVAN) injection 1 mg (1 mg Intravenous Given 11/10/16 1601)     Initial Impression / Assessment and Plan / ED Course  I have reviewed the triage vital signs and the nursing notes.  Pertinent labs & imaging results that were available during my care of the patient were reviewed by me and considered in my medical decision making (see chart for details).   Patient will be admitted for dehydration withdrawal symptoms from alcohol vomiting.  Critical care was consulted over the phone and it was determined the patient could be admitted by medicine to stepdown    Final Clinical Impressions(s) / ED Diagnoses   Final diagnoses:  Acute vomiting    New Prescriptions New Prescriptions   No medications on file     Bethann Berkshire, MD 11/10/16 [phone]

## 2016-11-10 NOTE — ED Notes (Signed)
PT requesting "something to help me sleep" at this time

## 2016-11-10 NOTE — H&P (Addendum)
History and Physical  Alex Logan ZOX:[id number] DOB: [birth year] DOA: 11/10/2016  Referring physician: Dr. Estell Harpin PCP: System, Pcp Not In  Outpatient Specialists: None Patient coming from: Home  Chief Complaint: Vomiting, diarrhea, and shaking  HPI: Alex Logan is a 51 y.o. male with medical history significant for alcohol abuse, depression/anxiety, who was discharged from [facility] 3 weeks ago for alcohol withdrawal who presented to ED [facility] with complaints of nausea, vomiting, and diarrhea. States he has not being able to drink his wine or eat due to persistent nausea. Onset early this morning before 6AM. Nausea with vomiting about 15 times today and watery stools throughout the day. Denies recent antibiotic use, abdominal cramps, fever, or chills. Denies cough, chest pain, or dyspnea. No reported seizure although his father states he had seizure movements 5 months ago.  ED Course: Meets criteria for SIRS with wbc 14.4, HR 123, lactic acidosis with lactic acid 15, High anion gap metabolic acidosis with Hco3- 20, anion gap 27, Hyponatremia, Na+ 126, AKI Cr 2.03- baseline cr 0.7, elevated AST and ALT and alcohol level less than 0.1 with suspected alcohol withdrawal. CXR unremarkable for acute findings as reviewed by self. U/A + ketones 5. IV fluid initiated in the ED.  Review of Systems: Denies acute visual changes Denies chest pain, shortness of breath, palpitations Denies cough, chest congestion Denies abdominal cramping, admits to nausea with vomiting, and diarrhea Denies dysuria, hematuria Denies joint pain Admits to tremors  Review of systems are otherwise negative   Past Medical History:  Diagnosis Date  . Alcohol abuse    History reviewed. No pertinent surgical history.  Social History:  reports that he has never smoked. He has never used smokeless tobacco. He reports that he drinks alcohol. He reports that he does not use drugs.   No Known  Allergies  Family History  Problem Relation Age of Onset  . Mental illness Neg Hx       Prior to Admission medications   Medication Sig Start Date End Date Taking? Authorizing Provider  escitalopram (LEXAPRO) 10 MG tablet Take 1 tablet (10 mg total) by mouth daily. 09/28/16   Oneta Rack, NP  naltrexone (DEPADE) 50 MG tablet Take 1 tablet (50 mg total) by mouth daily. 09/28/16   Oneta Rack, NP  omega-3 acid ethyl esters (LOVAZA) 1 g capsule Take 1 capsule (1 g total) by mouth 2 (two) times daily. 09/27/16   Oneta Rack, NP  traZODone (DESYREL) 50 MG tablet Take 1 tablet (50 mg total) by mouth at bedtime and may repeat dose one time if needed. 09/27/16   Oneta Rack, NP    Physical Exam: BP (!) 172/106   Pulse (!) 123   Temp 98 F (36.7 C) (Oral)   Resp (!) 21   Ht 5\' 10"  (1.778 m)   Wt 68 kg (150 lb)   SpO2 98%   BMI 21.52 kg/m   General:  51 year old caucasian male, thin built, laying on Doctor, general practice. Appears uncomfortable with persistent hiccups Eyes: Pupils round and reactive to light. Icteric sclerae  ENT: Dry mucous membranes, no erythema or exudates Neck: No JVD or thyromegaly Cardiovascular: RRR with no murmurs rubs or gallops  Respiratory: CTA with no wheezes or rhonchi Abdomen: Soft NT ND with NBS x 4 quadrants Skin: stage II pressure ulcer on sacrum and perineum, present on admission Musculoskeletal: Moves all limbs freely Psychiatric: Mood is appropriate for condition and setting Neurologic: No  noted neurological or focal deficits          Labs on Admission:  Basic Metabolic Panel:  Recent Labs Lab 11/10/16 1451 11/10/16 1455  NA 123* 126*  K 6.1* 4.5  CL 84* 79*  CO2  --  20*  GLUCOSE 249* 235*  BUN 54* 39*  CREATININE 1.80* 2.03*  CALCIUM  --  9.5   Liver Function Tests:  Recent Labs Lab 11/10/16 1455  AST 67*  ALT 80*  ALKPHOS 58  BILITOT 2.4*  PROT 7.6  ALBUMIN 4.5    Recent Labs Lab 11/10/16 1455  LIPASE 57*   No  results for input(s): AMMONIA in the last 168 hours. CBC:  Recent Labs Lab 11/10/16 1451 11/10/16 1455  WBC  --  14.4*  NEUTROABS  --  12.8*  HGB 16.7 16.1  HCT 49.0 43.6  MCV  --  90.8  PLT  --  335   Cardiac Enzymes: No results for input(s): CKTOTAL, CKMB, CKMBINDEX, TROPONINI in the last 168 hours.  BNP (last 3 results) No results for input(s): BNP in the last 8760 hours.  ProBNP (last 3 results) No results for input(s): PROBNP in the last 8760 hours.  CBG: No results for input(s): GLUCAP in the last 168 hours.  Radiological Exams on Admission: Dg Chest Port 1 View  Result Date: 11/10/2016 CLINICAL DATA:  Altered mental status and shaking. EXAM: PORTABLE CHEST 1 VIEW COMPARISON:  11/25/2010 radiographs FINDINGS: The cardiomediastinal silhouette is unremarkable. There is no evidence of focal airspace disease, pulmonary edema, suspicious pulmonary nodule/mass, pleural effusion, or pneumothorax. No acute bony abnormalities are identified. IMPRESSION: No active disease. Electronically Signed   By: Harmon PierJeffrey  Hu M.D.   On: 11/10/2016 15:27    EKG: Independently reviewed. Sinus tachycardia.   Assessment/Plan Present on Admission: **None**  Active Problems:   * No active hospital problems. *  Acute alcohol withdrawal, early phase -Alcohol withdrawal protocol with CIWA -Closely monitor symptoms and vital signs -Fall and seizure precaution -Neuro checks  SIRS, most likely reactive from early phase of alcohol withdrawal -wbc 14, hr 123 -Reports nausea, vomiting, diarrhea which could be related to early withdrawal symptoms -procalcitonin -CXR unremarkable, U/A unremarkable for infection, afebrile  Lactic acidosis most likely 2/2 to alcohol withdrawal - CIWA -IV fluid hydration  Moderate hyponatremia -Most likely related to chronic alcohol use vs others -Na+ 126 from 123 on presentation -IV fluid NS -Repeat BMP every 4hr  AKI without CKD -cr 0.7 in sept 2018 -cr  2.03 -IV fluid hydration -Avoid nephrotoxic agents -BMP in the am  High anion gap metabolic acidosis most likely multifactorial 2/2 AKI vs lactic acidosis - Anion gap 27, serum bicarb 20 - lactic acid 15 - cr 2.03 from baseline 0.7.  Starvation ketoacidosis - urine ketones 5 - will provide diet when more stable  Elevated LFTs most likely related to chronic alcohol use - AST 67, ALT 80 - repeat Chemistry panel am  Elevated lipase -lipase 57 -most likely due to chronic alcohol use -no abd pain  Hyperglycemia with no known hx of diabetes - serum BS 235 - Get A1C - Insulin sliding scale sensitive with hypoglycemic protocol  Hyperkalemia -Resolving -K+ 6.1 on presentation -K+ 4.5 now -BMP  Depression/anxiety -Escitalopram, trazodone  Stage II pressure ulcer, present on admission -protective cream -Mepilex  DVT prophylaxis: Heparin 5000 U sq TID  Code Status: Full  Family Communication: With father, all questions answered to his satisfaction.  Disposition Plan: Will admit to stepdown  unit as inpatient status.  Consults called: None   Admission status: Inpatient    Darlin Drop MD [facility] Pager [phone]  If 7PM-7AM, please contact night-coverage [email or web address] Password [facility]  11/10/2016, 4:27 PM

## 2016-11-10 NOTE — ED Notes (Signed)
Per family, pt quit his job 5 months ago and has inceased his alcohol consumption since.

## 2016-11-10 NOTE — ED Provider Notes (Signed)
[facility] EMERGENCY DEPARTMENT Provider Note   CSN: [id number] Arrival date & time: 11/10/16  1405     History   Chief Complaint Chief Complaint  Patient presents with  . withdrawal  . Delirium Tremens (DTS)    HPI Alex DEFINO is a 51 y.o. male.  Patient has been vomiting last 24 hours and has been shaking a lot he said.  Patient has been drinking 2 bottles of wine a day at least for the last 5 months and did not drink any today   The history is provided by the patient.  Illness  This is a recurrent problem. The current episode started 2 days ago. The problem occurs constantly. The problem has not changed since onset.Pertinent negatives include no chest pain, no abdominal pain and no headaches. Nothing aggravates the symptoms. Nothing relieves the symptoms. He has tried nothing for the symptoms. The treatment provided no relief.    Past Medical History:  Diagnosis Date  . Alcohol abuse     Patient Active Problem List   Diagnosis Date Noted  . Alcohol use disorder, severe, dependence (HCC) 09/27/2016  . Alcohol abuse with alcohol-induced mood disorder (HCC) 09/22/2016    History reviewed. No pertinent surgical history.     Home Medications    Prior to Admission medications   Medication Sig Start Date End Date Taking? Authorizing Provider  escitalopram (LEXAPRO) 10 MG tablet Take 1 tablet (10 mg total) by mouth daily. 09/28/16   Oneta Rack, NP  naltrexone (DEPADE) 50 MG tablet Take 1 tablet (50 mg total) by mouth daily. 09/28/16   Oneta Rack, NP  omega-3 acid ethyl esters (LOVAZA) 1 g capsule Take 1 capsule (1 g total) by mouth 2 (two) times daily. 09/27/16   Oneta Rack, NP  traZODone (DESYREL) 50 MG tablet Take 1 tablet (50 mg total) by mouth at bedtime and may repeat dose one time if needed. 09/27/16   Oneta Rack, NP    Family History Family History  Problem Relation Age of Onset  . Mental illness Neg Hx     Social  History Social History  Substance Use Topics  . Smoking status: Never Smoker  . Smokeless tobacco: Never Used  . Alcohol use Yes     Comment: vodka every other day     Allergies   Patient has no known allergies.   Review of Systems Review of Systems  Constitutional: Negative for appetite change and fatigue.  HENT: Negative for congestion, ear discharge and sinus pressure.   Eyes: Negative for discharge.  Respiratory: Negative for cough.   Cardiovascular: Negative for chest pain.  Gastrointestinal: Positive for vomiting. Negative for abdominal pain and diarrhea.  Genitourinary: Negative for frequency and hematuria.  Musculoskeletal: Negative for back pain.  Skin: Negative for rash.  Neurological: Negative for seizures and headaches.       Tremors  Psychiatric/Behavioral: Negative for hallucinations.     Physical Exam Updated Vital Signs BP (!) 172/106   Pulse (!) 123   Temp 98 F (36.7 C) (Oral)   Resp (!) 21   Ht 5\' 10"  (1.778 m)   Wt 68 kg (150 lb)   SpO2 98%   BMI 21.52 kg/m   Physical Exam  Constitutional: He is oriented to person, place, and time. He appears well-developed.  HENT:  Head: Normocephalic.  Eyes: Conjunctivae and EOM are normal. No scleral icterus.  Neck: Neck supple. No thyromegaly present.  Cardiovascular: Normal rate and  regular rhythm.  Exam reveals no gallop and no friction rub.   No murmur heard. Pulmonary/Chest: No stridor. He has no wheezes. He has no rales. He exhibits no tenderness.  Abdominal: He exhibits no distension. There is no tenderness. There is no rebound.  Musculoskeletal: Normal range of motion. He exhibits no edema.  Lymphadenopathy:    He has no cervical adenopathy.  Neurological: He is oriented to person, place, and time. He exhibits normal muscle tone. Coordination normal.  Patient with moderate tremors  Skin: No rash noted. No erythema.  Psychiatric: He has a normal mood and affect. His behavior is normal.      ED Treatments / Results  Labs (all labs ordered are listed, but only abnormal results are displayed) Labs Reviewed  CBC WITH DIFFERENTIAL/PLATELET - Abnormal; Notable for the following:       Result Value   WBC 14.4 (*)    MCHC 36.9 (*)    Neutro Abs 12.8 (*)    All other components within normal limits  COMPREHENSIVE METABOLIC PANEL - Abnormal; Notable for the following:    Sodium 126 (*)    Chloride 79 (*)    CO2 20 (*)    Glucose, Bld 235 (*)    BUN 39 (*)    Creatinine, Ser 2.03 (*)    AST 67 (*)    ALT 80 (*)    Total Bilirubin 2.4 (*)    GFR calc non Af Amer 37 (*)    GFR calc Af Amer 42 (*)    Anion gap 27 (*)    All other components within normal limits  URINALYSIS, ROUTINE W REFLEX MICROSCOPIC - Abnormal; Notable for the following:    Color, Urine AMBER (*)    APPearance HAZY (*)    Hgb urine dipstick SMALL (*)    Ketones, ur 5 (*)    Protein, ur 100 (*)    All other components within normal limits  LIPASE, BLOOD - Abnormal; Notable for the following:    Lipase 57 (*)    All other components within normal limits  I-STAT CHEM 8, ED - Abnormal; Notable for the following:    Sodium 123 (*)    Potassium 6.1 (*)    Chloride 84 (*)    BUN 54 (*)    Creatinine, Ser 1.80 (*)    Glucose, Bld 249 (*)    Calcium, Ion 0.91 (*)    All other components within normal limits  I-STAT CG4 LACTIC ACID, ED - Abnormal; Notable for the following:    Lactic Acid, Venous 15.00 (*)    All other components within normal limits  CULTURE, BLOOD (ROUTINE X 2)  CULTURE, BLOOD (ROUTINE X 2)  ETHANOL  RAPID URINE DRUG SCREEN, HOSP PERFORMED  I-STAT CG4 LACTIC ACID, ED  I-STAT CG4 LACTIC ACID, ED  I-STAT CG4 LACTIC ACID, ED    EKG  EKG Interpretation None       Radiology Dg Chest Port 1 View  Result Date: 11/10/2016 CLINICAL DATA:  Altered mental status and shaking. EXAM: PORTABLE CHEST 1 VIEW COMPARISON:  11/25/2010 radiographs FINDINGS: The cardiomediastinal  silhouette is unremarkable. There is no evidence of focal airspace disease, pulmonary edema, suspicious pulmonary nodule/mass, pleural effusion, or pneumothorax. No acute bony abnormalities are identified. IMPRESSION: No active disease. Electronically Signed   By: Harmon PierJeffrey  Hu M.D.   On: 11/10/2016 15:27    Procedures Procedures (including critical care time)  Medications Ordered in ED Medications  sodium chloride 0.9 %  bolus 2,000 mL (0 mLs Intravenous Stopped 11/10/16 1624)  pantoprazole (PROTONIX) injection 40 mg (40 mg Intravenous Given 11/10/16 1445)  LORazepam (ATIVAN) injection 1 mg (1 mg Intravenous Given 11/10/16 1445)  sodium chloride 0.9 % bolus 1,000 mL (1,000 mLs Intravenous New Bag/Given 11/10/16 1601)  pantoprazole (PROTONIX) injection 40 mg (40 mg Intravenous Given 11/10/16 1552)  LORazepam (ATIVAN) injection 1 mg (1 mg Intravenous Given 11/10/16 1601)     Initial Impression / Assessment and Plan / ED Course  I have reviewed the triage vital signs and the nursing notes. CRITICAL CARE Performed by: Eriel Doyon L Total critical care time: Critical care time was exclusive of separately billable procedures and treating other patients. Critical care was necessary to treat or prevent imminent or life-threatening deterioration. Critical care was time spent personally by me on the following activities: development of treatment plan with patient and/or surrogate as well as nursing, discussions with consultants, evaluation of patient's response to treatment, examination of patient, obtaining history from patient or surrogate, ordering and performing treatments and interventions, ordering and review of laboratory studies, ordering and review of radiographic studies, pulse oximetry and re-evaluation of patient's condition.  Pertinent labs & imaging results that were available during my care of the patient were reviewed by me and considered in my medical decision making (see chart  for details). Patient with vomiting and dehydration and alcohol withdrawal.  His lactic acid was 15 initially.  I spoke with critical care and they felt like the patient did not need antibiotics right now and could be admitted to stepdown by medicine with critical care consult if necessary      Final Clinical Impressions(s) / ED Diagnoses   Final diagnoses:  None    New Prescriptions New Prescriptions   No medications on file     Bethann Berkshire, MD 11/11/16 1214

## 2016-11-10 NOTE — ED Notes (Signed)
Seizure pads applied to stretcher

## 2016-11-10 NOTE — ED Triage Notes (Signed)
Pt here with DT's, shaking, slightly confused. Pt last drank 2 bottles of wine yesterday and has had nothing since. Pt had bowel movement onself. Confused to the day. Following all commands.

## 2016-11-10 NOTE — ED Notes (Signed)
No antibiotics to be given per md. Pt will continue to be treated for detox.

## 2016-11-11 DIAGNOSIS — E872 Acidosis: Secondary | ICD-10-CM

## 2016-11-11 DIAGNOSIS — E871 Hypo-osmolality and hyponatremia: Secondary | ICD-10-CM

## 2016-11-11 DIAGNOSIS — R6510 Systemic inflammatory response syndrome (SIRS) of non-infectious origin without acute organ dysfunction: Secondary | ICD-10-CM

## 2016-11-11 LAB — BASIC METABOLIC PANEL
ANION GAP: 10 (ref 5–15)
ANION GAP: 9 (ref 5–15)
ANION GAP: 10 (ref 5–15)
ANION GAP: 6 (ref 5–15)
ANION GAP: 9 (ref 5–15)
ANION GAP: 9 (ref 5–15)
BUN: 24 mg/dL — ABNORMAL HIGH (ref 6–20)
BUN: 21 mg/dL — AB (ref 6–20)
BUN: 19 mg/dL (ref 6–20)
BUN: 17 mg/dL (ref 6–20)
BUN: 16 mg/dL (ref 6–20)
BUN: 14 mg/dL (ref 6–20)
CALCIUM: 8.4 mg/dL — AB (ref 8.9–10.3)
CALCIUM: 8.4 mg/dL — AB (ref 8.9–10.3)
CO2: 24 mmol/L (ref 22–32)
CO2: 24 mmol/L (ref 22–32)
CO2: 24 mmol/L (ref 22–32)
CO2: 24 mmol/L (ref 22–32)
CO2: 24 mmol/L (ref 22–32)
CO2: 24 mmol/L (ref 22–32)
CREATININE: 0.93 mg/dL (ref 0.61–1.24)
CREATININE: 0.91 mg/dL (ref 0.61–1.24)
Calcium: 8.4 mg/dL — ABNORMAL LOW (ref 8.9–10.3)
Calcium: 8.2 mg/dL — ABNORMAL LOW (ref 8.9–10.3)
Calcium: 8.7 mg/dL — ABNORMAL LOW (ref 8.9–10.3)
Calcium: 8.3 mg/dL — ABNORMAL LOW (ref 8.9–10.3)
Chloride: 96 mmol/L — ABNORMAL LOW (ref 101–111)
Chloride: 94 mmol/L — ABNORMAL LOW (ref 101–111)
Chloride: 95 mmol/L — ABNORMAL LOW (ref 101–111)
Chloride: 95 mmol/L — ABNORMAL LOW (ref 101–111)
Chloride: 96 mmol/L — ABNORMAL LOW (ref 101–111)
Chloride: 93 mmol/L — ABNORMAL LOW (ref 101–111)
Creatinine, Ser: 0.88 mg/dL (ref 0.61–1.24)
Creatinine, Ser: 0.85 mg/dL (ref 0.61–1.24)
Creatinine, Ser: 1.09 mg/dL (ref 0.61–1.24)
Creatinine, Ser: 0.95 mg/dL (ref 0.61–1.24)
GFR calc Af Amer: >60 mL/min (ref >60)
GFR calc Af Amer: >60 mL/min (ref >60)
GFR calc Af Amer: >60 mL/min (ref >60)
GFR calc non Af Amer: >60 mL/min (ref >60)
GLUCOSE: 118 mg/dL — AB (ref 65–99)
GLUCOSE: 107 mg/dL — AB (ref 65–99)
GLUCOSE: 137 mg/dL — AB (ref 65–99)
GLUCOSE: 136 mg/dL — AB (ref 65–99)
GLUCOSE: 133 mg/dL — AB (ref 65–99)
GLUCOSE: 110 mg/dL — AB (ref 65–99)
Potassium: 3.6 mmol/L (ref 3.5–5.1)
Potassium: 3.6 mmol/L (ref 3.5–5.1)
Potassium: 3.4 mmol/L — ABNORMAL LOW (ref 3.5–5.1)
Potassium: 3.5 mmol/L (ref 3.5–5.1)
Potassium: 3.4 mmol/L — ABNORMAL LOW (ref 3.5–5.1)
Potassium: 3.6 mmol/L (ref 3.5–5.1)
Sodium: 130 mmol/L — ABNORMAL LOW (ref 135–145)
Sodium: 127 mmol/L — ABNORMAL LOW (ref 135–145)
Sodium: 129 mmol/L — ABNORMAL LOW (ref 135–145)
Sodium: 125 mmol/L — ABNORMAL LOW (ref 135–145)
Sodium: 129 mmol/L — ABNORMAL LOW (ref 135–145)
Sodium: 126 mmol/L — ABNORMAL LOW (ref 135–145)

## 2016-11-11 LAB — CBC
HEMATOCRIT: 35.6 % — AB (ref 39.0–52.0)
Hemoglobin: 12.1 g/dL — ABNORMAL LOW (ref 13.0–17.0)
MCH: 32.1 pg (ref 26.0–34.0)
MCHC: 34 g/dL (ref 30.0–36.0)
MCV: 94.4 fL (ref 78.0–100.0)
PLATELETS: 209 10*3/uL (ref 150–400)
RBC: 3.77 MIL/uL — AB (ref 4.22–5.81)
RDW: 14.3 % (ref 11.5–15.5)
WBC: 6.9 10*3/uL (ref 4.0–10.5)

## 2016-11-11 LAB — MRSA PCR SCREENING: MRSA BY PCR: NEGATIVE

## 2016-11-11 LAB — PROCALCITONIN: PROCALCITONIN: 0.64 ng/mL

## 2016-11-11 MED ORDER — IBUPROFEN 200 MG PO TABS
400.0000 mg | ORAL_TABLET | Freq: Four times a day (QID) | ORAL | Status: DC | PRN
  Administered 2016-11-11 – 2016-11-13 (×2): 400 mg via ORAL
  Filled 2016-11-11 (×2): qty 2

## 2016-11-11 MED ORDER — PHENOL 1.4 % MT LIQD
1.0000 | OROMUCOSAL | Status: DC | PRN
  Administered 2016-11-11: 1 via OROMUCOSAL
  Filled 2016-11-11: qty 177

## 2016-11-11 MED ORDER — LABETALOL HCL 5 MG/ML IV SOLN
5.0000 mg | Freq: Two times a day (BID) | INTRAVENOUS | Status: DC | PRN
  Administered 2016-11-11: 5 mg via INTRAVENOUS
  Filled 2016-11-11: qty 4

## 2016-11-11 MED ORDER — METOCLOPRAMIDE HCL 5 MG/ML IJ SOLN
5.0000 mg | Freq: Once | INTRAMUSCULAR | Status: AC
  Administered 2016-11-11: 5 mg via INTRAVENOUS
  Filled 2016-11-11: qty 2

## 2016-11-11 NOTE — Progress Notes (Signed)
Pt requested Advil for sore throat.  Verbal order rec'd from Dr C. Hall for Advil

## 2016-11-11 NOTE — Progress Notes (Addendum)
PROGRESS NOTE  Alex Logan ZOX:[id number] DOB: 1965/05/10 DOA: 11/10/2016 PCP: System, Pcp Not In  HPI/Recap of past 24 hours: Denies headaches. Hypertensive; prn labetalol. Denies vomiting or diarrhea. Tolerating oral intake. Denies abdominal pain.  Assessment/Plan: Active Problems:   Alcohol withdrawal (HCC)   SIRS (systemic inflammatory response syndrome) (HCC)   High anion gap metabolic acidosis   Lactic acidosis   Hyponatremia   Starvation ketoacidosis   Code Status: Full  Family Communication: No family at bedside  Disposition Plan: Will stay another midnight to continue current management   Consultants:  None  Procedures:  None  Antimicrobials:  None  DVT prophylaxis:  hep 5000 u TID sq   Objective: Vitals:   11/10/16 2000 11/10/16 2100 11/11/16 0000 11/11/16 0400  BP:  (!) 152/99 (!) 137/91 (!) 165/104  Pulse:  (!) 113 (!) 105 (!) 107  Resp: (!) 23   (!) 21  Temp: 99.1 F (37.3 C)  98.9 F (37.2 C)   TempSrc: Oral  Oral   SpO2:    98%  Weight: 61.2 kg (134 lb 14.7 oz)     Height: 5\' 10"  (1.778 m)       Intake/Output Summary (Last 24 hours) at 11/11/16 0714 Last data filed at 11/11/16 0356  Gross per 24 hour  Intake                0 ml  Output              600 ml  Net             -600 ml   Filed Weights   11/10/16 1422 11/10/16 2000  Weight: 68 kg (150 lb) 61.2 kg (134 lb 14.7 oz)    Exam:   General:  51 year old caucasian male, thin built, laying in bed. + hiccups  Eyes: Pupils round and reactive to light. Icteric sclerae   ENT: Dry mucous membranes, no erythema or exudates  Neck: No JVD or thyromegaly  Cardiovascular: RRR with no murmurs rubs or gallops   Respiratory: CTA with no wheezes or rhonchi  Abdomen: Soft NT ND with NBS x 4 quadrants  Skin: No noted open lesions   Musculoskeletal: Moves all limbs freely  Psychiatric: Mood is appropriate for condition and setting  Neurologic: No noted neurological or  focal deficits   Data Reviewed: CBC:  Recent Labs Lab 11/10/16 1451 11/10/16 1455  WBC  --  14.4*  NEUTROABS  --  12.8*  HGB 16.7 16.1  HCT 49.0 43.6  MCV  --  90.8  PLT  --  335   Basic Metabolic Panel:  Recent Labs Lab 11/10/16 1451 11/10/16 1455 11/10/16 1727 11/10/16 2213 11/11/16 0301  NA 123* 126* 127* 129* 130*  K 6.1* 4.5 4.9 4.1 3.6  CL 84* 79* 89* 95* 96*  CO2  --  20* 23 23 24   GLUCOSE 249* 235* 98 138* 118*  BUN 54* 39* 37* 27* 24*  CREATININE 1.80* 2.03* 1.44* 1.15 0.93  CALCIUM  --  9.5 8.4* 8.3* 8.4*   GFR: Estimated Creatinine Clearance: 82.3 mL/min (by C-G formula based on SCr of 0.93 mg/dL). Liver Function Tests:  Recent Labs Lab 11/10/16 1455  AST 67*  ALT 80*  ALKPHOS 58  BILITOT 2.4*  PROT 7.6  ALBUMIN 4.5    Recent Labs Lab 11/10/16 1455  LIPASE 57*   No results for input(s): AMMONIA in the last 168 hours. Coagulation Profile: No results for input(s):  INR, PROTIME in the last 168 hours. Cardiac Enzymes: No results for input(s): CKTOTAL, CKMB, CKMBINDEX, TROPONINI in the last 168 hours. BNP (last 3 results) No results for input(s): PROBNP in the last 8760 hours. HbA1C:  Recent Labs  11/10/16 1720  HGBA1C 5.3   CBG: No results for input(s): GLUCAP in the last 168 hours. Lipid Profile: No results for input(s): CHOL, HDL, LDLCALC, TRIG, CHOLHDL, LDLDIRECT in the last 72 hours. Thyroid Function Tests: No results for input(s): TSH, T4TOTAL, FREET4, T3FREE, THYROIDAB in the last 72 hours. Anemia Panel: No results for input(s): VITAMINB12, FOLATE, FERRITIN, TIBC, IRON, RETICCTPCT in the last 72 hours. Urine analysis:    Component Value Date/Time   COLORURINE AMBER (A) 11/10/2016 1550   APPEARANCEUR HAZY (A) 11/10/2016 1550   LABSPEC 1.027 11/10/2016 1550   PHURINE 5.0 11/10/2016 1550   GLUCOSEU NEGATIVE 11/10/2016 1550   HGBUR SMALL (A) 11/10/2016 1550   BILIRUBINUR NEGATIVE 11/10/2016 1550   KETONESUR 5 (A)  11/10/2016 1550   PROTEINUR 100 (A) 11/10/2016 1550   UROBILINOGEN 0.2 12/06/2012 1725   NITRITE NEGATIVE 11/10/2016 1550   LEUKOCYTESUR NEGATIVE 11/10/2016 1550   Sepsis Labs: @LABRCNTIP (procalcitonin:4,lacticidven:4)  ) Recent Results (from the past 240 hour(s))  MRSA PCR Screening     Status: None   Collection Time: 11/10/16  8:30 PM  Result Value Ref Range Status   MRSA by PCR NEGATIVE NEGATIVE Final    Comment:        The GeneXpert MRSA Assay (FDA approved for NASAL specimens only), is one component of a comprehensive MRSA colonization surveillance program. It is not intended to diagnose MRSA infection nor to guide or monitor treatment for MRSA infections.       Studies: Dg Chest Port 1 View  Result Date: 11/10/2016 CLINICAL DATA:  Altered mental status and shaking. EXAM: PORTABLE CHEST 1 VIEW COMPARISON:  11/25/2010 radiographs FINDINGS: The cardiomediastinal silhouette is unremarkable. There is no evidence of focal airspace disease, pulmonary edema, suspicious pulmonary nodule/mass, pleural effusion, or pneumothorax. No acute bony abnormalities are identified. IMPRESSION: No active disease. Electronically Signed   By: Harmon Pier M.D.   On: 11/10/2016 15:27    Scheduled Meds: . heparin subcutaneous  5,000 Units Subcutaneous Q8H    Continuous Infusions:   LOS: 1 day   Assessment/Plan  Acute alcohol withdrawal -Alcohol withdrawal protocol with CIWA -Closely monitor symptoms and vital signs -Fall and seizure precaution -Neuro checks  Hypertensive urgency most likely 2/2 to alcohol withdrawal -IV labetalol prn sbp>180 or dbp>105 -No hx of hTN  Hiccups -IV reglan once  SIRS, most likely reactive from early phase of alcohol withdrawal -resolved  Lactic acidosis most likely 2/2 to alcohol withdrawal - CIWA -IV fluid hydration -repeat lactic acid  Moderate hyponatremia -Most likely related to chronic alcohol use vs others -Na+ 125 from 123 on  presentation -IV fluid NS -Repeat BMP every 4hr  AKI without CKD -resolving -cr 0.7 in sept 2018 -cr 2.03 on presentation -IV fluid hydration -Avoid nephrotoxic agents -BMP in the am  High anion gap metabolic acidosis most likely multifactorial 2/2 AKI vs lactic acidosis -resolved - Anion gap 27, serum bicarb 20, lactic acid 15 (11/10/16)  Starvation ketoacidosis - resolving  Elevated LFTs most likely related to chronic alcohol use - AST 67, ALT 80 admission - repeat Chemistry panel am  Elevated lipase -lipase 57 admission -most likely due to chronic alcohol use -no abd pain  Hyperglycemia with no known hx of diabetes - Get A1C 5.3;  BS 136 from 235  On admission - Insulin sliding scale sensitive with hypoglycemic protocol  Hyperkalemia -Resolved -BMP  Depression/anxiety -Escitalopram, trazodone  DVT prophylaxis: Heparin 5000 U sq TID   Darlin Droparole N Graysyn Bache, MD [facility] Pager [phone]  If 7PM-7AM, please contact night-coverage [email or web address] Password [email or web address] 11/11/2016, 7:14 AM

## 2016-11-12 ENCOUNTER — Other Ambulatory Visit: Payer: Self-pay

## 2016-11-12 DIAGNOSIS — L8990 Pressure ulcer of unspecified site, unspecified stage: Secondary | ICD-10-CM

## 2016-11-12 LAB — COMPREHENSIVE METABOLIC PANEL
ALK PHOS: 45 U/L (ref 38–126)
ALT: 54 U/L (ref 17–63)
ANION GAP: 10 (ref 5–15)
AST: 53 U/L — ABNORMAL HIGH (ref 15–41)
Albumin: 3.4 g/dL — ABNORMAL LOW (ref 3.5–5.0)
BILIRUBIN TOTAL: 1 mg/dL (ref 0.3–1.2)
BUN: 12 mg/dL (ref 6–20)
CALCIUM: 8.5 mg/dL — AB (ref 8.9–10.3)
CO2: 25 mmol/L (ref 22–32)
Chloride: 96 mmol/L — ABNORMAL LOW (ref 101–111)
Creatinine, Ser: 0.69 mg/dL (ref 0.61–1.24)
GFR calc Af Amer: >60 mL/min (ref >60)
GFR calc non Af Amer: >60 mL/min (ref >60)
Glucose, Bld: 95 mg/dL (ref 65–99)
Potassium: 3.5 mmol/L (ref 3.5–5.1)
Sodium: 131 mmol/L — ABNORMAL LOW (ref 135–145)
TOTAL PROTEIN: 5.6 g/dL — AB (ref 6.5–8.1)

## 2016-11-12 LAB — BASIC METABOLIC PANEL
ANION GAP: 8 (ref 5–15)
ANION GAP: 7 (ref 5–15)
Anion gap: 9 (ref 5–15)
BUN: 13 mg/dL (ref 6–20)
BUN: 11 mg/dL (ref 6–20)
BUN: 13 mg/dL (ref 6–20)
CALCIUM: 8.5 mg/dL — AB (ref 8.9–10.3)
CALCIUM: 8.6 mg/dL — AB (ref 8.9–10.3)
CALCIUM: 8.8 mg/dL — AB (ref 8.9–10.3)
CO2: 25 mmol/L (ref 22–32)
CO2: 25 mmol/L (ref 22–32)
CO2: 25 mmol/L (ref 22–32)
CREATININE: 0.75 mg/dL (ref 0.61–1.24)
Chloride: 96 mmol/L — ABNORMAL LOW (ref 101–111)
Chloride: 98 mmol/L — ABNORMAL LOW (ref 101–111)
Chloride: 99 mmol/L — ABNORMAL LOW (ref 101–111)
Creatinine, Ser: 0.69 mg/dL (ref 0.61–1.24)
Creatinine, Ser: 0.86 mg/dL (ref 0.61–1.24)
GFR calc Af Amer: >60 mL/min (ref >60)
GFR calc non Af Amer: >60 mL/min (ref >60)
GLUCOSE: 101 mg/dL — AB (ref 65–99)
GLUCOSE: 102 mg/dL — AB (ref 65–99)
GLUCOSE: 170 mg/dL — AB (ref 65–99)
Potassium: 3.9 mmol/L (ref 3.5–5.1)
Potassium: 3.7 mmol/L (ref 3.5–5.1)
Potassium: 3.5 mmol/L (ref 3.5–5.1)
Sodium: 130 mmol/L — ABNORMAL LOW (ref 135–145)
Sodium: 131 mmol/L — ABNORMAL LOW (ref 135–145)
Sodium: 131 mmol/L — ABNORMAL LOW (ref 135–145)

## 2016-11-12 LAB — CBC
HEMATOCRIT: 36.1 % — AB (ref 39.0–52.0)
HEMOGLOBIN: 12.3 g/dL — AB (ref 13.0–17.0)
MCH: 32.4 pg (ref 26.0–34.0)
MCHC: 34.1 g/dL (ref 30.0–36.0)
MCV: 95 fL (ref 78.0–100.0)
Platelets: 159 10*3/uL (ref 150–400)
RBC: 3.8 MIL/uL — ABNORMAL LOW (ref 4.22–5.81)
RDW: 13.6 % (ref 11.5–15.5)
WBC: 4.7 10*3/uL (ref 4.0–10.5)

## 2016-11-12 LAB — LACTIC ACID, PLASMA: Lactic Acid, Venous: 1.2 mmol/L (ref 0.5–1.9)

## 2016-11-12 LAB — PROCALCITONIN: Procalcitonin: 0.26 ng/mL

## 2016-11-12 MED ORDER — ESCITALOPRAM OXALATE 10 MG PO TABS
10.0000 mg | ORAL_TABLET | Freq: Every day | ORAL | Status: DC
  Administered 2016-11-12 – 2016-11-15 (×4): 10 mg via ORAL
  Filled 2016-11-12 (×4): qty 1

## 2016-11-12 MED ORDER — TRAZODONE HCL 50 MG PO TABS
50.0000 mg | ORAL_TABLET | Freq: Every evening | ORAL | Status: DC | PRN
  Administered 2016-11-12 – 2016-11-14 (×4): 50 mg via ORAL
  Filled 2016-11-12 (×4): qty 1

## 2016-11-12 MED ORDER — LABETALOL HCL 5 MG/ML IV SOLN
5.0000 mg | Freq: Four times a day (QID) | INTRAVENOUS | Status: DC | PRN
  Administered 2016-11-12 – 2016-11-14 (×4): 5 mg via INTRAVENOUS
  Filled 2016-11-12 (×5): qty 4

## 2016-11-12 NOTE — Plan of Care (Signed)
Patient did well through the shift, able to voice personal needs and promoted self care. Patient able to follow commands but needed reinforcement for safety precautions.

## 2016-11-12 NOTE — Plan of Care (Signed)
Patient instructed to call before trying to get out of bed. Bed alarm is on and call bell within reach.

## 2016-11-12 NOTE — Plan of Care (Signed)
Patient is progressing.  

## 2016-11-12 NOTE — Progress Notes (Signed)
PROGRESS NOTE  Alex Logan ZOX:[id number] DOB: [birth year] DOA: 11/10/2016 PCP: System, Pcp Not In  HPI/Recap of past 24 hours: Irritant contact dermatitis noted in the perineal region which was present on admission. States he has noticed it for two days. Had fecal matter on his clothes with diarrhea on admission. Denies abd pain or nausea stating he is hungry and would like to eat more. Denies diarrhea. Denies headache, dizziness, cheat pain, or dyspnea.  Assessment/Plan: Active Problems:   Alcohol withdrawal (HCC)   SIRS (systemic inflammatory response syndrome) (HCC)   High anion gap metabolic acidosis   Lactic acidosis   Hyponatremia   Starvation ketoacidosis  Code Status: Full  Family Communication: No family at bedside  Disposition Plan: Will stay another midnight to continue current management   Consultants:  None  Procedures:  None  Antimicrobials:  None   DVT prophylaxis:  hep 5000 u TID sq    Objective: Vitals:   11/12/16 0100 11/12/16 0300 11/12/16 0400 11/12/16 0411  BP: 127/90 (!) 147/99 (!) 121/96   Pulse: 93 91 86   Resp: 17 19 (!) 23   Temp:    98.2 F (36.8 C)  TempSrc:    Oral  SpO2: 98% 99% 99%   Weight:      Height:        Intake/Output Summary (Last 24 hours) at 11/12/2016 0659 Last data filed at 11/12/2016 0400 Gross per 24 hour  Intake 240 ml  Output 2100 ml  Net -1860 ml   Filed Weights   11/10/16 1422 11/10/16 2000  Weight: 68 kg (150 lb) 61.2 kg (134 lb 14.7 oz)    Exam:   General:51 year old caucasian male, thin built, laying in bed in NAD.  Eyes:Pupils round and reactive to light. Icteric sclerae  ENT:Dry mucous membranes, no erythema or exudates  Neck:No JVD or thyromegaly  Cardiovascular:RRR with no murmurs rubs or gallops  Respiratory:CTA with no wheezes or rhonchi  Abdomen:Soft NT ND with NBS x 4 quadrants  Skin:No noted open lesions  Musculoskeletal:Moves all limbs  freely  Psychiatric:Mood is appropriate for condition and setting  Neurologic:No noted neurological or focal deficits     Data Reviewed: CBC: Recent Labs  Lab 11/10/16 1451 11/10/16 1455 11/11/16 0633 11/12/16 0601  WBC  --  14.4* 6.9 4.7  NEUTROABS  --  12.8*  --   --   HGB 16.7 16.1 12.1* 12.3*  HCT 49.0 43.6 35.6* 36.1*  MCV  --  90.8 94.4 95.0  PLT  --  335 209 159   Basic Metabolic Panel: Recent Labs  Lab 11/11/16 0927 11/11/16 1505 11/11/16 1903 11/11/16 2228 11/12/16 0255  NA 129* 125* 129* 126* 130*  K 3.4* 3.5 3.4* 3.6 3.9  CL 95* 95* 96* 93* 96*  CO2 24 24 24 24 25   GLUCOSE 137* 136* 133* 110* 101*  BUN 19 17 16 14 13   CREATININE 0.85 1.09 0.95 0.91 0.75  CALCIUM 8.4* 8.2* 8.7* 8.3* 8.5*   GFR: Estimated Creatinine Clearance: 95.6 mL/min (by C-G formula based on SCr of 0.75 mg/dL). Liver Function Tests: Recent Labs  Lab 11/10/16 1455  AST 67*  ALT 80*  ALKPHOS 58  BILITOT 2.4*  PROT 7.6  ALBUMIN 4.5   Recent Labs  Lab 11/10/16 1455  LIPASE 57*   No results for input(s): AMMONIA in the last 168 hours. Coagulation Profile: No results for input(s): INR, PROTIME in the last 168 hours. Cardiac Enzymes: No results for input(s):  CKTOTAL, CKMB, CKMBINDEX, TROPONINI in the last 168 hours. BNP (last 3 results) No results for input(s): PROBNP in the last 8760 hours. HbA1C: Recent Labs    11/10/16 1720  HGBA1C 5.3   CBG: No results for input(s): GLUCAP in the last 168 hours. Lipid Profile: No results for input(s): CHOL, HDL, LDLCALC, TRIG, CHOLHDL, LDLDIRECT in the last 72 hours. Thyroid Function Tests: No results for input(s): TSH, T4TOTAL, FREET4, T3FREE, THYROIDAB in the last 72 hours. Anemia Panel: No results for input(s): VITAMINB12, FOLATE, FERRITIN, TIBC, IRON, RETICCTPCT in the last 72 hours. Urine analysis:    Component Value Date/Time   COLORURINE AMBER (A) 11/10/2016 1550   APPEARANCEUR HAZY (A) 11/10/2016 1550   LABSPEC  1.027 11/10/2016 1550   PHURINE 5.0 11/10/2016 1550   GLUCOSEU NEGATIVE 11/10/2016 1550   HGBUR SMALL (A) 11/10/2016 1550   BILIRUBINUR NEGATIVE 11/10/2016 1550   KETONESUR 5 (A) 11/10/2016 1550   PROTEINUR 100 (A) 11/10/2016 1550   UROBILINOGEN 0.2 12/06/2012 1725   NITRITE NEGATIVE 11/10/2016 1550   LEUKOCYTESUR NEGATIVE 11/10/2016 1550   Sepsis Labs: @LABRCNTIP (procalcitonin:4,lacticidven:4)  ) Recent Results (from the past 240 hour(s))  Blood Culture (routine x 2)     Status: None (Preliminary result)   Collection Time: 11/10/16  3:10 PM  Result Value Ref Range Status   Specimen Description BLOOD LEFT FOREARM  Final   Special Requests   Final    BOTTLES DRAWN AEROBIC AND ANAEROBIC Blood Culture adequate volume   Culture NO GROWTH < 24 HOURS  Final   Report Status PENDING  Incomplete  Blood Culture (routine x 2)     Status: None (Preliminary result)   Collection Time: 11/10/16  3:14 PM  Result Value Ref Range Status   Specimen Description BLOOD RIGHT ANTECUBITAL  Final   Special Requests   Final    BOTTLES DRAWN AEROBIC AND ANAEROBIC Blood Culture results may not be optimal due to an excessive volume of blood received in culture bottles   Culture NO GROWTH < 24 HOURS  Final   Report Status PENDING  Incomplete  MRSA PCR Screening     Status: None   Collection Time: 11/10/16  8:30 PM  Result Value Ref Range Status   MRSA by PCR NEGATIVE NEGATIVE Final    Comment:        The GeneXpert MRSA Assay (FDA approved for NASAL specimens only), is one component of a comprehensive MRSA colonization surveillance program. It is not intended to diagnose MRSA infection nor to guide or monitor treatment for MRSA infections.       Studies: No results found.  Scheduled Meds: . heparin subcutaneous  5,000 Units Subcutaneous Q8H    Continuous Infusions:   LOS: 2 days   Assessment/Plan  Acute alcohol withdrawal -Alcohol withdrawal protocol with CIWA -Closely monitor  symptoms and vital signs -Fall and seizure precaution -Neuro checks  Hypertensive urgency most likely 2/2 to alcohol withdrawal -Resolving -IV labetalol prn sbp>180 or dbp>105 -No prior hx of hTN  Hiccups -resolved -IV reglan once yesterday 11/11/16  SIRS, most likely reactive from early phase of alcohol withdrawal -resolved  Lactic acidosis most likely 2/2 to alcohol withdrawal -resolved -IV fluid hydration  Moderate hyponatremia -Most likely related to chronic alcohol use vs others -Na+ 131 from 123 on presentation 11/10/16 -IV fluid NS -Repeat BMP in am  AKI withoutCKD -resolved -cr 0.7 in sept 2018 -cr 2.03 on presentation -IV fluid hydration -Avoid nephrotoxic agents -BMP in the am  High anion  gap metabolic acidosis most likely multifactorial 2/2 AKI vs lactic acidosis -resolved - Anion gap 27, serum bicarb 20, lactic acid 15 (11/10/16)  Starvation ketoacidosis - resolving  Elevated LFTs most likely related to chronic alcohol use - 53 from AST 67, 54 from ALT 80 admission - repeat Chemistry panelam  Elevated lipase -lipase 57 admission -most likely due to chronic alcohol use -no abd pain  Hyperglycemia with no known hx of diabetes - Get A1C 5.3; BS 136 from 235  On admission - Insulin sliding scale sensitive with hypoglycemic protocol  Hyperkalemia -Resolved -BMP  Depression/anxiety -Escitalopram, trazodone     Alex Droparole N Kandyce Dieguez, MD [facility] Pager [phone]  If 7PM-7AM, please contact night-coverage [email or web address] Password [email or web address] 11/12/2016, 6:59 AM

## 2016-11-12 NOTE — Progress Notes (Signed)
Patient gave ex-wife permission to receive medical information regarding the patient care.

## 2016-11-13 DIAGNOSIS — R1110 Vomiting, unspecified: Secondary | ICD-10-CM

## 2016-11-13 DIAGNOSIS — L2489 Irritant contact dermatitis due to other agents: Secondary | ICD-10-CM

## 2016-11-13 LAB — BASIC METABOLIC PANEL
Anion gap: 9 (ref 5–15)
BUN: 16 mg/dL (ref 6–20)
CALCIUM: 8.8 mg/dL — AB (ref 8.9–10.3)
CHLORIDE: 98 mmol/L — AB (ref 101–111)
CO2: 25 mmol/L (ref 22–32)
Creatinine, Ser: 0.84 mg/dL (ref 0.61–1.24)
GFR calc Af Amer: >60 mL/min (ref >60)
GLUCOSE: 120 mg/dL — AB (ref 65–99)
POTASSIUM: 3.8 mmol/L (ref 3.5–5.1)
Sodium: 132 mmol/L — ABNORMAL LOW (ref 135–145)

## 2016-11-13 LAB — GLUCOSE, CAPILLARY: Glucose-Capillary: 124 mg/dL — ABNORMAL HIGH (ref 65–99)

## 2016-11-13 LAB — CBC
HCT: 35.4 % — ABNORMAL LOW (ref 39.0–52.0)
HEMOGLOBIN: 12 g/dL — AB (ref 13.0–17.0)
MCH: 32.5 pg (ref 26.0–34.0)
MCHC: 33.9 g/dL (ref 30.0–36.0)
MCV: 95.9 fL (ref 78.0–100.0)
Platelets: 166 10*3/uL (ref 150–400)
RBC: 3.69 MIL/uL — AB (ref 4.22–5.81)
RDW: 13.7 % (ref 11.5–15.5)
WBC: 4.6 10*3/uL (ref 4.0–10.5)

## 2016-11-13 MED ORDER — CHLORDIAZEPOXIDE HCL 5 MG PO CAPS
10.0000 mg | ORAL_CAPSULE | Freq: Three times a day (TID) | ORAL | Status: DC
  Administered 2016-11-13 – 2016-11-15 (×5): 10 mg via ORAL
  Filled 2016-11-13 (×5): qty 2

## 2016-11-13 MED ORDER — ZINC OXIDE 40 % EX OINT
TOPICAL_OINTMENT | Freq: Two times a day (BID) | CUTANEOUS | Status: DC
  Administered 2016-11-13: via TOPICAL
  Administered 2016-11-14: 1 via TOPICAL
  Administered 2016-11-14 – 2016-11-15 (×2): via TOPICAL
  Filled 2016-11-13: qty 114

## 2016-11-13 MED ORDER — HALOPERIDOL LACTATE 5 MG/ML IJ SOLN
1.0000 mg | Freq: Once | INTRAMUSCULAR | Status: AC
  Administered 2016-11-13: 1 mg via INTRAMUSCULAR
  Filled 2016-11-13: qty 1

## 2016-11-13 NOTE — Progress Notes (Signed)
CSW consulted for assistance with finding pt rehab program.  Pt interested in rehab program but does not have insurance- provided with list of local rehab options.  Pt dad at beside and states [facility]- CSW unfamiliar with this program but pt dad states it is a residential treatment program.  [facility] and explained what pt would need to bring- put appt information in AVS.  CSW signing off  [name] H. Ceejay Kegley, LCSW Clinical Social Worker [phone]

## 2016-11-13 NOTE — Plan of Care (Signed)
  Acute Rehab PT Goals(only PT should resolve) Pt Will Go Supine/Side To Sit 11/13/2016 1101 by Tawni MillersWhite, Mahlik Lenn F, PT Flowsheets Taken 11/13/2016 1101  Pt will go Supine/Side to Sit Independently   Acute Rehab PT Goals(only PT should resolve) Patient Will Transfer Sit To/From Stand 11/13/2016 1101 by [name], Riven Beebe F, PT Flowsheets Taken 11/13/2016 1101  Patient will transfer sit to/from stand Independently   Acute Rehab PT Goals(only PT should resolve) Pt Will Ambulate 11/13/2016 1101 by Tawni MillersWhite, Hale Chalfin F, PT Flowsheets Taken 11/13/2016 1101  Pt will Ambulate > 125 feet;Independently   Acute Rehab PT Goals(only PT should resolve) Pt Will Go Up/Down Stairs 11/13/2016 1101 by Tawni MillersWhite, Henrry Feil F, PT Flowsheets Taken 11/13/2016 1101  Pt will Go Up / Down Stairs Flight;with modified independence  [employer]gy CorporationDawn Penda Venturi,PT Acute Rehabilitation [phone] (pager)

## 2016-11-13 NOTE — Progress Notes (Signed)
Patient is ready to make some major life changes.  He came in very sick and could have died. He recognizes he needs help and wants to get into a rehab program ASAP.  I shared about one program I was familiar with in case he would like to check them out.  Had great conversation and encouraged patient and praised him for making this decision.  Shared a NT with him that has references for scriptures of encouragement. Excited for this patient starting on a new journey. Phebe CollaDonna S Javonne Dorko, Chaplain   11/13/16 1000  Clinical Encounter Type  Visited With Patient  Visit Type Initial;Spiritual support;Social support  Spiritual Encounters  Spiritual Needs Literature;Prayer;Emotional  Stress Factors  Patient Stress Factors Major life changes  Family Stress Factors Family relationships

## 2016-11-13 NOTE — Evaluation (Signed)
Physical Therapy Evaluation Patient Details Name: Alex HallmarkMichael H Schrag MRN: [id number] DOB: 1965-09-12 Today's Date: 11/13/2016   History of Present Illness  Alex Logan is a 51 y.o. male.  Had been vomiting a few days.  Friend found pt on floor and pt had been drinking.  Pt also with SIRS. Hx of ETOH abuse.   Clinical Impression  Pt admitted with above diagnosis. Pt currently with functional limitations due to the deficits listed below (see PT Problem List). Pt was able to ambulate without device on unit.  Pt with one significant LOB which pt self corrected when performing balance test.  Will follow acutely.  Pt plans to go to Alcohol REhab prior to going home.   Pt will benefit from skilled PT to increase their independence and safety with mobility to allow discharge to the venue listed below.      Follow Up Recommendations No PT follow up    Equipment Recommendations  None recommended by PT    Recommendations for Other Services       Precautions / Restrictions Precautions Precautions: Fall Restrictions Weight Bearing Restrictions: No      Mobility  Bed Mobility Overal bed mobility: Independent                Transfers Overall transfer level: Independent                  Ambulation/Gait Ambulation/Gait assistance: Min guard;Min assist Ambulation Distance (Feet): 700 Feet Assistive device: None Gait Pattern/deviations: Step-through pattern;Decreased stride length;Staggering right   Gait velocity interpretation: Below normal speed for age/gender General Gait Details: Pt ambulating well overall with only LOB with mod challenges to balance.  Pt states he is close to baseline but is just a little weak.  Happy to be up and moving.  States he is going to Alcohol Rehab to get help.  Stairs            Wheelchair Mobility    Modified Rankin (Stroke Patients Only)       Balance Overall balance assessment: Needs assistance Sitting-balance support: No  upper extremity supported;Feet supported Sitting balance-Leahy Scale: Fair     Standing balance support: No upper extremity supported;During functional activity Standing balance-Leahy Scale: Fair Standing balance comment: can stand statically and maintain balance.  Needed steadying assist with high level dynamic activity.                  Standardized Balance Assessment Standardized Balance Assessment : Dynamic Gait Index   Dynamic Gait Index Level Surface: Normal Change in Gait Speed: Normal Gait with Horizontal Head Turns: Normal Gait with Vertical Head Turns: Mild Impairment Gait and Pivot Turn: Normal Step Over Obstacle: Normal Step Around Obstacles: Moderate Impairment Steps: Mild Impairment Total Score: 20       Pertinent Vitals/Pain Pain Assessment: No/denies pain  98% on RA O2, 108 bpm, 141/98.    Home Living Family/patient expects to be discharged to:: Private residence Living Arrangements: Alone Available Help at Discharge: Family;Available PRN/intermittently Type of Home: House Home Access: Stairs to enter Entrance Stairs-Rails: None Entrance Stairs-Number of Steps: 1 Home Layout: Two level;1/2 bath on main level;Bed/bath upstairs Home Equipment: None      Prior Function Level of Independence: Independent               Hand Dominance        Extremity/Trunk Assessment   Upper Extremity Assessment Upper Extremity Assessment: Defer to OT evaluation    Lower Extremity Assessment  Lower Extremity Assessment: Generalized weakness    Cervical / Trunk Assessment Cervical / Trunk Assessment: Normal  Communication   Communication: No difficulties  Cognition Arousal/Alertness: Awake/alert Behavior During Therapy: WFL for tasks assessed/performed Overall Cognitive Status: Within Functional Limits for tasks assessed                                        General Comments General comments (skin integrity, edema, etc.): Pt  scored 20/24 on DGI suggesting low risk of falls.  Did discuss with pt that he could fall if challenged and to be aware of that and pt is in agreement.  Nursing to walk with pt throughout day to incr his mobility.     Exercises     Assessment/Plan    PT Assessment Patient needs continued PT services  PT Problem List Decreased balance;Decreased mobility;Decreased activity tolerance;Decreased knowledge of use of DME;Decreased safety awareness;Decreased knowledge of precautions       PT Treatment Interventions DME instruction;Gait training;Functional mobility training;Therapeutic activities;Therapeutic exercise;Balance training;Stair training;Patient/family education    PT Goals (Current goals can be found in the Care Plan section)  Acute Rehab PT Goals Patient Stated Goal: to go home PT Goal Formulation: With patient Time For Goal Achievement: 11/20/16 Potential to Achieve Goals: Good    Frequency Min 3X/week   Barriers to discharge        Co-evaluation               AM-PAC PT "6 Clicks" Daily Activity  Outcome Measure Difficulty turning over in bed (including adjusting bedclothes, sheets and blankets)?: None Difficulty moving from lying on back to sitting on the side of the bed? : None Difficulty sitting down on and standing up from a chair with arms (e.g., wheelchair, bedside commode, etc,.)?: None Help needed moving to and from a bed to chair (including a wheelchair)?: None Help needed walking in hospital room?: A Little Help needed climbing 3-5 steps with a railing? : A Little 6 Click Score: 22    End of Session Equipment Utilized During Treatment: Gait belt Activity Tolerance: Patient limited by fatigue Patient left: in chair;with call bell/phone within reach;with chair alarm set Nurse Communication: Mobility status PT Visit Diagnosis: Unsteadiness on feet (R26.81);Muscle weakness (generalized) (M62.81)    Time: 0940-1003 PT Time Calculation (min) (ACUTE  ONLY): 23 min   Charges:   PT Evaluation $PT Eval Moderate Complexity: 1 Mod PT Treatments $Gait Training: 8-22 mins   PT G Codes:        Heinrich Fertig,PT Acute Rehabilitation [phone] (pager)   Berline Lopes 11/13/2016, 11:12 AM

## 2016-11-13 NOTE — Progress Notes (Addendum)
PROGRESS NOTE  Alex Logan:[id number] DOB: Dec 28, 1965 DOA: 11/10/2016 PCP: System, Pcp Not In  HPI/Recap of past 24 hours: Pt seen and examined at his bedside accompanied by his father. Voices interest to go to alcohol rehab. Denies any pain and admits to feeling anxious. Prn ativan administered. Denies abd pain/nausea/vomiting.  Assessment/Plan: Active Problems:   Alcohol withdrawal (HCC)   SIRS (systemic inflammatory response syndrome) (HCC)   High anion gap metabolic acidosis   Lactic acidosis   Hyponatremia   Starvation ketoacidosis   Pressure injury of skin  Code Status:Full  Family Communication:No family at bedside  Disposition Plan:Will stay another midnight to continue current management   Consultants:  None  Procedures:  None  Antimicrobials:  None   DVT prophylaxis: hep 5000 u TID sq   Objective: Vitals:   11/13/16 0430 11/13/16 0532 11/13/16 0747 11/13/16 0800  BP: (!) 139/109 (!) 126/96 (!) 141/98 (!) 135/110  Pulse:   94 (!) 102  Resp:   18   Temp:   98.1 F (36.7 C)   TempSrc:   Oral   SpO2:   100%   Weight:      Height:        Intake/Output Summary (Last 24 hours) at 11/13/2016 1125 Last data filed at 11/13/2016 1122 Gross per 24 hour  Intake 960 ml  Output 2800 ml  Net -1840 ml   Filed Weights   11/10/16 1422 11/10/16 2000  Weight: 68 kg (150 lb) 61.2 kg (134 lb 14.7 oz)    Exam:   General:51 year old caucasian male, thin built, laying in bed in NAD.  Eyes:Pupils round and reactive to light. Icteric sclerae  ENT:Dry mucous membranes, no erythema or exudates  Neck:No JVD or thyromegaly  Cardiovascular:RRR with no murmurs rubs or gallops  Respiratory:CTA with no wheezes or rhonchi  Abdomen:Soft NT ND with NBS x 4 quadrants  Skin:Abrasion in left elbow  Musculoskeletal:Moves all limbs freely  Psychiatric:Mood is appropriate for condition and setting  Neurologic:No noted  neurological or focal deficits; anxious   Data Reviewed: CBC: Recent Labs  Lab 11/10/16 1451 11/10/16 1455 11/11/16 0633 11/12/16 0601 11/13/16 0453  WBC  --  14.4* 6.9 4.7 4.6  NEUTROABS  --  12.8*  --   --   --   HGB 16.7 16.1 12.1* 12.3* 12.0*  HCT 49.0 43.6 35.6* 36.1* 35.4*  MCV  --  90.8 94.4 95.0 95.9  PLT  --  335 209 159 166   Basic Metabolic Panel: Recent Labs  Lab 11/12/16 0255 11/12/16 0601 11/12/16 1007 11/12/16 1436 11/13/16 0453  NA 130* 131* 131* 131* 132*  K 3.9 3.5 3.7 3.5 3.8  CL 96* 96* 98* 99* 98*  CO2 25 25 25 25 25   GLUCOSE 101* 95 102* 170* 120*  BUN 13 12 11 13 16   CREATININE 0.75 0.69 0.69 0.86 0.84  CALCIUM 8.5* 8.5* 8.6* 8.8* 8.8*   GFR: Estimated Creatinine Clearance: 91.1 mL/min (by C-G formula based on SCr of 0.84 mg/dL). Liver Function Tests: Recent Labs  Lab 11/10/16 1455 11/12/16 0601  AST 67* 53*  ALT 80* 54  ALKPHOS 58 45  BILITOT 2.4* 1.0  PROT 7.6 5.6*  ALBUMIN 4.5 3.4*   Recent Labs  Lab 11/10/16 1455  LIPASE 57*   No results for input(s): AMMONIA in the last 168 hours. Coagulation Profile: No results for input(s): INR, PROTIME in the last 168 hours. Cardiac Enzymes: No results for input(s): CKTOTAL, CKMB, CKMBINDEX,  TROPONINI in the last 168 hours. BNP (last 3 results) No results for input(s): PROBNP in the last 8760 hours. HbA1C: Recent Labs    11/10/16 1720  HGBA1C 5.3   CBG: No results for input(s): GLUCAP in the last 168 hours. Lipid Profile: No results for input(s): CHOL, HDL, LDLCALC, TRIG, CHOLHDL, LDLDIRECT in the last 72 hours. Thyroid Function Tests: No results for input(s): TSH, T4TOTAL, FREET4, T3FREE, THYROIDAB in the last 72 hours. Anemia Panel: No results for input(s): VITAMINB12, FOLATE, FERRITIN, TIBC, IRON, RETICCTPCT in the last 72 hours. Urine analysis:    Component Value Date/Time   COLORURINE AMBER (A) 11/10/2016 1550   APPEARANCEUR HAZY (A) 11/10/2016 1550   LABSPEC 1.027  11/10/2016 1550   PHURINE 5.0 11/10/2016 1550   GLUCOSEU NEGATIVE 11/10/2016 1550   HGBUR SMALL (A) 11/10/2016 1550   BILIRUBINUR NEGATIVE 11/10/2016 1550   KETONESUR 5 (A) 11/10/2016 1550   PROTEINUR 100 (A) 11/10/2016 1550   UROBILINOGEN 0.2 12/06/2012 1725   NITRITE NEGATIVE 11/10/2016 1550   LEUKOCYTESUR NEGATIVE 11/10/2016 1550   Sepsis Labs: @LABRCNTIP (procalcitonin:4,lacticidven:4)  ) Recent Results (from the past 240 hour(s))  Blood Culture (routine x 2)     Status: None (Preliminary result)   Collection Time: 11/10/16  3:10 PM  Result Value Ref Range Status   Specimen Description BLOOD LEFT FOREARM  Final   Special Requests   Final    BOTTLES DRAWN AEROBIC AND ANAEROBIC Blood Culture adequate volume   Culture NO GROWTH 3 DAYS  Final   Report Status PENDING  Incomplete  Blood Culture (routine x 2)     Status: None (Preliminary result)   Collection Time: 11/10/16  3:14 PM  Result Value Ref Range Status   Specimen Description BLOOD RIGHT ANTECUBITAL  Final   Special Requests   Final    BOTTLES DRAWN AEROBIC AND ANAEROBIC Blood Culture results may not be optimal due to an excessive volume of blood received in culture bottles   Culture NO GROWTH 3 DAYS  Final   Report Status PENDING  Incomplete  MRSA PCR Screening     Status: None   Collection Time: 11/10/16  8:30 PM  Result Value Ref Range Status   MRSA by PCR NEGATIVE NEGATIVE Final    Comment:        The GeneXpert MRSA Assay (FDA approved for NASAL specimens only), is one component of a comprehensive MRSA colonization surveillance program. It is not intended to diagnose MRSA infection nor to guide or monitor treatment for MRSA infections.       Studies: No results found.  Scheduled Meds: . escitalopram  10 mg Oral Daily  . heparin subcutaneous  5,000 Units Subcutaneous Q8H  . traZODone  50 mg Oral QHS,MR X 1    Continuous Infusions:   LOS: 3 days   Assessment/Plan  Acute alcohol  withdrawal -Alcohol withdrawal protocol with CIWA -Closely monitor symptoms and vital signs -Fall and seizure precaution -Neuro checks  Chronic Alcohol abuse -Case worker consulted to aid in alcohol rehab placement -pt willing to quit alcohol  -will attempt to transfer to rehab from hospital if possible  Acute irritant contact dermatitis, present on admission -From urine and feces -affected parts include buttocks and perineum -desitin cream  Hypertensive urgency most likely 2/2 to alcohol withdrawal -Resolving -IV labetalol prn sbp>180 or dbp>105 -No prior hx of hTN  Hiccups -resolved  SIRS, most likely reactive from early phase of alcohol withdrawal -resolved  Lactic acidosis most likely 2/2 to alcohol  withdrawal -resolved -IV fluid hydration  Moderate hyponatremia -improving -Most likely related to chronic alcohol use vs others -Na+ 14031from 123 on presentation 11/10/16 -IV fluid NS -Repeat BMP in am  AKI withoutCKD -resolved -cr 0.7 in sept 2018 -cr 2.03on presentation -IV fluid hydration -Avoid nephrotoxic agents -BMP in the am  High anion gap metabolic acidosis most likely multifactorial 2/2 AKI vs lactic acidosis -resolved - Anion gap 27, serum bicarb 20,lactic acid 15 (11/10/16)  Starvation ketoacidosis -resolved  Elevated LFTs most likely related to chronic alcohol use - 53 from AST 67, 54 from ALT 80admission - Chemistry panelam  Elevated lipase -lipase 57admission -most likely due to chronic alcohol use -no abd pain  Hyperglycemia with no known hx of diabetes - Get A1C5.3; BS 136 from 235 On admission - Insulin sliding scale sensitive with hypoglycemic protocol  Hyperkalemia -Resolved -BMP  Depression/anxiety -Escitalopram, trazodone   Darlin Droparole N Gladies Sofranko, MD [facility] Pager [phone]  If 7PM-7AM, please contact night-coverage [email or web address] Password [email or web address] 11/13/2016, 11:25 AM

## 2016-11-13 NOTE — Progress Notes (Signed)
Tested Dr Margo AyeHall about patient continue to have increase heart rate >  120"s, SBP 130, and DBP >100 however does not meet criteria to give Labetalol.

## 2016-11-14 LAB — BASIC METABOLIC PANEL
ANION GAP: 6 (ref 5–15)
BUN: 13 mg/dL (ref 6–20)
CALCIUM: 9.2 mg/dL (ref 8.9–10.3)
CO2: 28 mmol/L (ref 22–32)
CREATININE: 0.86 mg/dL (ref 0.61–1.24)
Chloride: 102 mmol/L (ref 101–111)
Glucose, Bld: 104 mg/dL — ABNORMAL HIGH (ref 65–99)
Potassium: 4.7 mmol/L (ref 3.5–5.1)
Sodium: 136 mmol/L (ref 135–145)

## 2016-11-14 MED ORDER — SODIUM CHLORIDE 0.9 % IV SOLN
INTRAVENOUS | Status: DC
  Administered 2016-11-14 – 2016-11-15 (×2): via INTRAVENOUS

## 2016-11-14 NOTE — Progress Notes (Signed)
Physical Therapy Treatment Patient Details Name: Alex HallmarkMichael H Logan MRN: [id number] DOB: 1965/03/20 Today's Date: 11/14/2016    History of Present Illness Alex Logan is a 51 y.o. male.  Had been vomiting a few days.  Friend found pt on floor and pt had been drinking.  Pt also with SIRS. Hx of ETOH abuse.     PT Comments    Pt admitted with above diagnosis. Pt currently with functional limitations due to balance and endurance deficits. Pt was able to ambulate with mild gait instability with challenges.  Pt with alcohol withdrawal and incr tremors today.  Will continue acute PT.   Pt will benefit from skilled PT to increase their independence and safety with mobility to allow discharge to the venue listed below.     Follow Up Recommendations  No PT follow up     Equipment Recommendations  None recommended by PT    Recommendations for Other Services       Precautions / Restrictions Precautions Precautions: Fall Restrictions Weight Bearing Restrictions: No    Mobility  Bed Mobility Overal bed mobility: Independent                Transfers Overall transfer level: Independent                  Ambulation/Gait Ambulation/Gait assistance: Min guard;Min assist Ambulation Distance (Feet): 800 Feet Assistive device: None Gait Pattern/deviations: Step-through pattern;Decreased stride length;Staggering right   Gait velocity interpretation: <1.8 ft/sec, indicative of risk for recurrent falls General Gait Details: Pt ambulating well overall with only LOB with mod challenges to balance.  Pt states he is close to baseline but is just a little weak.  Pt was more shaky today at rest and noted with ambulation however nurse states his alcohol withdrawal is worse.     Stairs Stairs: Yes   Stair Management: One rail Left;Forwards;Alternating pattern Number of Stairs: 10 General stair comments: No LOB on stairs.  Wheelchair Mobility    Modified Rankin (Stroke Patients  Only)       Balance Overall balance assessment: Needs assistance Sitting-balance support: No upper extremity supported;Feet supported Sitting balance-Leahy Scale: Fair     Standing balance support: No upper extremity supported;During functional activity Standing balance-Leahy Scale: Fair Standing balance comment: can stand statically and maintain balance.  Needed steadying assist with high level dynamic activity.                             Cognition Arousal/Alertness: Awake/alert Behavior During Therapy: WFL for tasks assessed/performed Overall Cognitive Status: Within Functional Limits for tasks assessed                                        Exercises      General Comments        Pertinent Vitals/Pain Pain Assessment: No/denies pain   VSS Home Living                      Prior Function            PT Goals (current goals can now be found in the care plan section) Acute Rehab PT Goals Patient Stated Goal: to go home Progress towards PT goals: Progressing toward goals    Frequency    Min 3X/week      PT Plan Current plan  remains appropriate    Co-evaluation              AM-PAC PT "6 Clicks" Daily Activity  Outcome Measure  Difficulty turning over in bed (including adjusting bedclothes, sheets and blankets)?: None Difficulty moving from lying on back to sitting on the side of the bed? : None Difficulty sitting down on and standing up from a chair with arms (e.g., wheelchair, bedside commode, etc,.)?: None Help needed moving to and from a bed to chair (including a wheelchair)?: None Help needed walking in hospital room?: A Little Help needed climbing 3-5 steps with a railing? : A Little 6 Click Score: 22    End of Session Equipment Utilized During Treatment: Gait belt Activity Tolerance: Patient tolerated treatment well Patient left: in chair;with call bell/phone within reach;with chair alarm set Nurse  Communication: Mobility status PT Visit Diagnosis: Unsteadiness on feet (R26.81);Muscle weakness (generalized) (M62.81)     Time: 1610-9604 PT Time Calculation (min) (ACUTE ONLY): 15 min  Charges:  $Gait Training: 8-22 mins                    G Codes:       Colinda Barth,PT Acute Rehabilitation [phone] (pager)    Berline Lopes 11/14/2016, 11:18 AM

## 2016-11-14 NOTE — Plan of Care (Signed)
  Acute Rehab PT Goals(only PT should resolve) Pt Will Go Supine/Side To Sit 11/14/2016 1120 - Progressing by [name], [name] F, PT 11/14/2016 0827 - Progressing by [name], [name] F, PT   Acute Rehab PT Goals(only PT should resolve) Patient Will Transfer Sit To/From Stand 11/14/2016 1120 - Progressing by [name], [name] F, PT 11/14/2016 0827 - Progressing by [name], [name] F, PT   Acute Rehab PT Goals(only PT should resolve) Pt Will Ambulate 11/14/2016 1120 - Progressing by [name], [name] F, PT 11/14/2016 0827 - Progressing by [name], [name] F, PT   Acute Rehab PT Goals(only PT should resolve) Pt Will Go Up/Down Stairs 11/14/2016 1120 - Progressing by [name], [name] F, PT 11/14/2016 0827 - Progressing by [name], [name] F, PT  [name],PT Acute Rehabilitation [phone] (pager)

## 2016-11-14 NOTE — Progress Notes (Signed)
CSW informed that pt has court date tomorrow and needing documentation that he is in the hospital and unable to attend.  CSW intern met with patient and confirmed that court date is at [facility] tomorrow- believes he has a Nurse, adult and provided contact information- [place] intern spoke with Nurse, adult office who states they are NOT representing patient tomorrow but advised CSW should provide letter to [employer]   Letter faxed to [place] office and original provided to patient  CSW signing off  Alex Logan, [facility] Social Worker [phone]

## 2016-11-14 NOTE — Progress Notes (Signed)
PROGRESS NOTE  Alex Logan ZOX:[id number] DOB: [birth year] DOA: 11/10/2016 PCP: System, Pcp Not In  HPI/Recap of past 24 hours: Hypertensive with tachycardia suspect delirium tremens in the setting of alcohol withdrawal. CIWA in place and continues to require benzos for restlessness.  Assessment/Plan: Active Problems:   Alcohol withdrawal (HCC)   SIRS (systemic inflammatory response syndrome) (HCC)   High anion gap metabolic acidosis   Lactic acidosis   Hyponatremia   Starvation ketoacidosis   Pressure injury of skin   Acute vomiting  Code Status:Full  Family Communication:No family at bedside  Disposition Plan:Will stay another midnight to continue current management   Consultants:  None  Procedures:  None  Antimicrobials:  None   DVT prophylaxis: hep 5000 u TID sq    Objective: Vitals:   11/13/16 1917 11/13/16 2326 11/14/16 0307 11/14/16 0736  BP: 117/80 117/86 123/88 (!) 128/108  Pulse: (!) 117 90 85 99  Resp: (!) 22 14 18  (!) 23  Temp: 98.4 F (36.9 C) 98.4 F (36.9 C) 98.1 F (36.7 C) 98.3 F (36.8 C)  TempSrc: Oral Oral Oral Oral  SpO2: 96% 97% 97% 97%  Weight:      Height:        Intake/Output Summary (Last 24 hours) at 11/14/2016 0752 Last data filed at 11/14/2016 0454 Gross per 24 hour  Intake 954 ml  Output 1600 ml  Net -646 ml   Filed Weights   11/10/16 1422 11/10/16 2000  Weight: 68 kg (150 lb) 61.2 kg (134 lb 14.7 oz)    Exam:   General:51 year old caucasian male, thin built, laying in bed, appears anxious  Eyes:Pupils round and reactive to light. Icteric sclerae  ENT:Dry mucous membranes, no erythema or exudates  Neck:No JVD or thyromegaly  Cardiovascular:RRR with no murmurs rubs or gallops  Respiratory:CTA with no wheezes or rhonchi  Abdomen:Soft NT ND with NBS x 4 quadrants  Skin:Abrasion in left elbow  Musculoskeletal:Moves all limbs freely  Psychiatric:Mood is appropriate for  condition and setting  Neurologic:No noted neurological or focal deficits; anxious     Data Reviewed: CBC: Recent Labs  Lab 11/10/16 1451 11/10/16 1455 11/11/16 0633 11/12/16 0601 11/13/16 0453  WBC  --  14.4* 6.9 4.7 4.6  NEUTROABS  --  12.8*  --   --   --   HGB 16.7 16.1 12.1* 12.3* 12.0*  HCT 49.0 43.6 35.6* 36.1* 35.4*  MCV  --  90.8 94.4 95.0 95.9  PLT  --  335 209 159 166   Basic Metabolic Panel: Recent Labs  Lab 11/12/16 0601 11/12/16 1007 11/12/16 1436 11/13/16 0453 11/14/16 0349  NA 131* 131* 131* 132* 136  K 3.5 3.7 3.5 3.8 4.7  CL 96* 98* 99* 98* 102  CO2 25 25 25 25 28   GLUCOSE 95 102* 170* 120* 104*  BUN 12 11 13 16 13   CREATININE 0.69 0.69 0.86 0.84 0.86  CALCIUM 8.5* 8.6* 8.8* 8.8* 9.2   GFR: Estimated Creatinine Clearance: 89 mL/min (by C-G formula based on SCr of 0.86 mg/dL). Liver Function Tests: Recent Labs  Lab 11/10/16 1455 11/12/16 0601  AST 67* 53*  ALT 80* 54  ALKPHOS 58 45  BILITOT 2.4* 1.0  PROT 7.6 5.6*  ALBUMIN 4.5 3.4*   Recent Labs  Lab 11/10/16 1455  LIPASE 57*   No results for input(s): AMMONIA in the last 168 hours. Coagulation Profile: No results for input(s): INR, PROTIME in the last 168 hours. Cardiac Enzymes: No results  for input(s): CKTOTAL, CKMB, CKMBINDEX, TROPONINI in the last 168 hours. BNP (last 3 results) No results for input(s): PROBNP in the last 8760 hours. HbA1C: No results for input(s): HGBA1C in the last 72 hours. CBG: Recent Labs  Lab 11/13/16 1918  GLUCAP 124*   Lipid Profile: No results for input(s): CHOL, HDL, LDLCALC, TRIG, CHOLHDL, LDLDIRECT in the last 72 hours. Thyroid Function Tests: No results for input(s): TSH, T4TOTAL, FREET4, T3FREE, THYROIDAB in the last 72 hours. Anemia Panel: No results for input(s): VITAMINB12, FOLATE, FERRITIN, TIBC, IRON, RETICCTPCT in the last 72 hours. Urine analysis:    Component Value Date/Time   COLORURINE AMBER (A) 11/10/2016 1550    APPEARANCEUR HAZY (A) 11/10/2016 1550   LABSPEC 1.027 11/10/2016 1550   PHURINE 5.0 11/10/2016 1550   GLUCOSEU NEGATIVE 11/10/2016 1550   HGBUR SMALL (A) 11/10/2016 1550   BILIRUBINUR NEGATIVE 11/10/2016 1550   KETONESUR 5 (A) 11/10/2016 1550   PROTEINUR 100 (A) 11/10/2016 1550   UROBILINOGEN 0.2 12/06/2012 1725   NITRITE NEGATIVE 11/10/2016 1550   LEUKOCYTESUR NEGATIVE 11/10/2016 1550   Sepsis Labs: @LABRCNTIP (procalcitonin:4,lacticidven:4)  ) Recent Results (from the past 240 hour(s))  Blood Culture (routine x 2)     Status: None (Preliminary result)   Collection Time: 11/10/16  3:10 PM  Result Value Ref Range Status   Specimen Description BLOOD LEFT FOREARM  Final   Special Requests   Final    BOTTLES DRAWN AEROBIC AND ANAEROBIC Blood Culture adequate volume   Culture NO GROWTH 3 DAYS  Final   Report Status PENDING  Incomplete  Blood Culture (routine x 2)     Status: None (Preliminary result)   Collection Time: 11/10/16  3:14 PM  Result Value Ref Range Status   Specimen Description BLOOD RIGHT ANTECUBITAL  Final   Special Requests   Final    BOTTLES DRAWN AEROBIC AND ANAEROBIC Blood Culture results may not be optimal due to an excessive volume of blood received in culture bottles   Culture NO GROWTH 3 DAYS  Final   Report Status PENDING  Incomplete  MRSA PCR Screening     Status: None   Collection Time: 11/10/16  8:30 PM  Result Value Ref Range Status   MRSA by PCR NEGATIVE NEGATIVE Final    Comment:        The GeneXpert MRSA Assay (FDA approved for NASAL specimens only), is one component of a comprehensive MRSA colonization surveillance program. It is not intended to diagnose MRSA infection nor to guide or monitor treatment for MRSA infections.       Studies: No results found.  Scheduled Meds: . chlordiazePOXIDE  10 mg Oral TID  . escitalopram  10 mg Oral Daily  . heparin subcutaneous  5,000 Units Subcutaneous Q8H  . liver oil-zinc oxide   Topical BID  .  traZODone  50 mg Oral QHS,MR X 1    Continuous Infusions:   LOS: 4 days   Assessment/Plan  Delirium Tremens in the setting of alcohol withdrawal -Alcohol withdrawal protocol with CIWA -Closely monitor symptoms and vital signs -Fall and seizure precaution -Neuro checks   Chronic Alcohol abuse -Case worker consulted to aid in alcohol rehab placement -pt willing to quit alcohol  -will attempt to transfer to rehab from hospital if possible  Acute irritant contact dermatitis, present on admission -From urine and feces -affected parts include buttocks and perineum -desitin cream  Hypertensive urgency most likely 2/2 to alcohol withdrawal -Resolving -IV labetalol prn sbp>180 or dbp>105 -pt  now states that he may have been on antihypertensive med once in the past. -Amlodipine 5 mg po daily started today  Hiccups -resolved  SIRS, most likely reactive from early phase of alcohol withdrawal -resolved  Lactic acidosis most likely 2/2 to alcohol withdrawal -resolved -IV fluid hydration  Moderate hyponatremia -improving -Most likely related to chronic alcohol use vs others -Na+ 169from 123 on presentation11/2/18 -IV fluid NS -Repeat BMPin am  AKI withoutCKD -resolved -IV fluid hydration -Avoid nephrotoxic agents -BMP in the am  High anion gap metabolic acidosis most likely multifactorial 2/2 AKI vs lactic acidosis -resolved - Anion gap 27, serum bicarb 20,lactic acid 15 (11/10/16)  Starvation ketoacidosis -resolved  Elevated LFTs most likely related to chronic alcohol use -53 fromAST 67,54 fromALT 80admission - Chemistry panelam  Elevated lipase -lipase 57admission -most likely due to chronic alcohol use -no abd pain  Hyperglycemia with no known hx of diabetes - Get A1C5.3; BS 136 from 235 On admission - Insulin sliding scale sensitive with hypoglycemic protocol  Hyperkalemia -Resolved -BMP  Depression/anxiety -pt states  depression is well controlled on current meds -Escitalopram, trazodone    Darlin Drop, MD [facility] Pager [phone]  If 7PM-7AM, please contact night-coverage [email or web address] Password [email or web address] 11/14/2016, 7:52 AM

## 2016-11-15 LAB — CULTURE, BLOOD (ROUTINE X 2)
Culture: NO GROWTH
Culture: NO GROWTH
Special Requests: ADEQUATE

## 2016-11-15 MED ORDER — AMLODIPINE BESYLATE 5 MG PO TABS
5.0000 mg | ORAL_TABLET | Freq: Every day | ORAL | Status: DC
  Administered 2016-11-15: 5 mg via ORAL
  Filled 2016-11-15: qty 1

## 2016-11-15 MED ORDER — AMLODIPINE BESYLATE 5 MG PO TABS: 5.0000 mg | ORAL_TABLET | Freq: Every day | ORAL | 0 refills | Status: DC

## 2016-11-15 NOTE — Progress Notes (Addendum)
Received call back from pts father. Pts father refusing to pick up pt for d/c stating pt is "not mentally sound" and wants pt going straight to rehab ([facility]). Requesting that MD call him. Spoke w/ Dr. Margo AyeHall and relayed all. Dr. Margo AyeHall states she will speak w/ case management

## 2016-11-15 NOTE — Plan of Care (Signed)
A&O through the shift. Ativan provided per CIWA scale. Patient requesting to leave AMA this morning. Hospitalist paged at patient request

## 2016-11-15 NOTE — Care Management Note (Addendum)
Case Management Note  Patient Details  Name: Alex Logan MRN: [id number] Date of Birth: Apr 08, 1965  Subjective/Objective:  Presents with ETOH withdrawal and DT's, for dc today, will f/u with ETOH rehab . Patient is for dc today, he states ub[employer] will be picking him up. He tried to contact his father but could not contact him.                    Action/Plan:   Expected Discharge Date:  11/15/16               Expected Discharge Plan:  Home/Self Care  In-House Referral:  Clinical Social Work  Discharge planning Services  CM Consult  Post Acute Care Choice:    Choice offered to:     DME Arranged:    DME Agency:     HH Arranged:    HH Agency:     Status of Service:  Completed, signed off  If discussed at [employer] Meetings, dates discussed:    Additional Comments:  Leone Havenaylor, Stephanos Fan [place], RN 11/15/2016, 11:09 AM

## 2016-11-15 NOTE — Discharge Summary (Addendum)
Discharge Summary  Alex Logan:[id number] DOB: Dec 12, 1965  PCP: System, Pcp Not In  Admit date: 11/10/2016 Discharge date: 11/15/2016  Time spent: 25 mins  Recommendations for Outpatient Follow-up:  1. Follow up with alcohol addiction rehabilitation 2. Follow up with PCP within a week 3. Abstain from alcoholic beverages 4. Take your medications as prescribed  Discharge Diagnoses:  Active Hospital Problems   Diagnosis Date Noted  . Acute vomiting   . Pressure injury of skin 11/12/2016  . Alcohol withdrawal (HCC) 11/10/2016  . SIRS (systemic inflammatory response syndrome) (HCC) 11/10/2016  . High anion gap metabolic acidosis 11/10/2016  . Lactic acidosis 11/10/2016  . Hyponatremia 11/10/2016  . Starvation ketoacidosis 11/10/2016    Resolved Hospital Problems  No resolved problems to display.    Discharge Condition: Stable  Diet recommendation: Cardiac healthy diet  Vitals:   11/15/16 0700 11/15/16 0723  BP: 117/70 (!) 132/119  Pulse: (!) 115 (!) 114  Resp: (!) 22 17  Temp:  97.8 F (36.6 C)  SpO2: 91% 95%    History of present illness:  Alex Logan Philemon is a 51 y.o. male with medical history significant for alcohol abuse, depression/anxiety, who [facility] withdrawal who [facility] with complaints of nausea, vomiting, and diarrhea. States he has not being able to drink his wine or eat due to persistent nausea. Onset early this morning before 6AM. Nausea with vomiting about 15 times today and watery stools throughout the day. Denies recent antibiotic use, abdominal cramps, fever, or chills. Denies cough, chest pain, or dyspnea. No reported seizure although his father states he had seizure movements 5 months ago.  Admitted for alcohol withdrawal and delirium tremens which is now resolved. During hospitalization, followed CIWA protocol with IV ativan and po librium and IV hydration. Hypertensive  with prior hx of HTN.  On the day of discharge the pt was hemodynamically stable. Recommend follow up with alcohol addiction rehab and follow up with PCP within a week.  Hospital Course:  Active Problems:   Alcohol withdrawal (HCC)   SIRS (systemic inflammatory response syndrome) (HCC)   High anion gap metabolic acidosis   Lactic acidosis   Hyponatremia   Starvation ketoacidosis   Pressure injury of skin   Acute vomiting Stage II pressure ulcer, present on admission.  Procedures:  None  Consultations:  None  Discharge Exam: BP (!) 132/119 (BP Location: Left Arm)   Pulse (!) 114   Temp 97.8 F (36.6 C) (Axillary)   Resp 17   Ht 5\' 10"  (1.778 m)   Wt 61.2 kg (134 lb 14.7 oz)   SpO2 95%   BMI 19.36 kg/m   General: 51 yo CM WD WN in NAD Cardiovascular: RRR with no murmurs rubs or gallops Respiratory: CTA with no wheezes, rales or rhonchi  Discharge Instructions You were cared for by a hospitalist during your hospital stay. If you have any questions about your discharge medications or the care you received while you were in the hospital after you are discharged, you can call the unit and asked to speak with the hospitalist on call if the hospitalist that took care of you is not available. Once you are discharged, your primary care physician will handle any further medical issues. Please note that NO REFILLS for any discharge medications will be authorized once you are discharged, as it is imperative that you return to your primary care physician (or establish a  relationship with a primary care physician if you do not have one) for your aftercare needs so that they can reassess your need for medications and monitor your lab values.   Allergies as of 11/15/2016   No Known Allergies     Medication List    TAKE these medications   amLODipine 5 MG tablet Commonly known as:  NORVASC Take 1 tablet (5 mg total) daily by mouth.   escitalopram 10 MG tablet Commonly known as:   LEXAPRO Take 1 tablet (10 mg total) by mouth daily.   naltrexone 50 MG tablet Commonly known as:  DEPADE Take 1 tablet (50 mg total) by mouth daily.   omega-3 acid ethyl esters 1 g capsule Commonly known as:  LOVAZA Take 1 capsule (1 g total) by mouth 2 (two) times daily.   traZODone 50 MG tablet Commonly known as:  DESYREL Take 1 tablet (50 mg total) by mouth at bedtime and may repeat dose one time if needed.      No Known Allergies Follow-up Information    [facility] Go on 11/17/2016.   Why:  Appt at 7:45am for assessment.  Bring 30 day supply of medications and refill as well as clothes in case you can be admitted directly. Contact information: [address] [place] [address] [phone]            The results of significant diagnostics from this hospitalization (including imaging, microbiology, ancillary and laboratory) are listed below for reference.    Significant Diagnostic Studies: Dg Chest Port 1 View  Result Date: 11/10/2016 CLINICAL DATA:  Altered mental status and shaking. EXAM: PORTABLE CHEST 1 VIEW COMPARISON:  11/25/2010 radiographs FINDINGS: The cardiomediastinal silhouette is unremarkable. There is no evidence of focal airspace disease, pulmonary edema, suspicious pulmonary nodule/mass, pleural effusion, or pneumothorax. No acute bony abnormalities are identified. IMPRESSION: No active disease. Electronically Signed   By: Harmon Pier M.D.   On: 11/10/2016 15:27    Microbiology: Recent Results (from the past 240 hour(s))  Blood Culture (routine x 2)     Status: None (Preliminary result)   Collection Time: 11/10/16  3:10 PM  Result Value Ref Range Status   Specimen Description BLOOD LEFT FOREARM  Final   Special Requests   Final    BOTTLES DRAWN AEROBIC AND ANAEROBIC Blood Culture adequate volume   Culture NO GROWTH 4 DAYS  Final   Report Status PENDING  Incomplete  Blood Culture (routine x 2)     Status: None (Preliminary  result)   Collection Time: 11/10/16  3:14 PM  Result Value Ref Range Status   Specimen Description BLOOD RIGHT ANTECUBITAL  Final   Special Requests   Final    BOTTLES DRAWN AEROBIC AND ANAEROBIC Blood Culture results may not be optimal due to an excessive volume of blood received in culture bottles   Culture NO GROWTH 4 DAYS  Final   Report Status PENDING  Incomplete  MRSA PCR Screening     Status: None   Collection Time: 11/10/16  8:30 PM  Result Value Ref Range Status   MRSA by PCR NEGATIVE NEGATIVE Final    Comment:        The GeneXpert MRSA Assay (FDA approved for NASAL specimens only), is one component of a comprehensive MRSA colonization surveillance program. It is not intended to diagnose MRSA infection nor to guide or monitor treatment for MRSA infections.      Labs: Basic Metabolic Panel: Recent Labs  Lab  11/12/16 0601 11/12/16 1007 11/12/16 1436 11/13/16 0453 11/14/16 0349  NA 131* 131* 131* 132* 136  K 3.5 3.7 3.5 3.8 4.7  CL 96* 98* 99* 98* 102  CO2 25 25 25 25 28   GLUCOSE 95 102* 170* 120* 104*  BUN 12 11 13 16 13   CREATININE 0.69 0.69 0.86 0.84 0.86  CALCIUM 8.5* 8.6* 8.8* 8.8* 9.2   Liver Function Tests: Recent Labs  Lab 11/10/16 1455 11/12/16 0601  AST 67* 53*  ALT 80* 54  ALKPHOS 58 45  BILITOT 2.4* 1.0  PROT 7.6 5.6*  ALBUMIN 4.5 3.4*   Recent Labs  Lab 11/10/16 1455  LIPASE 57*   No results for input(s): AMMONIA in the last 168 hours. CBC: Recent Labs  Lab 11/10/16 1451 11/10/16 1455 11/11/16 0633 11/12/16 0601 11/13/16 0453  WBC  --  14.4* 6.9 4.7 4.6  NEUTROABS  --  12.8*  --   --   --   HGB 16.7 16.1 12.1* 12.3* 12.0*  HCT 49.0 43.6 35.6* 36.1* 35.4*  MCV  --  90.8 94.4 95.0 95.9  PLT  --  335 209 159 166   Cardiac Enzymes: No results for input(s): CKTOTAL, CKMB, CKMBINDEX, TROPONINI in the last 168 hours. BNP: BNP (last 3 results) No results for input(s): BNP in the last 8760 hours.  ProBNP (last 3 results) No  results for input(s): PROBNP in the last 8760 hours.  CBG: Recent Labs  Lab 11/13/16 1918  GLUCAP 124*       Signed:  Darlin Droparole N Lorelie Biermann, MD [facility] 11/15/2016, 8:59 AM

## 2016-11-15 NOTE — Progress Notes (Signed)
Spoke w/ Case Manger Letha CapeDeborah Taylor who states pt will d/c to home, being picked up by [employer]. Pt taken downstairs via wheelchair.

## 2016-11-15 NOTE — Progress Notes (Signed)
Discharge paperwork, instructions, and new prescriptions discussed/explained to patient. Pt states no questions or concerns. Have attempted to contact pts dad 3x. Unable to reach. Plan is for pt to go home w/ dad until he checks in at recovery center on Friday.

## 2017-01-30 ENCOUNTER — Encounter (HOSPITAL_COMMUNITY): Payer: Self-pay | Admitting: Emergency Medicine

## 2017-01-30 ENCOUNTER — Other Ambulatory Visit: Payer: Self-pay

## 2017-01-30 ENCOUNTER — Inpatient Hospital Stay (HOSPITAL_COMMUNITY)
Admission: EM | Admit: 2017-01-30 | Discharge: 2017-01-31 | DRG: 894 | Discharge status: Against Medical Advice | Payer: Managed Care, Other (non HMO) | Attending: Internal Medicine | Admitting: Internal Medicine

## 2017-01-30 DIAGNOSIS — F10939 Alcohol use, unspecified with withdrawal, unspecified: Secondary | ICD-10-CM | POA: Diagnosis present

## 2017-01-30 DIAGNOSIS — F1010 Alcohol abuse, uncomplicated: Secondary | ICD-10-CM

## 2017-01-30 DIAGNOSIS — F10239 Alcohol dependence with withdrawal, unspecified: Secondary | ICD-10-CM | POA: Diagnosis present

## 2017-01-30 DIAGNOSIS — F329 Major depressive disorder, single episode, unspecified: Secondary | ICD-10-CM | POA: Diagnosis present

## 2017-01-30 DIAGNOSIS — F339 Major depressive disorder, recurrent, unspecified: Secondary | ICD-10-CM

## 2017-01-30 DIAGNOSIS — E872 Acidosis, unspecified: Secondary | ICD-10-CM | POA: Diagnosis present

## 2017-01-30 DIAGNOSIS — Z79899 Other long term (current) drug therapy: Secondary | ICD-10-CM

## 2017-01-30 DIAGNOSIS — F419 Anxiety disorder, unspecified: Secondary | ICD-10-CM | POA: Diagnosis present

## 2017-01-30 DIAGNOSIS — F10931 Alcohol use, unspecified with withdrawal delirium: Secondary | ICD-10-CM

## 2017-01-30 DIAGNOSIS — Z560 Unemployment, unspecified: Secondary | ICD-10-CM

## 2017-01-30 DIAGNOSIS — F10930 Alcohol use, unspecified with withdrawal, uncomplicated: Secondary | ICD-10-CM

## 2017-01-30 DIAGNOSIS — Y903 Blood alcohol level of 60-79 mg/100 ml: Secondary | ICD-10-CM | POA: Diagnosis present

## 2017-01-30 DIAGNOSIS — R030 Elevated blood-pressure reading, without diagnosis of hypertension: Secondary | ICD-10-CM | POA: Diagnosis present

## 2017-01-30 DIAGNOSIS — F1020 Alcohol dependence, uncomplicated: Secondary | ICD-10-CM | POA: Diagnosis present

## 2017-01-30 DIAGNOSIS — Z811 Family history of alcohol abuse and dependence: Secondary | ICD-10-CM

## 2017-01-30 DIAGNOSIS — F1021 Alcohol dependence, in remission: Secondary | ICD-10-CM | POA: Diagnosis present

## 2017-01-30 DIAGNOSIS — R740 Nonspecific elevation of levels of transaminase and lactic acid dehydrogenase [LDH]: Secondary | ICD-10-CM | POA: Diagnosis present

## 2017-01-30 DIAGNOSIS — F10231 Alcohol dependence with withdrawal delirium: Secondary | ICD-10-CM

## 2017-01-30 DIAGNOSIS — F10230 Alcohol dependence with withdrawal, uncomplicated: Principal | ICD-10-CM | POA: Diagnosis present

## 2017-01-30 HISTORY — DX: Depression, unspecified: F32.A

## 2017-01-30 HISTORY — DX: Major depressive disorder, single episode, unspecified: F32.9

## 2017-01-30 LAB — COMPREHENSIVE METABOLIC PANEL
ALT: 118 U/L — AB (ref 17–63)
AST: 115 U/L — AB (ref 15–41)
Albumin: 4.6 g/dL (ref 3.5–5.0)
Alkaline Phosphatase: 49 U/L (ref 38–126)
Anion gap: 24 — ABNORMAL HIGH (ref 5–15)
BUN: 10 mg/dL (ref 6–20)
CHLORIDE: 96 mmol/L — AB (ref 101–111)
CO2: 18 mmol/L — AB (ref 22–32)
CREATININE: 1.19 mg/dL (ref 0.61–1.24)
Calcium: 9.4 mg/dL (ref 8.9–10.3)
GFR calc Af Amer: >60 mL/min (ref >60)
GFR calc non Af Amer: >60 mL/min (ref >60)
Glucose, Bld: 92 mg/dL (ref 65–99)
Potassium: 4 mmol/L (ref 3.5–5.1)
SODIUM: 138 mmol/L (ref 135–145)
Total Bilirubin: 1.3 mg/dL — ABNORMAL HIGH (ref 0.3–1.2)
Total Protein: 7.4 g/dL (ref 6.5–8.1)

## 2017-01-30 LAB — I-STAT VENOUS BLOOD GAS, ED
Acid-base deficit: 4 mmol/L — ABNORMAL HIGH (ref 0.0–2.0)
Bicarbonate: 18.9 mmol/L — ABNORMAL LOW (ref 20.0–28.0)
O2 Saturation: 89 %
TCO2: 20 mmol/L — ABNORMAL LOW (ref 22–32)
pCO2, Ven: 27.4 mmHg — ABNORMAL LOW (ref 44.0–60.0)
pH, Ven: 7.447 — ABNORMAL HIGH (ref 7.250–7.430)
pO2, Ven: 53 mmHg — ABNORMAL HIGH (ref 32.0–45.0)

## 2017-01-30 LAB — RAPID URINE DRUG SCREEN, HOSP PERFORMED
Amphetamines: NOT DETECTED
BENZODIAZEPINES: NOT DETECTED
Barbiturates: NOT DETECTED
Cocaine: NOT DETECTED
Opiates: NOT DETECTED
Tetrahydrocannabinol: NOT DETECTED

## 2017-01-30 LAB — CBC
HEMATOCRIT: 47.4 % (ref 39.0–52.0)
Hemoglobin: 17 g/dL (ref 13.0–17.0)
MCH: 33.1 pg (ref 26.0–34.0)
MCHC: 35.9 g/dL (ref 30.0–36.0)
MCV: 92.4 fL (ref 78.0–100.0)
Platelets: 319 10*3/uL (ref 150–400)
RBC: 5.13 MIL/uL (ref 4.22–5.81)
RDW: 12.2 % (ref 11.5–15.5)
WBC: 11.8 10*3/uL — AB (ref 4.0–10.5)

## 2017-01-30 LAB — I-STAT CG4 LACTIC ACID, ED
Lactic Acid, Venous: 5.62 mmol/L (ref 0.5–1.9)
Lactic Acid, Venous: 1.75 mmol/L (ref 0.5–1.9)

## 2017-01-30 LAB — ETHANOL: Alcohol, Ethyl (B): 65 mg/dL — ABNORMAL HIGH (ref <10)

## 2017-01-30 MED ORDER — SODIUM CHLORIDE 0.9% FLUSH: 3.0000 mL | INTRAVENOUS | Status: DC | PRN

## 2017-01-30 MED ORDER — THIAMINE HCL 100 MG/ML IJ SOLN: 100.0000 mg | Freq: Every day | INTRAMUSCULAR | Status: DC

## 2017-01-30 MED ORDER — SODIUM CHLORIDE 0.9 % IV SOLN
250.0000 mL | INTRAVENOUS | Status: DC | PRN
Start: 2017-01-30 — End: 2017-01-31

## 2017-01-30 MED ORDER — LABETALOL HCL 5 MG/ML IV SOLN
5.0000 mg | Freq: Once | INTRAVENOUS | Status: AC
  Administered 2017-01-30: 5 mg via INTRAVENOUS
  Filled 2017-01-30: qty 4

## 2017-01-30 MED ORDER — ENOXAPARIN SODIUM 40 MG/0.4ML ~~LOC~~ SOLN
40.0000 mg | SUBCUTANEOUS | Status: DC
  Administered 2017-01-30: 40 mg via SUBCUTANEOUS
  Filled 2017-01-30: qty 0.4

## 2017-01-30 MED ORDER — SODIUM CHLORIDE 0.9% FLUSH
3.0000 mL | Freq: Two times a day (BID) | INTRAVENOUS | Status: DC
  Administered 2017-01-30 – 2017-01-31 (×2): 3 mL via INTRAVENOUS

## 2017-01-30 MED ORDER — ADULT MULTIVITAMIN W/MINERALS CH
1.0000 | ORAL_TABLET | Freq: Every day | ORAL | Status: DC
  Administered 2017-01-31: 1 via ORAL
  Filled 2017-01-30 (×2): qty 1

## 2017-01-30 MED ORDER — ESCITALOPRAM OXALATE 10 MG PO TABS
10.0000 mg | ORAL_TABLET | Freq: Every day | ORAL | Status: DC
  Administered 2017-01-30 – 2017-01-31 (×2): 10 mg via ORAL
  Filled 2017-01-30 (×2): qty 1

## 2017-01-30 MED ORDER — LORAZEPAM 2 MG/ML IJ SOLN
2.0000 mg | Freq: Once | INTRAMUSCULAR | Status: AC
  Administered 2017-01-30: 2 mg via INTRAVENOUS
  Filled 2017-01-30: qty 1

## 2017-01-30 MED ORDER — SODIUM CHLORIDE 0.9 % IV BOLUS (SEPSIS)
2000.0000 mL | Freq: Once | INTRAVENOUS | Status: AC
  Administered 2017-01-30: 2000 mL via INTRAVENOUS

## 2017-01-30 MED ORDER — SODIUM CHLORIDE 0.9 % IV SOLN: 250.0000 mL | INTRAVENOUS | Status: DC | PRN

## 2017-01-30 MED ORDER — CHLORDIAZEPOXIDE HCL 5 MG PO CAPS
10.0000 mg | ORAL_CAPSULE | Freq: Three times a day (TID) | ORAL | Status: DC
  Administered 2017-01-30 (×3): 10 mg via ORAL
  Filled 2017-01-30 (×3): qty 2

## 2017-01-30 MED ORDER — FOLIC ACID 1 MG PO TABS
1.0000 mg | ORAL_TABLET | Freq: Every day | ORAL | Status: DC
  Administered 2017-01-31: 1 mg via ORAL
  Filled 2017-01-30: qty 1

## 2017-01-30 MED ORDER — SODIUM CHLORIDE 0.9% FLUSH
3.0000 mL | Freq: Two times a day (BID) | INTRAVENOUS | Status: DC
  Administered 2017-01-30: 3 mL via INTRAVENOUS

## 2017-01-30 MED ORDER — VITAMIN B-1 100 MG PO TABS
100.0000 mg | ORAL_TABLET | Freq: Every day | ORAL | Status: DC
  Administered 2017-01-30 – 2017-01-31 (×2): 100 mg via ORAL
  Filled 2017-01-30 (×2): qty 1

## 2017-01-30 MED ORDER — LORAZEPAM 2 MG/ML IJ SOLN: 2.0000 mg | INTRAMUSCULAR | Status: DC | PRN

## 2017-01-30 MED ORDER — LORAZEPAM 2 MG/ML IJ SOLN
0.0000 mg | Freq: Four times a day (QID) | INTRAMUSCULAR | Status: DC
  Administered 2017-01-30: 2 mg via INTRAVENOUS
  Filled 2017-01-30: qty 2

## 2017-01-30 MED ORDER — SODIUM CHLORIDE 0.9% FLUSH
3.0000 mL | INTRAVENOUS | Status: DC | PRN
Start: 2017-01-30 — End: 2017-01-31

## 2017-01-30 MED ORDER — LORAZEPAM 1 MG PO TABS: 0.0000 mg | ORAL_TABLET | Freq: Two times a day (BID) | ORAL | Status: DC

## 2017-01-30 MED ORDER — LORAZEPAM 1 MG PO TABS
0.0000 mg | ORAL_TABLET | Freq: Four times a day (QID) | ORAL | Status: DC
  Administered 2017-01-30 (×2): 1 mg via ORAL
  Filled 2017-01-30 (×2): qty 1

## 2017-01-30 MED ORDER — LORAZEPAM 2 MG/ML IJ SOLN: 0.0000 mg | Freq: Two times a day (BID) | INTRAMUSCULAR | Status: DC

## 2017-01-30 MED ORDER — THIAMINE HCL 100 MG/ML IJ SOLN
Freq: Once | INTRAVENOUS | Status: AC
  Administered 2017-01-30: 08:00:00 via INTRAVENOUS
  Filled 2017-01-30: qty 1000

## 2017-01-30 NOTE — Clinical Social Work Note (Signed)
Clinical Social Work Assessment  Patient Details  Name: Alex Logan MRN: [id number] Date of Birth: 1965/11/08  Date of referral:  01/30/17               Reason for consult:  Substance Use/ETOH Abuse                Permission sought to share information with:    Permission granted to share information::     Name::        Agency::     Relationship::     Contact Information:     Housing/Transportation Living arrangements for the past 2 months:  Single Family Home(alone. ) Source of Information:  Patient Patient Interpreter Needed:  None Criminal Activity/Legal Involvement Pertinent to Current Situation/Hospitalization:  No - Comment as needed Significant Relationships:  Significant Other, Adult Children Lives with:  Self Do you feel safe going back to the place where you live?  Yes Need for family participation in patient care:  Yes (Comment)  Care giving concerns:  CSW spoke with Alex Logan at bedside. Alex Logan does not express any concerns to CSW at this time.    Social Worker assessment / plan:  CSW spoke with Alex Logan at bedside. Alex Logan reports that Alex Logan has been on and off drinking for the last 12 years. Alex Logan reports that there are no set causes to Alex Logan's drinking, however Alex Logan reports trying to stop in the past but relapsing again. CSW was informed that Alex Logan has an ex wife and girlfriend who are both helpful in Alex Logan's care. CSW offered support and resources to Alex Logan and Alex Logan is agreeable to ETOH treatment at the time of discharge.   Employment status:  Unemployed Health and safety inspectornsurance information:  Self Pay (Medicaid Pending) Alex Logan Recommendations:  Not assessed at this time Information / Referral to community resources:  Outpatient Psychiatric Care (Comment Required), Residential Substance Abuse Treatment Options  Patient/Family's Response to care:  Alex Logan appeared to be understanding and agreeable to plan of care.   Patient/Family's Understanding of and Emotional Response to Diagnosis, Current Treatment, and Prognosis:  No  further questions or concerns have been presented to CSW at this time.   Emotional Assessment Appearance:  Appears older than stated age Attitude/Demeanor/Rapport:    Affect (typically observed):  Calm, Appropriate, Pleasant Orientation:  Oriented to Situation, Oriented to Self, Oriented to Place, Oriented to  Time Alcohol / Substance use:    Psych involvement (Current and /or in the community):  No (Comment)(not at this time.)  Discharge Needs  Concerns to be addressed:    Readmission within the last 30 days:  No Current discharge risk:  Abuse(ETOH) Barriers to Discharge:  Continued Medical Work up   Sempra EnergyKierra S Shemuel Harkleroad, LCSWA 01/30/2017, 1:58 PM

## 2017-01-30 NOTE — ED Triage Notes (Signed)
Pt admits to ED for detox. Usually drinks about "two big bottles of wine a day" reports last drink 24 hours ago. Non compliant with medications.

## 2017-01-30 NOTE — ED Provider Notes (Signed)
[facility] EMERGENCY DEPARTMENT Provider Note   CSN: [id number] Arrival date & time: 01/30/17  0602     History   Chief Complaint Chief Complaint  Patient presents with  . Alcohol Intoxication    HPI Alex Logan is a 52 y.o. male.  HPI Patient presents to the emergency department with tremors and shaking.  The patient states that he last had alcohol on Sunday evening.  Patient states that he drank to 1.5 L bottles of wine at that time.  He states he did have a little bit of alcohol intermittently since that time but nothing significant.  The patient states that nothing seems to make the condition better or worse.  Patient states he did not take any medications prior to arrival.  He states he has been admitted previously for similar issues.  He states he was not seizing or intubated at that time.  The patient denies chest pain, shortness of breath, headache,blurred vision, neck pain, fever, cough, weakness, numbness, dizziness, anorexia, edema, abdominal pain, nausea, vomiting, diarrhea, rash, back pain, dysuria, hematemesis, bloody stool, near syncope, or syncope. Past Medical History:  Diagnosis Date  . Alcohol abuse     Patient Active Problem List   Diagnosis Date Noted  . Acute vomiting   . Pressure injury of skin 11/12/2016  . Alcohol withdrawal (HCC) 11/10/2016  . SIRS (systemic inflammatory response syndrome) (HCC) 11/10/2016  . High anion gap metabolic acidosis 11/10/2016  . Lactic acidosis 11/10/2016  . Hyponatremia 11/10/2016  . Starvation ketoacidosis 11/10/2016  . Alcohol use disorder, severe, dependence (HCC) 09/27/2016  . Alcohol abuse with alcohol-induced mood disorder (HCC) 09/22/2016    History reviewed. No pertinent surgical history.     Home Medications    Prior to Admission medications   Medication Sig Start Date End Date Taking? Authorizing Provider  amLODipine (NORVASC) 5 MG tablet Take 1 tablet (5 mg total) daily by mouth.  11/15/16  Yes Hall, Carole N, DO  escitalopram (LEXAPRO) 10 MG tablet Take 1 tablet (10 mg total) by mouth daily. 09/28/16  Yes Oneta Rack, NP  naltrexone (DEPADE) 50 MG tablet Take 1 tablet (50 mg total) by mouth daily. 09/28/16  Yes Oneta Rack, NP  omega-3 acid ethyl esters (LOVAZA) 1 g capsule Take 1 capsule (1 g total) by mouth 2 (two) times daily. 09/27/16  Yes Oneta Rack, NP    Family History Family History  Problem Relation Age of Onset  . Mental illness Neg Hx     Social History Social History   Tobacco Use  . Smoking status: Never Smoker  . Smokeless tobacco: Never Used  Substance Use Topics  . Alcohol use: Yes    Comment: vodka every other day  . Drug use: No     Allergies   Patient has no known allergies.   Review of Systems Review of Systems All other systems negative except as documented in the HPI. All pertinent positives and negatives as reviewed in the HPI.  Physical Exam Updated Vital Signs BP (!) 164/122   Pulse (!) 132   Temp 97.8 F (36.6 C) (Oral)   Resp 18   Ht 5\' 10"  (1.778 m)   Wt 72.6 kg (160 lb)   SpO2 97%   BMI 22.96 kg/m   Physical Exam  Constitutional: He is oriented to person, place, and time. He appears well-developed and well-nourished. No distress.  HENT:  Head: Normocephalic and atraumatic.  Mouth/Throat: Oropharynx is clear and moist.  Eyes: Pupils are equal, round, and reactive to light.  Neck: Normal range of motion. Neck supple.  Cardiovascular: Regular rhythm and normal heart sounds. Tachycardia present. Exam reveals no gallop and no friction rub.  No murmur heard. Pulmonary/Chest: Effort normal and breath sounds normal. No respiratory distress. He has no wheezes.  Abdominal: Soft. Bowel sounds are normal. He exhibits no distension. There is no tenderness. There is no rebound and no guarding.  Musculoskeletal: He exhibits no edema.  Neurological: He is alert and oriented to person, place, and time. No sensory  deficit. He exhibits normal muscle tone. Coordination normal.  Patient is very shaky on exam.  Skin: Skin is warm and dry. Capillary refill takes less than 2 seconds. No rash noted. No erythema.  Psychiatric: He has a normal mood and affect. His behavior is normal.  Nursing note and vitals reviewed.    ED Treatments / Results  Labs (all labs ordered are listed, but only abnormal results are displayed) Labs Reviewed  COMPREHENSIVE METABOLIC PANEL - Abnormal; Notable for the following components:      Result Value   Chloride 96 (*)    CO2 18 (*)    AST 115 (*)    ALT 118 (*)    Total Bilirubin 1.3 (*)    Anion gap 24 (*)    All other components within normal limits  ETHANOL - Abnormal; Notable for the following components:   Alcohol, Ethyl (B) 65 (*)    All other components within normal limits  CBC - Abnormal; Notable for the following components:   WBC 11.8 (*)    All other components within normal limits  I-STAT CG4 LACTIC ACID, ED - Abnormal; Notable for the following components:   Lactic Acid, Venous 5.62 (*)    All other components within normal limits  I-STAT VENOUS BLOOD GAS, ED - Abnormal; Notable for the following components:   pH, Ven 7.447 (*)    pCO2, Ven 27.4 (*)    pO2, Ven 53.0 (*)    Bicarbonate 18.9 (*)    TCO2 20 (*)    Acid-base deficit 4.0 (*)    All other components within normal limits  RAPID URINE DRUG SCREEN, HOSP PERFORMED    EKG  EKG Interpretation None       Radiology No results found.  Procedures Procedures (including critical care time)  Medications Ordered in ED Medications  LORazepam (ATIVAN) injection 0-4 mg (2 mg Intravenous Given 01/30/17 0706)    Or  LORazepam (ATIVAN) tablet 0-4 mg ( Oral See Alternative 01/30/17 0706)  LORazepam (ATIVAN) injection 0-4 mg (not administered)    Or  LORazepam (ATIVAN) tablet 0-4 mg (not administered)  thiamine (VITAMIN B-1) tablet 100 mg (not administered)    Or  thiamine (B-1) injection  100 mg (not administered)  sodium chloride 0.9 % 1,000 mL with thiamine 100 mg, folic acid 1 mg, multivitamins adult 10 mL infusion ( Intravenous New Bag/Given 01/30/17 0759)     Initial Impression / Assessment and Plan / ED Course  I have reviewed the triage vital signs and the nursing notes.  Pertinent labs & imaging results that were available during my care of the patient were reviewed by me and considered in my medical decision making (see chart for details).     CRITICAL CARE Performed by: Jamesetta Orleanshristopher W Oris Staffieri Total critical care time: 30 minutes Critical care time was exclusive of separately billable procedures and treating other patients. Critical care was necessary to treat or prevent  imminent or life-threatening deterioration. Critical care was time spent personally by me on the following activities: development of treatment plan with patient and/or surrogate as well as nursing, discussions with consultants, evaluation of patient's response to treatment, examination of patient, obtaining history from patient or surrogate, ordering and performing treatments and interventions, ordering and review of laboratory studies, ordering and review of radiographic studies, pulse oximetry and re-evaluation of patient's condition. Patient was somewhat unstable on initial examination with a heart rate in the 150s due to his alcohol withdrawal.  Patient was placed on CIWAA protocol given a banana bag and monitor closely.  Patient did not respond appropriately to these therapies but still tachycardic.  The patient will be admitted to the hospital to the stepdown unit.  Patient is advised of the plan I did consult the internal medicine resident service who will admit the patient.  Final Clinical Impressions(s) / ED Diagnoses   Final diagnoses:  Alcohol abuse  DTs (delirium tremens) [facility])  Metabolic acidosis    ED Discharge Orders    None       Charlestine Night, PA-C 01/30/17 6962      Azalia Bilis, MD 01/30/17 2223

## 2017-01-30 NOTE — ED Notes (Signed)
Doctor at bedside.

## 2017-01-30 NOTE — ED Notes (Signed)
Breakfast Tray Ordered @ [id number].

## 2017-01-30 NOTE — H&P (Signed)
Date: 01/30/2017               Patient Name:  Alex Logan MRN: [id number]  DOB: 01/18/1965 Age / Sex: 52 y.o., male   PCP: System, Pcp Not In         Medical Service: Internal Medicine Teaching Service         Attending Physician: Dr. Earl Lagos, MD    First Contact: Dr. Evelene Croon Pager: [phone]  Second Contact: Dr. Obie Dredge Pager: [phone]       After Hours (After 5p/  First Contact Pager: [phone]  weekends / holidays): Second Contact Pager: [phone]   Chief Complaint: Alcohol withdrawl  History of Present Illness:  Alex Logan is a 52 year old male with a past medical history significant for alcohol abuse with previous DTs and depression presenting for alcohol withdrawal. He reports that he has been drinking daily for the past 20 years but has increased over the past year. He reports drinking a 0.5L of vodka daily for the past year. He says that over the past week he has stopped drinking vodka and switched to wine. He reports drinking 1.5L bottles of wine for the past week with his last drink Sunday, two days ago. He says that he still felt drunk when all day Monday despite not having anything to drink. Overnight he was unable to keep anything down and would vomit whenever he tried to drink any water. He then became very anxious and tremulous and presented to the ED this morning. He reports admissions previously for alcohol withdrawal. He denies any history of seizures, DT or requiring intubation. Previous notes do indicate concerns for DTs. He denies any chest pain, palpitations, shortness of breath, headache, vision changes, fevers, chills, abdominal pain, diarrhea, diaphoresis.   In the ED, he was noted to be tachycardic in the 150s and tremulous on examination. Received Ativan with some improvement in symptoms however remained tachycardic with heart rate in the 120-130s. Note to have an elevated lactate of 5.6 and anion gap of 24. Alcohol level 65. AST and ALT elevated to 115  and 118 respectively.    Meds:  Current Meds  Medication Sig  . amLODipine (NORVASC) 5 MG tablet Take 1 tablet (5 mg total) daily by mouth.  . escitalopram (LEXAPRO) 10 MG tablet Take 1 tablet (10 mg total) by mouth daily.  . naltrexone (DEPADE) 50 MG tablet Take 1 tablet (50 mg total) by mouth daily.  Marland Kitchen omega-3 acid ethyl esters (LOVAZA) 1 g capsule Take 1 capsule (1 g total) by mouth 2 (two) times daily.    Allergies: Allergies as of 01/30/2017  . (No Known Allergies)   Past Medical History:  Diagnosis Date  . Alcohol abuse     Family History:  Family History  Problem Relation Age of Onset  . Emphysema Mother   . Bipolar disorder Father   . Alcohol abuse Father   . Mental illness Neg Hx     Social History:  Social History   Socioeconomic History  . Marital status: Divorced    Spouse name: None  . Number of children: None  . Years of education: None  . Highest education level: None  Social Needs  . Financial resource strain: None  . Food insecurity - worry: None  . Food insecurity - inability: None  . Transportation needs - medical: None  . Transportation needs - non-medical: None  Occupational History  . None  Tobacco Use  . Smoking  status: Never Smoker  . Smokeless tobacco: Never Used  Substance and Sexual Activity  . Alcohol use: Yes  . Drug use: No  . Sexual activity: None  Other Topics Concern  . None  Social History Narrative  . None    Review of Systems: A complete ROS was negative except as per HPI.   Physical Exam: Blood pressure (!) 164/122, pulse (!) 136, temperature 97.8 F (36.6 C), temperature source Oral, resp. rate 18, height 5\' 10"  (1.778 m), weight 160 lb (72.6 kg), SpO2 97 %. General: alert, well-developed, and cooperative to examination.  Head: normocephalic and atraumatic.  Eyes: vision grossly intact, pupils equal, pupils round, pupils reactive to light, no injection and anicteric.  Mouth: pharynx pink and dry, no erythema,  and no exudates.  Neck: supple, full ROM, no thyromegaly, no JVD, and no carotid bruits.  Lungs: normal respiratory effort, no accessory muscle use, normal breath sounds, no crackles, and no wheezes. Heart: tachycardic, regular rhythm, no murmur, no gallop, and no rub.  Abdomen: soft, non-tender, normal bowel sounds, no distention, no guarding, no rebound tenderness, no hepatomegaly, and no splenomegaly.  Msk: no joint swelling, no joint warmth, and no redness over joints.  Pulses: 2+ DP/PT pulses bilaterally Extremities: No cyanosis, clubbing, edema Neurologic: alert & oriented X3, no focal deficits. Tremulous on exam.  Skin: turgor normal and no rashes.  Psych: Anxious appearing  EKG: personally reviewed my interpretation is sinus tachycardia   Assessment & Plan by Problem:  Acute Alcohol Withdrawal: Patient presenting with acute alcohol withdrawal with last drink approximately 36 hours ago. He does have a history of DTs noted on 11/2016 admission. No history of seizures and has not required intubation previously. He remains tachycardic and tremulous following initial dose of Ativan in the ED though ex-wife at bedside and patient note significant improvement after receiving the dose. Will need close monitoring; alcohol withdrawal with CIWA protocol Will admit to step down unit. Given his history of DTs, will start librium 10 mg tid for maintenance dose with Ativan prn for acute withdrawal symptoms.   Elevated Blood Pressure: Likely 2/2 above. Will monitor closely.   Chronic Alcohol Abuse: Patient with > 20 year history of daily alcohol use. He reports being on naltrexone in the past which has helped and wishes to re-start this medication. Will hold for now given his transaminitis. He expresses a desire to quit. Will consult social work.   Metabolic Acidosis 2/2 Lactic Acidosis: Lactate 5.6 in the ED. Appears volume deplete on exam with vomiting prior to presneation and poor PO intake.  Receiving 1L NS with thiamine and folate in the ED. Will trend lactate and bolus as needed. He is able to take in PO currently without issue.    Transaminitis:   Likely 2/2 his chronic alcohol use. Has had previous elevations of his LFTs with improvement following cessation. Will re-check LFTs in the morning.   Depression; Continue Lexapro 10 mg dilay  DVT prophylaxis: Lovenox  Code Status: Full  Dispo: Admit patient to Inpatient with expected length of stay greater than 2 midnights.  Signed: Valentino NoseBoswell, Raneem Mendolia, MD 01/30/2017, 8:51 AM  Pager: [phone]

## 2017-01-30 NOTE — ED Notes (Signed)
Pt's wife left contact number Annabelle Harman- [place] - [phone]

## 2017-01-30 NOTE — ED Notes (Signed)
Pt given peanut butter and crackers per Tobi BastosAnna, RN

## 2017-01-31 DIAGNOSIS — F10239 Alcohol dependence with withdrawal, unspecified: Secondary | ICD-10-CM

## 2017-01-31 DIAGNOSIS — Z5321 Procedure and treatment not carried out due to patient leaving prior to being seen by health care provider: Secondary | ICD-10-CM

## 2017-01-31 DIAGNOSIS — E872 Acidosis: Secondary | ICD-10-CM

## 2017-01-31 DIAGNOSIS — R030 Elevated blood-pressure reading, without diagnosis of hypertension: Secondary | ICD-10-CM

## 2017-01-31 DIAGNOSIS — R000 Tachycardia, unspecified: Secondary | ICD-10-CM

## 2017-01-31 LAB — COMPREHENSIVE METABOLIC PANEL
ALK PHOS: 48 U/L (ref 38–126)
ALT: 88 U/L — AB (ref 17–63)
AST: 86 U/L — ABNORMAL HIGH (ref 15–41)
Albumin: 3.6 g/dL (ref 3.5–5.0)
Anion gap: 9 (ref 5–15)
BILIRUBIN TOTAL: 1.5 mg/dL — AB (ref 0.3–1.2)
BUN: 9 mg/dL (ref 6–20)
CALCIUM: 9 mg/dL (ref 8.9–10.3)
CO2: 25 mmol/L (ref 22–32)
CREATININE: 0.92 mg/dL (ref 0.61–1.24)
Chloride: 102 mmol/L (ref 101–111)
GFR calc non Af Amer: >60 mL/min (ref >60)
Glucose, Bld: 97 mg/dL (ref 65–99)
Potassium: 4.9 mmol/L (ref 3.5–5.1)
Sodium: 136 mmol/L (ref 135–145)
TOTAL PROTEIN: 5.8 g/dL — AB (ref 6.5–8.1)

## 2017-01-31 LAB — HIV ANTIBODY (ROUTINE TESTING W REFLEX): HIV SCREEN 4TH GENERATION: NONREACTIVE

## 2017-01-31 MED ORDER — ACETAMINOPHEN 325 MG PO TABS
650.0000 mg | ORAL_TABLET | Freq: Four times a day (QID) | ORAL | Status: DC | PRN
  Administered 2017-01-31: 650 mg via ORAL
  Filled 2017-01-31: qty 2

## 2017-01-31 NOTE — Plan of Care (Signed)
  Progressing Physical Regulation: Complications related to the disease process, condition or treatment will be avoided or minimized 01/31/2017 0554 - Progressing by Burtis Junesrewery, Lebert Lovern, RN Continue to monitor vitals and for neuro changes. Patient has no signs of seizure activity or change in mental status Safety: Ability to remain free from injury will improve Reminded to use call bell if needs assistance out of bed, bed alarm on. 01/31/2017 [id number] - Progressing by Burtis Junesrewery, Tashara Suder, RN

## 2017-01-31 NOTE — Progress Notes (Signed)
Patient requesting to leave AMA

## 2017-01-31 NOTE — Progress Notes (Signed)
Subjective:  Patient remained tachycardic and hypertensive overnight, otherwise no acute events. This morning patient states he is feeling much better than yesterday. He was able to tolerate breakfast this morning. Heart rate is mildly improved and now in 120-130s. BP 150-180s. He is eager to go home and states he has a job interview tomorrow that he does not want to miss. Explain to patient we will need to continue monitoring him. Discuss there is small chance of discharge home today as he continues be within the window for severe symptoms from alcohol withdrawal. Patient voiced understanding and stated he will stay until tomorrow if needed. He is in agreement with the plan. All questions answered.   Objective:  Vital signs in last 24 hours: Vitals:   01/30/17 2339 01/31/17 0030 01/31/17 0116 01/31/17 0517  BP: (!) 163/115  (!) 137/95   Pulse: 92  88   Resp: 19  16   Temp:  97.7 F (36.5 C)  97.8 F (36.6 C)  TempSrc:      SpO2: 96%  96%   Weight:      Height:       General: Very pleasant male very pleasant male, well-nourished, well-developed, tremulous, sitting up in bed eating breakfast in no acute distress HENT: NCAT, neck supple and FROM, no tongue fasciculations noted   Cardiac: tachycardic and regular rhythm, nl S1/S2, no murmurs, rubs or gallops  Pulm: CTAB, no wheezes or crackles, no increased work of breathing  Abd: soft, NTND, bowel sounds present Neuro: A&Ox3, Tremulous on exam though improved from yesterday, no other focal deficits noted  Ext: warm and well perfused, no peripheral edema   Assessment/Plan:  Principal Problem:   Alcohol withdrawal (HCC) Active Problems:   Alcohol use disorder, severe, dependence (HCC)   Lactic acidosis  # Acute Alcohol Withdrawal: Patient presenting with acute alcohol withdrawal with last drink approximately 72 hours ago. He does have a history of DTs noted on 11/2016 admission. No history of seizures and has not required  intubation previously. He remains tachycardic and hypertensive, though HR improved from yesterday. Tremulousness improving as well. CIWA 5-6. Last dose of Librium last night at 9PM and has not received Ativan this morning.  Patient is eager to go home.  However, he continues to be within the window for DTs and is at high risk for this given prior history.  We will would like to monitor at least until tomorrow. - Continue cardiac monitoring  - Discontinue Librium  - Continue Ativan PRN per CIWA protocol  - Continue to monitor HR and BP  - SW consulted as patient requested information/resourced about alcohol cessation counseling   # Elevated Blood Pressure: in the setting of acute alcohol withdrawal.  No previous history of hypertension.  Management as above.   - Will continue to monitor closely  # Chronic Alcohol Abuse: Patient with > 20 year history of daily alcohol use. He reports being on naltrexone in the past which has helped and wishes to re-start this medication. Will hold for now given his transaminitis. He expresses a desire to quit.  - SW consult placed  - Continue Vitamin B and folate   # Anion gap metabolic Acidosis 2/2 Lactic Acidosis: Lactate 5.6 in the ED. Appears volume deplete on exam with vomiting prior to presentatino and poor PO intake. Receiving 1L NS with thiamine and folate in the ED. This is now resolved. Repeat lactate 1.75. He is now tolerating PO intake.  - Resolved   #  Transaminitis: Likely 2/2 his chronic alcohol use. Has had previous elevations of his LFTs with improvement following cessation. Repeat LFTs improved, AST 86 and ALT 88.  - Continue to monitor   # Depression; - Continue home Lexapro 10 mg dilay   Dispo: Anticipated discharge in approximately 1-2 day(s).   Burna Cash, MD 01/31/2017, 6:34 AM Pager: [phone]

## 2017-01-31 NOTE — Progress Notes (Signed)
CSW received call that pt leaving AMA and wanting list of alcohol rehab resources- CSW provided list for patient- previous CSW had already completed assessment with patient regarding substance use  [name], LCSW Clinical Social Worker [phone]

## 2017-01-31 NOTE — Discharge Summary (Signed)
Name: Alex Logan MRN: [id number] DOB: 1965/11/02 52 y.o. PCP: System, Pcp Not In  Date of Admission: 01/30/2017  6:11 AM Date of Discharge: 01/31/2017 Attending Physician: Dr. Heide SparkNarendra   Discharge Diagnosis: 1. Alcohol withdrawal   Principal Problem:   Alcohol withdrawal (HCC) Active Problems:   Alcohol use disorder, severe, dependence (HCC)   Lactic acidosis   Discharge Medications: Allergies as of 01/31/2017   No Known Allergies     Medication List    ASK your doctor about these medications   amLODipine 5 MG tablet Commonly known as:  NORVASC Take 1 tablet (5 mg total) daily by mouth.   escitalopram 10 MG tablet Commonly known as:  LEXAPRO Take 1 tablet (10 mg total) by mouth daily.   naltrexone 50 MG tablet Commonly known as:  DEPADE Take 1 tablet (50 mg total) by mouth daily.   omega-3 acid ethyl esters 1 g capsule Commonly known as:  LOVAZA Take 1 capsule (1 g total) by mouth 2 (two) times daily.       Disposition and follow-up:   [facility]Wilbon H [facility]ious [facility].  At the hospital follow up visit please address:  1.  Please assess for signs and symptoms of alcohol withdrawal. Please consider prescribing naltrexone for patient. Please continue to counsel patient on the importance alcohol cessation.   2.  Labs / imaging needed at time of follow-up: CMP to check LFTs   3.  Pending labs/ test needing follow-up: None   Follow[facility]up Appointments: [facility]    Hospital Course by problem list: Principal Problem:   Alcohol withdrawal (HCC) Active Problems:   Alcohol use disorder, severe, dependence (HCC)   Lactic acidosis   1. Alcohol withdrawal: Patient presented with generalized malaise and tremulousness. He was found to be tachycardic with HR 150s and hypertensive with sBP 160-180s in the ED. His last drink was 2 days prior to admission. He had a lactic acidosis of 5.6 that resolved after  2L NS bolus. He was placed on CIWA protocol and managed with Librium and Ativan PRN. He initially expressed desire to be discharge home the next day due to a previously scheduled job interview. The medical team discussed with patient need for close observation in the setting of alcohol withdrawal and previous history of DTs (he was within the window on presentation). On HD#2, patient remained tachycardic and hypertensive (mildly improved from admission), however he left AMA. Unfortunately his medical team was notified after patient had left the building.    Discharge Vitals:   BP (!) 160/109   Pulse (!) 103   Temp 98.4 F (36.9 C) (Oral)   Resp 19   Ht 5\' 10"  (1.778 m)   Wt 171 lb 15.3 oz (78 kg)   SpO2 95%   BMI 24.67 kg/m   Vitals:   01/31/17 0517 01/31/17 0623 01/31/17 0624 01/31/17 1440  BP:  (!) 155/113 (!) 160/109   Pulse:  (!) 103 (!) 103   Resp:  18 19   Temp: 97.8 F (36.6 C)   98.4 F (36.9 C)  TempSrc:    Oral  SpO2:  96% 95%   Weight:      Height:        Pertinent Labs, Studies, and Procedures:   CBC Latest Ref Rng & Units 01/30/2017 11/13/2016 11/12/2016  WBC 4.0 - 10.5 K/uL 11.8(H) 4.6 4.7  Hemoglobin 13.0 - 17.0 g/dL 56.217.0 12.0(L) 12.3(L)  Hematocrit 39.0 -  52.0 % 47.4 35.4(L) 36.1(L)  Platelets 150 - 400 K/uL 319 166 159   CMP Latest Ref Rng & Units 01/31/2017 01/30/2017 11/14/2016  Glucose 65 - 99 mg/dL 97 92 161(W)  BUN 6 - 20 mg/dL 9 10 13   Creatinine 0.61 - 1.24 mg/dL 9.60 4.54 0.98  Sodium 135 - 145 mmol/L 136 138 136  Potassium 3.5 - 5.1 mmol/L 4.9 4.0 4.7  Chloride 101 - 111 mmol/L 102 96(L) 102  CO2 22 - 32 mmol/L 25 18(L) 28  Calcium 8.9 - 10.3 mg/dL 9.0 9.4 9.2  Total Protein 6.5 - 8.1 g/dL 1.1(B) 7.4 -  Total Bilirubin 0.3 - 1.2 mg/dL 1.4(N) 1.3(H) -  Alkaline Phos 38 - 126 U/L 48 49 -  AST 15 - 41 U/L 86(H) 115(H) -  ALT 17 - 63 U/L 88(H) 118(H) -   BAL 65  Lactate 5.62 1/22 --> 1.75 1/22  UDS negative   HIV negative   No imaging  ordered during this admission    Discharge Instructions: Patient left AMA    Signed: Burna Cash, MD 02/01/2017, 5:20 AM   Pager: [phone]

## 2017-05-21 ENCOUNTER — Emergency Department (HOSPITAL_COMMUNITY)
Admission: EM | Admit: 2017-05-21 | Discharge: 2017-05-22 | Discharge status: Against Medical Advice | Payer: Managed Care, Other (non HMO)

## 2017-05-23 ENCOUNTER — Other Ambulatory Visit: Payer: Self-pay

## 2017-05-23 ENCOUNTER — Inpatient Hospital Stay (HOSPITAL_COMMUNITY)
Admission: EM | Admit: 2017-05-23 | Discharge: 2017-05-27 | DRG: 897 | Disposition: A | Discharge status: Home / Self Care | Payer: Managed Care, Other (non HMO) | Attending: Internal Medicine | Admitting: Internal Medicine

## 2017-05-23 ENCOUNTER — Emergency Department (HOSPITAL_COMMUNITY): Payer: Self-pay

## 2017-05-23 ENCOUNTER — Encounter (HOSPITAL_COMMUNITY): Payer: Self-pay

## 2017-05-23 DIAGNOSIS — F419 Anxiety disorder, unspecified: Secondary | ICD-10-CM | POA: Diagnosis present

## 2017-05-23 DIAGNOSIS — K760 Fatty (change of) liver, not elsewhere classified: Secondary | ICD-10-CM | POA: Diagnosis present

## 2017-05-23 DIAGNOSIS — K8020 Calculus of gallbladder without cholecystitis without obstruction: Secondary | ICD-10-CM

## 2017-05-23 DIAGNOSIS — F1021 Alcohol dependence, in remission: Secondary | ICD-10-CM | POA: Diagnosis present

## 2017-05-23 DIAGNOSIS — R1011 Right upper quadrant pain: Secondary | ICD-10-CM

## 2017-05-23 DIAGNOSIS — F10239 Alcohol dependence with withdrawal, unspecified: Secondary | ICD-10-CM

## 2017-05-23 DIAGNOSIS — D72829 Elevated white blood cell count, unspecified: Secondary | ICD-10-CM | POA: Diagnosis present

## 2017-05-23 DIAGNOSIS — I471 Supraventricular tachycardia, unspecified: Secondary | ICD-10-CM

## 2017-05-23 DIAGNOSIS — R7989 Other specified abnormal findings of blood chemistry: Secondary | ICD-10-CM

## 2017-05-23 DIAGNOSIS — Z818 Family history of other mental and behavioral disorders: Secondary | ICD-10-CM

## 2017-05-23 DIAGNOSIS — E876 Hypokalemia: Secondary | ICD-10-CM | POA: Diagnosis present

## 2017-05-23 DIAGNOSIS — M7918 Myalgia, other site: Secondary | ICD-10-CM

## 2017-05-23 DIAGNOSIS — E872 Acidosis: Secondary | ICD-10-CM | POA: Diagnosis present

## 2017-05-23 DIAGNOSIS — D61818 Other pancytopenia: Secondary | ICD-10-CM

## 2017-05-23 DIAGNOSIS — I110 Hypertensive heart disease with heart failure: Secondary | ICD-10-CM | POA: Diagnosis present

## 2017-05-23 DIAGNOSIS — R109 Unspecified abdominal pain: Secondary | ICD-10-CM

## 2017-05-23 DIAGNOSIS — K709 Alcoholic liver disease, unspecified: Secondary | ICD-10-CM | POA: Diagnosis present

## 2017-05-23 DIAGNOSIS — Z79899 Other long term (current) drug therapy: Secondary | ICD-10-CM

## 2017-05-23 DIAGNOSIS — G9340 Encephalopathy, unspecified: Secondary | ICD-10-CM | POA: Diagnosis present

## 2017-05-23 DIAGNOSIS — F329 Major depressive disorder, single episode, unspecified: Secondary | ICD-10-CM | POA: Diagnosis present

## 2017-05-23 DIAGNOSIS — K700 Alcoholic fatty liver: Secondary | ICD-10-CM

## 2017-05-23 DIAGNOSIS — F1020 Alcohol dependence, uncomplicated: Secondary | ICD-10-CM | POA: Diagnosis present

## 2017-05-23 DIAGNOSIS — Z811 Family history of alcohol abuse and dependence: Secondary | ICD-10-CM

## 2017-05-23 DIAGNOSIS — I10 Essential (primary) hypertension: Secondary | ICD-10-CM | POA: Diagnosis present

## 2017-05-23 DIAGNOSIS — F10939 Alcohol use, unspecified with withdrawal, unspecified: Secondary | ICD-10-CM | POA: Diagnosis present

## 2017-05-23 DIAGNOSIS — I5030 Unspecified diastolic (congestive) heart failure: Secondary | ICD-10-CM | POA: Diagnosis present

## 2017-05-23 DIAGNOSIS — F10931 Alcohol use, unspecified with withdrawal delirium: Secondary | ICD-10-CM

## 2017-05-23 DIAGNOSIS — R945 Abnormal results of liver function studies: Secondary | ICD-10-CM

## 2017-05-23 DIAGNOSIS — F418 Other specified anxiety disorders: Secondary | ICD-10-CM

## 2017-05-23 DIAGNOSIS — F1010 Alcohol abuse, uncomplicated: Secondary | ICD-10-CM

## 2017-05-23 DIAGNOSIS — F10231 Alcohol dependence with withdrawal delirium: Principal | ICD-10-CM | POA: Diagnosis present

## 2017-05-23 LAB — URINALYSIS, ROUTINE W REFLEX MICROSCOPIC
BILIRUBIN URINE: NEGATIVE
GLUCOSE, UA: NEGATIVE mg/dL
KETONES UR: 20 mg/dL — AB
Leukocytes, UA: NEGATIVE
NITRITE: NEGATIVE
PH: 5 (ref 5.0–8.0)
PROTEIN: 100 mg/dL — AB
Specific Gravity, Urine: 1.021 (ref 1.005–1.030)

## 2017-05-23 LAB — CBC
HEMATOCRIT: 47.2 % (ref 39.0–52.0)
HEMOGLOBIN: 16.3 g/dL (ref 13.0–17.0)
MCH: 31.5 pg (ref 26.0–34.0)
MCHC: 34.5 g/dL (ref 30.0–36.0)
MCV: 91.1 fL (ref 78.0–100.0)
Platelets: 205 10*3/uL (ref 150–400)
RBC: 5.18 MIL/uL (ref 4.22–5.81)
RDW: 13.4 % (ref 11.5–15.5)
WBC: 12.6 10*3/uL — ABNORMAL HIGH (ref 4.0–10.5)

## 2017-05-23 LAB — RAPID URINE DRUG SCREEN, HOSP PERFORMED
Amphetamines: NOT DETECTED
Barbiturates: NOT DETECTED
Benzodiazepines: NOT DETECTED
Cocaine: NOT DETECTED
Opiates: NOT DETECTED
Tetrahydrocannabinol: NOT DETECTED

## 2017-05-23 LAB — COMPREHENSIVE METABOLIC PANEL
ALBUMIN: 4.8 g/dL (ref 3.5–5.0)
ALT: 113 U/L — ABNORMAL HIGH (ref 17–63)
ANION GAP: 29 — AB (ref 5–15)
AST: 139 U/L — ABNORMAL HIGH (ref 15–41)
Alkaline Phosphatase: 68 U/L (ref 38–126)
BUN: 15 mg/dL (ref 6–20)
CO2: 18 mmol/L — AB (ref 22–32)
Calcium: 9.1 mg/dL (ref 8.9–10.3)
Chloride: 90 mmol/L — ABNORMAL LOW (ref 101–111)
Creatinine, Ser: 0.97 mg/dL (ref 0.61–1.24)
GFR calc non Af Amer: >60 mL/min (ref >60)
GLUCOSE: 86 mg/dL (ref 65–99)
POTASSIUM: 4.5 mmol/L (ref 3.5–5.1)
SODIUM: 137 mmol/L (ref 135–145)
Total Bilirubin: 1 mg/dL (ref 0.3–1.2)
Total Protein: 8.2 g/dL — ABNORMAL HIGH (ref 6.5–8.1)

## 2017-05-23 LAB — LIPASE, BLOOD: LIPASE: 37 U/L (ref 11–51)

## 2017-05-23 LAB — ETHANOL: Alcohol, Ethyl (B): 177 mg/dL — ABNORMAL HIGH (ref <10)

## 2017-05-23 LAB — MRSA PCR SCREENING: MRSA by PCR: NEGATIVE

## 2017-05-23 LAB — PHOSPHORUS: Phosphorus: 2.1 mg/dL — ABNORMAL LOW (ref 2.5–4.6)

## 2017-05-23 LAB — MAGNESIUM: Magnesium: 1.7 mg/dL (ref 1.7–2.4)

## 2017-05-23 MED ORDER — DEXMEDETOMIDINE HCL IN NACL 200 MCG/50ML IV SOLN
0.4000 ug/kg/h | INTRAVENOUS | Status: DC
  Administered 2017-05-23 – 2017-05-24 (×3): 0.4 ug/kg/h via INTRAVENOUS
  Filled 2017-05-23 (×3): qty 50

## 2017-05-23 MED ORDER — SODIUM CHLORIDE 0.9 % IV BOLUS
500.0000 mL | Freq: Once | INTRAVENOUS | Status: AC
  Administered 2017-05-23: 500 mL via INTRAVENOUS

## 2017-05-23 MED ORDER — FOLIC ACID 1 MG PO TABS
1.0000 mg | ORAL_TABLET | Freq: Every day | ORAL | Status: DC
  Administered 2017-05-24 – 2017-05-27 (×4): 1 mg via ORAL
  Filled 2017-05-23 (×4): qty 1

## 2017-05-23 MED ORDER — LORAZEPAM 2 MG/ML IJ SOLN
1.0000 mg | Freq: Once | INTRAMUSCULAR | Status: AC
  Administered 2017-05-23: 1 mg via INTRAVENOUS
  Filled 2017-05-23: qty 1

## 2017-05-23 MED ORDER — OMEGA-3-ACID ETHYL ESTERS 1 G PO CAPS
1.0000 g | ORAL_CAPSULE | Freq: Two times a day (BID) | ORAL | Status: DC
  Administered 2017-05-23 – 2017-05-24 (×2): 1 g via ORAL
  Filled 2017-05-23 (×2): qty 1

## 2017-05-23 MED ORDER — LORAZEPAM 2 MG/ML IJ SOLN
INTRAMUSCULAR | Status: AC
  Filled 2017-05-23: qty 1

## 2017-05-23 MED ORDER — ONDANSETRON HCL 4 MG/2ML IJ SOLN
4.0000 mg | Freq: Four times a day (QID) | INTRAMUSCULAR | Status: DC | PRN
  Administered 2017-05-25: 4 mg via INTRAVENOUS
  Filled 2017-05-23: qty 2

## 2017-05-23 MED ORDER — SODIUM CHLORIDE 0.9 % IV SOLN
INTRAVENOUS | Status: DC
  Administered 2017-05-23 – 2017-05-25 (×4): via INTRAVENOUS
  Administered 2017-05-25: 100 mL/h via INTRAVENOUS
  Administered 2017-05-26: 22:00:00 via INTRAVENOUS

## 2017-05-23 MED ORDER — ADULT MULTIVITAMIN W/MINERALS CH
1.0000 | ORAL_TABLET | Freq: Every day | ORAL | Status: DC
  Administered 2017-05-24 – 2017-05-27 (×4): 1 via ORAL
  Filled 2017-05-23 (×4): qty 1

## 2017-05-23 MED ORDER — IBUPROFEN 200 MG PO TABS
600.0000 mg | ORAL_TABLET | Freq: Four times a day (QID) | ORAL | Status: DC | PRN
  Administered 2017-05-25: 600 mg via ORAL
  Filled 2017-05-23: qty 3

## 2017-05-23 MED ORDER — ONDANSETRON HCL 4 MG PO TABS: 4.0000 mg | ORAL_TABLET | Freq: Four times a day (QID) | ORAL | Status: DC | PRN

## 2017-05-23 MED ORDER — IOPAMIDOL (ISOVUE-300) INJECTION 61%
100.0000 mL | Freq: Once | INTRAVENOUS | Status: AC | PRN
  Administered 2017-05-23: 100 mL via INTRAVENOUS

## 2017-05-23 MED ORDER — ENOXAPARIN SODIUM 40 MG/0.4ML ~~LOC~~ SOLN
40.0000 mg | SUBCUTANEOUS | Status: DC
  Administered 2017-05-23 – 2017-05-26 (×4): 40 mg via SUBCUTANEOUS
  Filled 2017-05-23 (×4): qty 0.4

## 2017-05-23 MED ORDER — ESCITALOPRAM OXALATE 10 MG PO TABS
10.0000 mg | ORAL_TABLET | Freq: Every day | ORAL | Status: DC
  Administered 2017-05-23 – 2017-05-27 (×5): 10 mg via ORAL
  Filled 2017-05-23 (×5): qty 1

## 2017-05-23 MED ORDER — AMLODIPINE BESYLATE 5 MG PO TABS
5.0000 mg | ORAL_TABLET | Freq: Every day | ORAL | Status: DC
  Administered 2017-05-23 – 2017-05-27 (×5): 5 mg via ORAL
  Filled 2017-05-23 (×5): qty 1

## 2017-05-23 MED ORDER — LORAZEPAM 2 MG/ML IJ SOLN
2.0000 mg | INTRAMUSCULAR | Status: DC | PRN
  Administered 2017-05-23: 3 mg via INTRAVENOUS
  Administered 2017-05-23 – 2017-05-25 (×12): 2 mg via INTRAVENOUS
  Administered 2017-05-25: 3 mg via INTRAVENOUS
  Administered 2017-05-25 (×2): 2 mg via INTRAVENOUS
  Administered 2017-05-25: 3 mg via INTRAVENOUS
  Administered 2017-05-25: 2 mg via INTRAVENOUS
  Filled 2017-05-23 (×6): qty 1
  Filled 2017-05-23: qty 2
  Filled 2017-05-23 (×4): qty 1
  Filled 2017-05-23 (×2): qty 2
  Filled 2017-05-23 (×5): qty 1

## 2017-05-23 MED ORDER — THIAMINE HCL 100 MG/ML IJ SOLN
Freq: Once | INTRAVENOUS | Status: AC
  Administered 2017-05-23: 19:00:00 via INTRAVENOUS
  Filled 2017-05-23: qty 1000

## 2017-05-23 MED ORDER — LORAZEPAM 2 MG/ML IJ SOLN
2.0000 mg | Freq: Once | INTRAMUSCULAR | Status: AC
  Administered 2017-05-23: 2 mg via INTRAVENOUS

## 2017-05-23 MED ORDER — IOPAMIDOL (ISOVUE-300) INJECTION 61%
INTRAVENOUS | Status: AC
  Filled 2017-05-23: qty 100

## 2017-05-23 MED ORDER — LORAZEPAM 2 MG/ML IJ SOLN
2.0000 mg | Freq: Once | INTRAMUSCULAR | Status: AC
  Administered 2017-05-23: 2 mg via INTRAVENOUS
  Filled 2017-05-23: qty 1

## 2017-05-23 MED ORDER — THIAMINE HCL 100 MG/ML IJ SOLN
100.0000 mg | Freq: Every day | INTRAMUSCULAR | Status: DC
  Administered 2017-05-24: 100 mg via INTRAVENOUS
  Filled 2017-05-23: qty 2

## 2017-05-23 NOTE — ED Notes (Signed)
ED Provider at bedside. 

## 2017-05-23 NOTE — ED Triage Notes (Signed)
Pt comes from home. Pt arrived via [facility]. Pt called 911 due to abdominal pain located on right side and increases during range of motion and slightly on palpation. No distention noted. Pt had urinated and deficated on himself before EMS arrived. Pt was standing outside with fire department and has now just stated to EMS that he can not walk. Pt can ambulate and is AOX4. Pt denies N/V. Pt stated he abuses ETOH and has not had a drink in 2 days however ETOH odor is coming form pt.

## 2017-05-23 NOTE — ED Notes (Signed)
Bed: GE95 Expected date:  Expected time:  Means of arrival:  Comments: EMS male abd pain

## 2017-05-23 NOTE — Consult Note (Signed)
PULMONARY / CRITICAL CARE MEDICINE   Name: Alex Logan MRN: [id number] DOB: Mar 22, 1965    ADMISSION DATE:  05/23/2017 CONSULTATION DATE:  5/16  REFERRING MD:   Purohit  CHIEF COMPLAINT:  DTs  HISTORY OF PRESENT ILLNESS:   52 year old male pt w/ known h/o ETOH abuse. Admitted Jan 2019 w/ DTs at that point he had switched from 1/2 gallon of vodka daily to 1/2 bottle of wine/day. Admitted on 5/15 after calling EMS w/ cc: abd pain. Confused on presentation. Reported no ETOH for 2d (but ETOH level was 177) On arrival to ER HR 135. CT abd completed for evaluation of abd pain showing diffuse fatty liver infiltration a single layering gallstone but no evidence of cholecystitis. He received total of  lorazepam over 5 hour period but remained tachycardic and tremulous because of this he was placed on precedex gtt and admitted to the SDU setting. PCCM asked to eval.   PAST MEDICAL HISTORY :  He  has a past medical history of Alcohol abuse and Depression.  PAST SURGICAL HISTORY: He  has a past surgical history that includes No past surgeries.  No Known Allergies  No current facility-administered medications on file prior to encounter.    Current Outpatient Medications on File Prior to Encounter  Medication Sig  . amLODipine (NORVASC) 5 MG tablet Take 1 tablet (5 mg total) daily by mouth.  . escitalopram (LEXAPRO) 10 MG tablet Take 1 tablet (10 mg total) by mouth daily.  . naltrexone (DEPADE) 50 MG tablet Take 1 tablet (50 mg total) by mouth daily.  Marland Kitchen omega-3 acid ethyl esters (LOVAZA) 1 g capsule Take 1 capsule (1 g total) by mouth 2 (two) times daily.    FAMILY HISTORY:  His indicated that his mother is deceased. He indicated that his father is alive. He indicated that the status of his neg hx is unknown.   SOCIAL HISTORY: He  reports that he has never smoked. He has never used smokeless tobacco. He reports that he drinks alcohol. He reports that he does not use drugs.  REVIEW OF  SYSTEMS:   Review of Systems:   Bolds are positive  Constitutional: no weight loss, gain, night sweats, Fevers, chills, fatigue .  HEENT: no headaches, Sore throat, sneezing, nasal congestion, post nasal drip, Difficulty swallowing, Tooth/dental problems, visual complaints visual changes, ear ache CV:  No chest pain, radiates:,Orthopnea, PND, swelling in lower extremities, dizziness, palpitations, syncope.  GI  heartburn, indigestion, abdominal pain involving RLQ, nausea, vomiting, diarrhea, change in bowel habits, loss of appetite, bloody stools.  Resp: no  cough , hemoptysis, dyspnea, chest pain, pleuritic.  Skin: rash or itching or icterus GU: dysuria, change in color of urine, urgency or frequency. flank pain, hematuria  MS: joint pain or swelling. decreased range of motion  Psych: change in mood or affect. depression or anxiety.  Neuro: difficulty with speech, weakness, numbness, ataxia    SUBJECTIVE:  Feels OK   VITAL SIGNS: Blood Pressure (Abnormal) 188/107   Pulse (Abnormal) 143   Temperature 98.5 F (36.9 C) (Oral)   Respiration 20   Height  (1.778 m)   Weight 170 lb (77.1 kg)   Oxygen Saturation 98%   Body Mass Index 24.39 kg/m   HEMODYNAMICS:    VENTILATOR SETTINGS:    INTAKE / OUTPUT: No intake/output data recorded.  PHYSICAL EXAMINATION: General: Pleasant 52 year old white male currently resting comfortably in bed no acute distress Neuro: Awake calm oriented x3 HEENT: Normocephalic atraumatic  no jugular venous distention mucous membranes moist Cardiovascular: Regular rate and rhythm Lungs: Clear to auscultation Abdomen: Soft, nontender but does have right lower quadrant discomfort Musculoskeletal: Equal strength and bulk Skin: Warm and dry  LABS:  BMET Recent Labs  Lab 05/23/17 0846  NA 137  K 4.5  CL 90*  CO2 18*  BUN 15  CREATININE 0.97  GLUCOSE 86    Electrolytes Recent Labs  Lab 05/23/17 0846  CALCIUM 9.1    CBC Recent  Labs  Lab 05/23/17 0846  WBC 12.6*  HGB 16.3  HCT 47.2  PLT 205    Coag's No results for input(s): APTT, INR in the last 168 hours.  Sepsis Markers No results for input(s): LATICACIDVEN, PROCALCITON, O2SATVEN in the last 168 hours.  ABG No results for input(s): PHART, PCO2ART, PO2ART in the last 168 hours.  Liver Enzymes Recent Labs  Lab 05/23/17 0846  AST 139*  ALT 113*  ALKPHOS 68  BILITOT 1.0  ALBUMIN 4.8    Cardiac Enzymes No results for input(s): TROPONINI, PROBNP in the last 168 hours.  Glucose No results for input(s): GLUCAP in the last 168 hours.  Imaging Ct Abdomen Pelvis W Contrast  Result Date: 05/23/2017 CLINICAL DATA:  Right side abdominal pain EXAM: CT ABDOMEN AND PELVIS WITH CONTRAST TECHNIQUE: Multidetector CT imaging of the abdomen and pelvis was performed using the standard protocol following bolus administration of intravenous contrast. CONTRAST:  ISOVUE-300 IOPAMIDOL (ISOVUE-300) INJECTION 61% COMPARISON:  None. FINDINGS: Lower chest: Lung bases are clear. No effusions. Heart is normal size. Hepatobiliary: Small layering gallstone within the gallbladder. Diffuse fatty infiltration of the liver. No biliary duct dilatation. Pancreas: No focal abnormality or ductal dilatation. Spleen: No focal abnormality.  Normal size. Adrenals/Urinary Tract: No adrenal abnormality. No focal renal abnormality. No stones or hydronephrosis. Urinary bladder is unremarkable. Stomach/Bowel: Normal appendix. Stomach, large and small bowel grossly unremarkable. Vascular/Lymphatic: Aortic atherosclerosis. No enlarged abdominal or pelvic lymph nodes. Reproductive: Mildly prominent prostate. Other: No free fluid or free air. Musculoskeletal: No acute bony abnormality. IMPRESSION: Severe diffuse fatty infiltration of the liver. Small single layering gallstone. No CT evidence for cholecystitis. No visible biliary stone or dilatation. Normal appendix. Mildly prominent prostate.  Scattered aortic calcifications. Electronically Signed   By: Charlett Nose M.D.   On: 05/23/2017 11:29     STUDIES:  CT abdomen 5/15: Severe diffuse fatty liver infiltrate.  Single layering gallstone without evidence of cholecystitis no visible biliary stone or dilation Abdominal ultrasound 5/16: CULTURES:   ANTIBIOTICS:   SIGNIFICANT EVENTS:   LINES/TUBES:   ASSESSMENT / PLAN: Acute encephalopathy  DTs ETOH abuse Leukocytosis  Abnormal LFTs Right lower quadrant abdominal pain Anion gap metabolic acidosis    Acute Encephalopathy in setting of DTs.  -placed on precedex and admitted to SDU on 5/15.  On 5/16 he is awake, oriented and calm.  He has not required additional therapies in addition to the Precedex he is only on 0.4 mcg Plan Transition to CIWA protocol  Continue supportive care Needs alcohol cessation  Right lower quadrant abdominal pain. -Has elevated LFTs but no evidence of biliary obstruction from lab work Plan Follow-up ultrasound  Fatty liver disease. Plan Needs alcohol cessation  History of depression Plan Continue antidepressant  Hypertension Plan Continue amlodipine.  Simonne Martinet ACNP-BC [facility] Pulmonary/Critical Care Pager # [phone] OR # [phone] if no answer

## 2017-05-23 NOTE — ED Provider Notes (Signed)
[facility]-EMERGENCY DEPT Provider Note   CSN: [id number] Arrival date & time: 05/23/17  4098     History   Chief Complaint Chief Complaint  Patient presents with  . Abdominal Pain    HPI Alex Logan is a 52 y.o. male.  Patient brought in by EMS.  Patient with complaint of abdominal pain right side more right upper quadrant patient noted to be very tremulous.  Patient states his last drink was yesterday.  Patient has had admission for DTs alcohol withdrawal recently.  EMS stated patient can stand but has been able to stand and ambulate here without any difficulties.     Past Medical History:  Diagnosis Date  . Alcohol abuse   . Depression     Patient Active Problem List   Diagnosis Date Noted  . Acute vomiting   . Pressure injury of skin 11/12/2016  . Alcohol withdrawal (HCC) 11/10/2016  . SIRS (systemic inflammatory response syndrome) (HCC) 11/10/2016  . High anion gap metabolic acidosis 11/10/2016  . Lactic acidosis 11/10/2016  . Hyponatremia 11/10/2016  . Starvation ketoacidosis 11/10/2016  . Alcohol use disorder, severe, dependence (HCC) 09/27/2016  . Alcohol abuse with alcohol-induced mood disorder (HCC) 09/22/2016    Past Surgical History:  Procedure Laterality Date  . NO PAST SURGERIES          Home Medications    Prior to Admission medications   Medication Sig Start Date End Date Taking? Authorizing Provider  amLODipine (NORVASC) 5 MG tablet Take 1 tablet (5 mg total) daily by mouth. 11/15/16  Yes Hall, Carole N, DO  escitalopram (LEXAPRO) 10 MG tablet Take 1 tablet (10 mg total) by mouth daily. 09/28/16  Yes Oneta Rack, NP  naltrexone (DEPADE) 50 MG tablet Take 1 tablet (50 mg total) by mouth daily. 09/28/16  Yes Oneta Rack, NP  omega-3 acid ethyl esters (LOVAZA) 1 g capsule Take 1 capsule (1 g total) by mouth 2 (two) times daily. 09/27/16  Yes Oneta Rack, NP    Family History Family History  Problem  Relation Age of Onset  . Emphysema Mother   . Bipolar disorder Father   . Alcohol abuse Father   . Mental illness Neg Hx     Social History Social History   Tobacco Use  . Smoking status: Never Smoker  . Smokeless tobacco: Never Used  Substance Use Topics  . Alcohol use: Yes    Comment: Chronic ETOH use and abuse  . Drug use: No     Allergies   Patient has no known allergies.   Review of Systems Review of Systems  Constitutional: Negative for fever.  HENT: Negative for congestion.   Eyes: Negative for visual disturbance.  Respiratory: Negative for shortness of breath.   Cardiovascular: Negative for chest pain.  Gastrointestinal: Positive for abdominal pain. Negative for nausea and vomiting.  Genitourinary: Negative for dysuria.  Musculoskeletal: Negative for back pain.  Neurological: Positive for tremors. Negative for syncope and light-headedness.  Hematological: Does not bruise/bleed easily.  Psychiatric/Behavioral: Negative for confusion.     Physical Exam Updated Vital Signs BP (!) 123/91   Pulse (!) 135   Temp 98.5 F (36.9 C) (Oral)   Resp 17   Ht 1.778 m ( )   Wt 77.1 kg (170 lb)   SpO2 97%   BMI 24.39 kg/m   Physical Exam  Constitutional: He is oriented to person, place, and time. He appears well-developed and well-nourished. He appears distressed.  HENT:  Head: Normocephalic and atraumatic.  Mucous membranes dry  Eyes: Pupils are equal, round, and reactive to light. EOM are normal.  Neck: Neck supple.  Cardiovascular: Regular rhythm and normal heart sounds.  Tachycardic  Pulmonary/Chest: Effort normal and breath sounds normal.  Abdominal: Soft. There is no tenderness.  Musculoskeletal: Normal range of motion. He exhibits no edema.  Neurological: He is alert and oriented to person, place, and time. No cranial nerve deficit or sensory deficit. He exhibits normal muscle tone. Coordination normal.  Skin: Skin is warm.  Nursing note and  vitals reviewed.    ED Treatments / Results  Labs (all labs ordered are listed, but only abnormal results are displayed) Labs Reviewed  COMPREHENSIVE METABOLIC PANEL - Abnormal; Notable for the following components:      Result Value   Chloride 90 (*)    CO2 18 (*)    Total Protein 8.2 (*)    AST 139 (*)    ALT 113 (*)    Anion gap 29 (*)    All other components within normal limits  CBC - Abnormal; Notable for the following components:   WBC 12.6 (*)    All other components within normal limits  URINALYSIS, ROUTINE W REFLEX MICROSCOPIC - Abnormal; Notable for the following components:   Hgb urine dipstick SMALL (*)    Ketones, ur 20 (*)    Protein, ur 100 (*)    Bacteria, UA RARE (*)    All other components within normal limits  ETHANOL - Abnormal; Notable for the following components:   Alcohol, Ethyl (B) 177 (*)    All other components within normal limits  LIPASE, BLOOD    EKG EKG Interpretation  Date/Time:  Wednesday May 23 2017 08:43:28 EDT Ventricular Rate:  144 PR Interval:    QRS Duration: 81 QT Interval:  285 QTC Calculation: 442 R Axis:   85 Text Interpretation:  Sinus tachycardia Ventricular premature complex Borderline low voltage, extremity leads Anteroseptal infarct, old Confirmed by Vanetta Mulders [phone]) on 05/23/2017 8:51:40 AM   Radiology Ct Abdomen Pelvis W Contrast  Result Date: 05/23/2017 CLINICAL DATA:  Right side abdominal pain EXAM: CT ABDOMEN AND PELVIS WITH CONTRAST TECHNIQUE: Multidetector CT imaging of the abdomen and pelvis was performed using the standard protocol following bolus administration of intravenous contrast. CONTRAST:  ISOVUE-300 IOPAMIDOL (ISOVUE-300) INJECTION 61% COMPARISON:  None. FINDINGS: Lower chest: Lung bases are clear. No effusions. Heart is normal size. Hepatobiliary: Small layering gallstone within the gallbladder. Diffuse fatty infiltration of the liver. No biliary duct dilatation. Pancreas: No focal  abnormality or ductal dilatation. Spleen: No focal abnormality.  Normal size. Adrenals/Urinary Tract: No adrenal abnormality. No focal renal abnormality. No stones or hydronephrosis. Urinary bladder is unremarkable. Stomach/Bowel: Normal appendix. Stomach, large and small bowel grossly unremarkable. Vascular/Lymphatic: Aortic atherosclerosis. No enlarged abdominal or pelvic lymph nodes. Reproductive: Mildly prominent prostate. Other: No free fluid or free air. Musculoskeletal: No acute bony abnormality. IMPRESSION: Severe diffuse fatty infiltration of the liver. Small single layering gallstone. No CT evidence for cholecystitis. No visible biliary stone or dilatation. Normal appendix. Mildly prominent prostate. Scattered aortic calcifications. Electronically Signed   By: Charlett Nose M.D.   On: 05/23/2017 11:29    Procedures Procedures (including critical care time)  CRITICAL CARE Performed by: Vanetta Mulders Total critical care time: 30 minutes Critical care time was exclusive of separately billable procedures and treating other patients. Critical care was necessary to treat or prevent imminent or life-threatening deterioration. Critical care  was time spent personally by me on the following activities: development of treatment plan with patient and/or surrogate as well as nursing, discussions with consultants, evaluation of patient's response to treatment, examination of patient, obtaining history from patient or surrogate, ordering and performing treatments and interventions, ordering and review of laboratory studies, ordering and review of radiographic studies, pulse oximetry and re-evaluation of patient's condition.   Medications Ordered in ED Medications  0.9 %  sodium chloride infusion ( Intravenous New Bag/Given 05/23/17 0901)  sodium chloride 0.9 % bolus 500 mL (0 mLs Intravenous Stopped 05/23/17 0949)  LORazepam (ATIVAN) injection 1 mg ( Intravenous Canceled Entry 05/23/17 1134)  sodium  chloride 0.9 % bolus 500 mL (0 mLs Intravenous Stopped 05/23/17 0949)  LORazepam (ATIVAN) injection 1 mg (1 mg Intravenous Given 05/23/17 0923)  iopamidol (ISOVUE-300) 61 % injection 100 mL ( Intravenous Canceled Entry 05/23/17 1127)  LORazepam (ATIVAN) injection 2 mg (2 mg Intravenous Given 05/23/17 1132)     Initial Impression / Assessment and Plan / ED Course  I have reviewed the triage vital signs and the nursing notes.  Pertinent labs & imaging results that were available during my care of the patient were reviewed by me and considered in my medical decision making (see chart for details).    Patient without any mental status change but very tachycardic.  Heart rate in the 140s.  Patient very tremulous.  Clinically dehydrated.  Patient received Ativan for DTs.  Patient received IV fluids.  Patient CT abdomen with finding of cholelithiasis with no signs of acute cholecystitis.  Possible his abdominal pain could be related to this.  However patient despite Ativan total of 4 mg and fluids is still tachycardic still tremulous will require admission for DTs.  Discussed with the hospitalist who are familiar with the patient and will admit.   Final Clinical Impressions(s) / ED Diagnoses   Final diagnoses:  ETOH abuse  Right upper quadrant abdominal pain  Calculus of gallbladder without cholecystitis without obstruction  Delirium tremens [facility])    ED Discharge Orders    None       Vanetta Mulders, MD 05/23/17 1559

## 2017-05-23 NOTE — H&P (Signed)
History and Physical    HELIODORO DOMAGALSKI ZOX:[id number] DOB: 28-Aug-1965 DOA: 05/23/2017  PCP: System, Pcp Not In (  Patient coming from: home  Chief Complaint: RLQ abdominal pain, EtOH withdrawal  HPI: Alex Logan is a 52 y.o. male with medical history significant of alcohol dependency, complicated withdrawals including DTs and possible seizures, hypertension, depression who comes in with abdominal pain and acute alcohol withdrawal.  Patient reports that he woke this morning with right lower quadrant pain.  He reported the pain was sharp and exacerbated by moving.  The pain improved when he lay still.  The pain does not change when he eats.  The pain does not radiate.  It is not waxing or waning.  He has not had any nausea, vomiting, diarrhea.  He denies any cough, congestion, rhinorrhea, abdominal surgeries, chest pain, syncope/presyncope, orthopnea.  He also reports drinking approximately 1 pint daily with binges of up to two 1 L bottles of wine.  His most recent binge was either yesterday or 2 days ago.  He cannot recall.  He currently is on oral naltrexone for his alcoholism.  His cage is positive.  Patient denies any other recreational substance use.   ED Course: In the ED patient was noted to be quite tachycardic with heart rate in the 120s to 140s.  His blood pressure was relatively elevated in the 180s.  Review of Systems: As per HPI otherwise 10 point review of systems negative.  His ethanol was 177.  His lipase was normal.  His AST and ALT were mildly elevated.  His white count was elevated 12.6.  CT of the abdomen pelvis showed only a layering gallstone and severe diffuse fatty infiltration of the liver.  Patient was placed on CIWA protocol given several doses of lorazepam.   Past Medical History:  Diagnosis Date  . Alcohol abuse   . Depression     Past Surgical History:  Procedure Laterality Date  . NO PAST SURGERIES       reports that he has never smoked. He has never  used smokeless tobacco. He reports that he drinks alcohol. He reports that he does not use drugs.  No Known Allergies  Family History  Problem Relation Age of Onset  . Emphysema Mother   . Bipolar disorder Father   . Alcohol abuse Father   . Mental illness Neg Hx    Unacceptable: Noncontributory, unremarkable, or negative. Acceptable: Family history reviewed and not pertinent (If you reviewed it)  Prior to Admission medications   Medication Sig Start Date End Date Taking? Authorizing Provider  amLODipine (NORVASC) 5 MG tablet Take 1 tablet (5 mg total) daily by mouth. 11/15/16  Yes Hall, Carole N, DO  escitalopram (LEXAPRO) 10 MG tablet Take 1 tablet (10 mg total) by mouth daily. 09/28/16  Yes Oneta Rack, NP  naltrexone (DEPADE) 50 MG tablet Take 1 tablet (50 mg total) by mouth daily. 09/28/16  Yes Oneta Rack, NP  omega-3 acid ethyl esters (LOVAZA) 1 g capsule Take 1 capsule (1 g total) by mouth 2 (two) times daily. 09/27/16  Yes Oneta Rack, NP    Physical Exam: Vitals:   05/23/17 1150 05/23/17 1155 05/23/17 1320 05/23/17 1400  BP:   (!) 146/74 (!) 188/107  Pulse: (!) 134 (!) 135 (!) 138 (!) 143  Resp:   15 20  Temp:      TempSrc:      SpO2:   96% 98%  Weight:  Height:        Constitutional: NAD, calm, comfortable Vitals:   05/23/17 1150 05/23/17 1155 05/23/17 1320 05/23/17 1400  BP:   (!) 146/74 (!) 188/107  Pulse: (!) 134 (!) 135 (!) 138 (!) 143  Resp:   15 20  Temp:      TempSrc:      SpO2:   96% 98%  Weight:      Height:       Eyes: Anicteric sclera ENMT: Mucous membranes Neck: normal, supple Respiratory: clear to auscultation bilaterally, no wheezing, no crackles. Normal respiratory effort. No accessory muscle use.  Cardiovascular: Tachycardia, bounding pulses, no murmur.  Abdomen: No tenderness, no right upper quadrant tenderness to palpation, no rebound or guarding, plus bowel sounds Musculoskeletal: no clubbing / cyanosis. No joint deformity  upper and lower extremities. Good ROM, no contractures. Normal muscle tone.  Skin: no rashes on visible skin Neurologic: Diffusely tremulous, alert and oriented Psychiatric: Poor judgment and insight. Alert and oriented x 3.  Depressed mood.     Labs on Admission: I have personally reviewed following labs and imaging studies  CBC: Recent Labs  Lab 05/23/17 0846  WBC 12.6*  HGB 16.3  HCT 47.2  MCV 91.1  PLT 205   Basic Metabolic Panel: Recent Labs  Lab 05/23/17 0846  NA 137  K 4.5  CL 90*  CO2 18*  GLUCOSE 86  BUN 15  CREATININE 0.97  CALCIUM 9.1   GFR: Estimated Creatinine Clearance: 94.1 mL/min (by C-G formula based on SCr of 0.97 mg/dL). Liver Function Tests: Recent Labs  Lab 05/23/17 0846  AST 139*  ALT 113*  ALKPHOS 68  BILITOT 1.0  PROT 8.2*  ALBUMIN 4.8   Recent Labs  Lab 05/23/17 0846  LIPASE 37   No results for input(s): AMMONIA in the last 168 hours. Coagulation Profile: No results for input(s): INR, PROTIME in the last 168 hours. Cardiac Enzymes: No results for input(s): CKTOTAL, CKMB, CKMBINDEX, TROPONINI in the last 168 hours. BNP (last 3 results) No results for input(s): PROBNP in the last 8760 hours. HbA1C: No results for input(s): HGBA1C in the last 72 hours. CBG: No results for input(s): GLUCAP in the last 168 hours. Lipid Profile: No results for input(s): CHOL, HDL, LDLCALC, TRIG, CHOLHDL, LDLDIRECT in the last 72 hours. Thyroid Function Tests: No results for input(s): TSH, T4TOTAL, FREET4, T3FREE, THYROIDAB in the last 72 hours. Anemia Panel: No results for input(s): VITAMINB12, FOLATE, FERRITIN, TIBC, IRON, RETICCTPCT in the last 72 hours. Urine analysis:    Component Value Date/Time   COLORURINE YELLOW 05/23/2017 1028   APPEARANCEUR CLEAR 05/23/2017 1028   LABSPEC 1.021 05/23/2017 1028   PHURINE 5.0 05/23/2017 1028   GLUCOSEU NEGATIVE 05/23/2017 1028   HGBUR SMALL (A) 05/23/2017 1028   BILIRUBINUR NEGATIVE 05/23/2017 1028    KETONESUR 20 (A) 05/23/2017 1028   PROTEINUR 100 (A) 05/23/2017 1028   UROBILINOGEN 0.2 12/06/2012 1725   NITRITE NEGATIVE 05/23/2017 1028   LEUKOCYTESUR NEGATIVE 05/23/2017 1028    Radiological Exams on Admission: Ct Abdomen Pelvis W Contrast  Result Date: 05/23/2017 CLINICAL DATA:  Right side abdominal pain EXAM: CT ABDOMEN AND PELVIS WITH CONTRAST TECHNIQUE: Multidetector CT imaging of the abdomen and pelvis was performed using the standard protocol following bolus administration of intravenous contrast. CONTRAST:  ISOVUE-300 IOPAMIDOL (ISOVUE-300) INJECTION 61% COMPARISON:  None. FINDINGS: Lower chest: Lung bases are clear. No effusions. Heart is normal size. Hepatobiliary: Small layering gallstone within the gallbladder. Diffuse fatty infiltration  of the liver. No biliary duct dilatation. Pancreas: No focal abnormality or ductal dilatation. Spleen: No focal abnormality.  Normal size. Adrenals/Urinary Tract: No adrenal abnormality. No focal renal abnormality. No stones or hydronephrosis. Urinary bladder is unremarkable. Stomach/Bowel: Normal appendix. Stomach, large and small bowel grossly unremarkable. Vascular/Lymphatic: Aortic atherosclerosis. No enlarged abdominal or pelvic lymph nodes. Reproductive: Mildly prominent prostate. Other: No free fluid or free air. Musculoskeletal: No acute bony abnormality. IMPRESSION: Severe diffuse fatty infiltration of the liver. Small single layering gallstone. No CT evidence for cholecystitis. No visible biliary stone or dilatation. Normal appendix. Mildly prominent prostate. Scattered aortic calcifications. Electronically Signed   By: Charlett Nose M.D.   On: 05/23/2017 11:29    EKG: Independently reviewed.  Sinus tachycardia, no acute ST segment changes, LVH  Assessment/Plan Active Problems:   Alcohol use disorder, severe, dependence (HCC)   Alcohol withdrawal (HCC)   HTN (hypertension)   #) Acute alcohol withdrawal, complicated history of  DTs and seizures: Patient denies any other recreational substance use however will check and evaluate for any coingestions.  Have discussed with PCCM and they have agreed that dexmedetomidine would be a good regimen for him along with scheduled lorazepam and CIWA protocol - IV banana bag - Thiamine and folate - IV fluids - Scheduled lorazepam 2 mg p.o. every 6 hours and CIWA protocol -Dexmedetomidine infusion - Hold naltrexone due to elevated LFTs, will discuss with patient about either restarting naltrexone or possibly transitioning to gabapentin or baclofen for cravings  #) Abdominal pain: Pain appears to be primarily right lower quadrant and exacerbated when moving that leg.  Have low suspicion for this being biliary pain.  Suspect that his elevated LFTs are likely secondary to his significant alcoholic liver disease.  He does not have a particularly concerning abdominal exam at this time.  He does however have one layering gallstone noted on CT.  - Abdominal ultrasound right upper quadrant  #) Elevated LFTs: Secondary to diffuse fatty infiltration secondary to his significant alcoholism.  -encourage cessation, counseling provided -Hold naltrexone  #) Hypertension: -Continue amlodipine 5 mg  #) Depression: -Continue esCitalopram 10 mg  Fluids: IV fluids Electrodes: Monitor and supplement Nutrition: Regular diet  Prophylaxis: Enoxaparin  Disposition: Pending resolution of withdrawals  Full code    Delaine Lame MD [facility]  If 7PM-7AM, please contact night-coverage [email or web address] Password [facility]  05/23/2017, 2:48 PM

## 2017-05-24 ENCOUNTER — Inpatient Hospital Stay (HOSPITAL_COMMUNITY): Payer: Self-pay

## 2017-05-24 DIAGNOSIS — I10 Essential (primary) hypertension: Secondary | ICD-10-CM

## 2017-05-24 DIAGNOSIS — F10231 Alcohol dependence with withdrawal delirium: Principal | ICD-10-CM

## 2017-05-24 DIAGNOSIS — F1020 Alcohol dependence, uncomplicated: Secondary | ICD-10-CM | POA: Diagnosis not present

## 2017-05-24 DIAGNOSIS — F1010 Alcohol abuse, uncomplicated: Secondary | ICD-10-CM | POA: Diagnosis not present

## 2017-05-24 DIAGNOSIS — R109 Unspecified abdominal pain: Secondary | ICD-10-CM

## 2017-05-24 DIAGNOSIS — F10230 Alcohol dependence with withdrawal, uncomplicated: Secondary | ICD-10-CM | POA: Diagnosis not present

## 2017-05-24 DIAGNOSIS — I471 Supraventricular tachycardia: Secondary | ICD-10-CM | POA: Diagnosis not present

## 2017-05-24 DIAGNOSIS — K8020 Calculus of gallbladder without cholecystitis without obstruction: Secondary | ICD-10-CM | POA: Diagnosis not present

## 2017-05-24 DIAGNOSIS — F418 Other specified anxiety disorders: Secondary | ICD-10-CM | POA: Diagnosis not present

## 2017-05-24 DIAGNOSIS — K700 Alcoholic fatty liver: Secondary | ICD-10-CM | POA: Diagnosis not present

## 2017-05-24 DIAGNOSIS — D61818 Other pancytopenia: Secondary | ICD-10-CM | POA: Diagnosis not present

## 2017-05-24 DIAGNOSIS — R945 Abnormal results of liver function studies: Secondary | ICD-10-CM | POA: Diagnosis not present

## 2017-05-24 LAB — COMPREHENSIVE METABOLIC PANEL
ALBUMIN: 3.6 g/dL (ref 3.5–5.0)
ALT: 82 U/L — ABNORMAL HIGH (ref 17–63)
ALT: 102 U/L — ABNORMAL HIGH (ref 17–63)
AST: 137 U/L — AB (ref 15–41)
Alkaline Phosphatase: 48 U/L (ref 38–126)
Alkaline Phosphatase: 60 U/L (ref 38–126)
Anion gap: 11 (ref 5–15)
BUN: 14 mg/dL (ref 6–20)
CO2: 24 mmol/L (ref 22–32)
CO2: 25 mmol/L (ref 22–32)
Calcium: 8.4 mg/dL — ABNORMAL LOW (ref 8.9–10.3)
Calcium: 8.9 mg/dL (ref 8.9–10.3)
Chloride: 102 mmol/L (ref 101–111)
Creatinine, Ser: 0.8 mg/dL (ref 0.61–1.24)
Creatinine, Ser: 1.03 mg/dL (ref 0.61–1.24)
GFR calc Af Amer: >60 mL/min (ref >60)
GFR calc Af Amer: >60 mL/min (ref >60)
GFR calc non Af Amer: >60 mL/min (ref >60)
GLUCOSE: 131 mg/dL — AB (ref 65–99)
POTASSIUM: 3.6 mmol/L (ref 3.5–5.1)
SODIUM: 138 mmol/L (ref 135–145)
Sodium: 137 mmol/L (ref 135–145)
TOTAL PROTEIN: 6.5 g/dL (ref 6.5–8.1)
Total Bilirubin: 1.6 mg/dL — ABNORMAL HIGH (ref 0.3–1.2)
Total Bilirubin: 1.6 mg/dL — ABNORMAL HIGH (ref 0.3–1.2)
Total Protein: 6.1 g/dL — ABNORMAL LOW (ref 6.5–8.1)

## 2017-05-24 LAB — CBC
HCT: 37.3 % — ABNORMAL LOW (ref 39.0–52.0)
Hemoglobin: 12.4 g/dL — ABNORMAL LOW (ref 13.0–17.0)
MCH: 30.6 pg (ref 26.0–34.0)
MCHC: 33.2 g/dL (ref 30.0–36.0)
MCV: 92.1 fL (ref 78.0–100.0)
Platelets: 104 K/uL — ABNORMAL LOW (ref 150–400)
RBC: 4.05 MIL/uL — ABNORMAL LOW (ref 4.22–5.81)
RDW: 13.3 % (ref 11.5–15.5)
WBC: 3.7 K/uL — ABNORMAL LOW (ref 4.0–10.5)

## 2017-05-24 LAB — COMPREHENSIVE METABOLIC PANEL WITH GFR
AST: 93 U/L — ABNORMAL HIGH (ref 15–41)
Albumin: 3.3 g/dL — ABNORMAL LOW (ref 3.5–5.0)
Anion gap: 8 (ref 5–15)
BUN: 15 mg/dL (ref 6–20)
Chloride: 105 mmol/L (ref 101–111)
GFR calc non Af Amer: >60 mL/min (ref >60)
Glucose, Bld: 118 mg/dL — ABNORMAL HIGH (ref 65–99)
Potassium: 3.4 mmol/L — ABNORMAL LOW (ref 3.5–5.1)

## 2017-05-24 LAB — PROTIME-INR
INR: 0.97
Prothrombin Time: 12.8 s (ref 11.4–15.2)

## 2017-05-24 MED ORDER — DICLOFENAC SODIUM 1 % TD GEL
2.0000 g | Freq: Four times a day (QID) | TRANSDERMAL | Status: DC
  Administered 2017-05-24 – 2017-05-27 (×12): 2 g via TOPICAL
  Filled 2017-05-24: qty 100

## 2017-05-24 MED ORDER — VITAMIN B-1 100 MG PO TABS
100.0000 mg | ORAL_TABLET | Freq: Every day | ORAL | Status: DC
  Administered 2017-05-25 – 2017-05-27 (×3): 100 mg via ORAL
  Filled 2017-05-24 (×3): qty 1

## 2017-05-24 MED ORDER — POTASSIUM CHLORIDE CRYS ER 20 MEQ PO TBCR
40.0000 meq | EXTENDED_RELEASE_TABLET | ORAL | Status: AC
  Administered 2017-05-24 (×2): 40 meq via ORAL
  Filled 2017-05-24 (×2): qty 2

## 2017-05-24 MED ORDER — CLONIDINE HCL 0.1 MG PO TABS
0.1000 mg | ORAL_TABLET | Freq: Three times a day (TID) | ORAL | Status: DC | PRN
  Administered 2017-05-24: 0.1 mg via ORAL
  Filled 2017-05-24: qty 1

## 2017-05-24 NOTE — Progress Notes (Signed)
Pt. BP consistently high. 189/101, 179/114. MD paged to ask for PRN's. Awaiting orders.

## 2017-05-24 NOTE — Progress Notes (Signed)
PROGRESS NOTE    Alex Logan   ZOX:[id number]  DOB: September 19, 1965  DOA: 05/23/2017 [facility]CP: [facility]   Brief Narrative:  Alex Logan 52 y/o male with long term alcohol abuse and HTN who presents with RUQ abdominal pain and is found to have alcohol withdrawal.  He awoke with the pain and noted it was worse with movement and improved when he lay still. He had no nausea, vomiting or diarrhea.  He was noted to be tachycardiac in the ED with an SBP in 180s and tremors. He stated he drinks about 2 bottles of wine a day and last drank maybe 1-2 days ago (he could not remember).  He was given numerous doses of Ativan and then started on a Precedex infusion.  CT showed a fatty liver and a single gallstone without cholecystitis or duct obstruction.    Subjective: Has pain in right side of his chest and upper flank which is worse when he moves around.  ROS: no complaints of nausea, vomiting, constipation diarrhea, cough, dyspnea or dysuria. No other complaints.    Assessment & Plan:   Active Problems:   Alcohol use disorder, severe, dependence (HCC)   Alcohol withdrawal - has stabilized with Precedex- he states he quit for about 2 months earlier this year but started drinking again a few wks ago -  Cont Precedex - social work consulted to give resources to help quit- he states he has never gone to [facility] [employer] or inpatient rehab - he takes Naltrexone and is asking for something stronger to control his desire to drink- it can be increased to 100 which can be done when his withdrawal has resolved - cont Thiamine and Folic acid  Elevated LFTs - liked due to alcohol abuse and improving  Hypokalemia - replace- Mg normal  Fatty liver - discussed with patient that this will slowly become cirrhosis if he is not able to stop drinking    HTN (hypertension) - on Norvasc 5 mg daily -continue    Right lateral abdominal pain - this is muscular- occurs when he lift both his leg off  the bed when laying and when he tries to sit up- start Voltaren gel- cont PRN Motrin - d/c ultrasound of the abdomen- CT scan has been reviewed and is mentioned below  Pancytopenia - likely due to ETOH abuse- follow  Depression/anxiety - Lexapro   DVT prophylaxis: Lovenox Code Status: Full code Family Communication:  Disposition Plan: home when withdrawal resolves Consultants:   none Procedures:   none Antimicrobials:  Anti-infectives (From admission, onward)   None       Objective: Vitals:   05/24/17 1100 05/24/17 1125 05/24/17 1200 05/24/17 1300  BP: (!) 169/99  (!) 159/100 (!) 169/114  Pulse: 95  100 (!) 107  Resp: 19  18 (!) 28  Temp:  97.8 F (36.6 C)    TempSrc:  Oral    SpO2: 98%  100% 99%  Weight:      Height:        Intake/Output Summary (Last 24 hours) at 05/24/2017 1332 Last data filed at 05/24/2017 1200 Gross per 24 hour  Intake 3175.37 ml  Output 750 ml  Net 2425.37 ml   Filed Weights   05/23/17 0818 05/23/17 1650  Weight: 77.1 kg (170 lb) 69.5 kg (153 lb 3.5 oz)    Examination: General exam: Appears comfortable  HEENT: PERRLA, oral mucosa moist, no sclera icterus or thrush Respiratory system: Clear to auscultation. Respiratory effort normal.  Cardiovascular system: S1 & S2 heard, RRR.   Gastrointestinal system: Abdomen soft,  Tender along the right lateral part of upper abdomen, nondistended. Normal bowel sound. No organomegaly Central nervous system: Alert and oriented. No focal neurological deficits. Extremities: No cyanosis, clubbing or edema Skin: No rashes or ulcers Psychiatry:  Mood & affect appropriate.     Data Reviewed: I have personally reviewed following labs and imaging studies  CBC: Recent Labs  Lab 05/23/17 0846 05/24/17 0748  WBC 12.6* 3.7*  HGB 16.3 12.4*  HCT 47.2 37.3*  MCV 91.1 92.1  PLT 205 104*   Basic Metabolic Panel: Recent Labs  Lab 05/23/17 0846 05/23/17 1805 05/24/17 0320 05/24/17 0748  NA 137   --  QUESTIONABLE RESULTS, RECOMMEND RECOLLECT TO VERIFY 137  K 4.5  --  QUESTIONABLE RESULTS, RECOMMEND RECOLLECT TO VERIFY 3.4*  CL 90*  --  QUESTIONABLE RESULTS, RECOMMEND RECOLLECT TO VERIFY 105  CO2 18*  --  QUESTIONABLE RESULTS, RECOMMEND RECOLLECT TO VERIFY 24  GLUCOSE 86  --  QUESTIONABLE RESULTS, RECOMMEND RECOLLECT TO VERIFY 118*  BUN 15  --  QUESTIONABLE RESULTS, RECOMMEND RECOLLECT TO VERIFY 15  CREATININE 0.97  --  QUESTIONABLE RESULTS, RECOMMEND RECOLLECT TO VERIFY 0.80  CALCIUM 9.1  --  QUESTIONABLE RESULTS, RECOMMEND RECOLLECT TO VERIFY 8.4*  MG  --  1.7  --   --   PHOS  --  2.1*  --   --    GFR: Estimated Creatinine Clearance: 108.6 mL/min (by C-G formula based on SCr of 0.8 mg/dL). Liver Function Tests: Recent Labs  Lab 05/23/17 0846 05/24/17 0320 05/24/17 0748  AST 139* QUESTIONABLE RESULTS, RECOMMEND RECOLLECT TO VERIFY 93*  ALT 113* QUESTIONABLE RESULTS, RECOMMEND RECOLLECT TO VERIFY 82*  ALKPHOS 68 QUESTIONABLE RESULTS, RECOMMEND RECOLLECT TO VERIFY 48  BILITOT 1.0 QUESTIONABLE RESULTS, RECOMMEND RECOLLECT TO VERIFY 1.6*  PROT 8.2* QUESTIONABLE RESULTS, RECOMMEND RECOLLECT TO VERIFY 6.1*  ALBUMIN 4.8 QUESTIONABLE RESULTS, RECOMMEND RECOLLECT TO VERIFY 3.3*   Recent Labs  Lab 05/23/17 0846  LIPASE 37   No results for input(s): AMMONIA in the last 168 hours. Coagulation Profile: Recent Labs  Lab 05/24/17 0320 05/24/17 0748  INR QUESTIONABLE RESULTS, RECOMMEND RECOLLECT TO VERIFY 0.97   Cardiac Enzymes: No results for input(s): CKTOTAL, CKMB, CKMBINDEX, TROPONINI in the last 168 hours. BNP (last 3 results) No results for input(s): PROBNP in the last 8760 hours. HbA1C: No results for input(s): HGBA1C in the last 72 hours. CBG: No results for input(s): GLUCAP in the last 168 hours. Lipid Profile: No results for input(s): CHOL, HDL, LDLCALC, TRIG, CHOLHDL, LDLDIRECT in the last 72 hours. Thyroid Function Tests: No results for input(s): TSH, T4TOTAL,  FREET4, T3FREE, THYROIDAB in the last 72 hours. Anemia Panel: No results for input(s): VITAMINB12, FOLATE, FERRITIN, TIBC, IRON, RETICCTPCT in the last 72 hours. Urine analysis:    Component Value Date/Time   COLORURINE YELLOW 05/23/2017 1028   APPEARANCEUR CLEAR 05/23/2017 1028   LABSPEC 1.021 05/23/2017 1028   PHURINE 5.0 05/23/2017 1028   GLUCOSEU NEGATIVE 05/23/2017 1028   HGBUR SMALL (A) 05/23/2017 1028   BILIRUBINUR NEGATIVE 05/23/2017 1028   KETONESUR 20 (A) 05/23/2017 1028   PROTEINUR 100 (A) 05/23/2017 1028   UROBILINOGEN 0.2 12/06/2012 1725   NITRITE NEGATIVE 05/23/2017 1028   LEUKOCYTESUR NEGATIVE 05/23/2017 1028   Sepsis Labs: (procalcitonin:4,lacticidven:4) ) Recent Results (from the past 240 hour(s))  MRSA PCR Screening     Status: None   Collection Time: 05/23/17  4:40 PM  Result  Value Ref Range Status   MRSA by PCR NEGATIVE NEGATIVE Final    Comment:        The GeneXpert MRSA Assay (FDA approved for NASAL specimens only), is one component of a comprehensive MRSA colonization surveillance program. It is not intended to diagnose MRSA infection nor to guide or monitor treatment for MRSA infections. Performed at [facility], [address], [place], [place] [address]          Radiology Studies: Ct Abdomen Pelvis W Contrast  Result Date: 05/23/2017 CLINICAL DATA:  Right side abdominal pain EXAM: CT ABDOMEN AND PELVIS WITH CONTRAST TECHNIQUE: Multidetector CT imaging of the abdomen and pelvis was performed using the standard protocol following bolus administration of intravenous contrast. CONTRAST:  ISOVUE-300 IOPAMIDOL (ISOVUE-300) INJECTION 61% COMPARISON:  None. FINDINGS: Lower chest: Lung bases are clear. No effusions. Heart is normal size. Hepatobiliary: Small layering gallstone within the gallbladder. Diffuse fatty infiltration of the liver. No biliary duct dilatation. Pancreas: No focal abnormality or ductal  dilatation. Spleen: No focal abnormality.  Normal size. Adrenals/Urinary Tract: No adrenal abnormality. No focal renal abnormality. No stones or hydronephrosis. Urinary bladder is unremarkable. Stomach/Bowel: Normal appendix. Stomach, large and small bowel grossly unremarkable. Vascular/Lymphatic: Aortic atherosclerosis. No enlarged abdominal or pelvic lymph nodes. Reproductive: Mildly prominent prostate. Other: No free fluid or free air. Musculoskeletal: No acute bony abnormality. IMPRESSION: Severe diffuse fatty infiltration of the liver. Small single layering gallstone. No CT evidence for cholecystitis. No visible biliary stone or dilatation. Normal appendix. Mildly prominent prostate. Scattered aortic calcifications. Electronically Signed   By: Charlett Nose M.D.   On: 05/23/2017 11:29      Scheduled Meds: . amLODipine  5 mg Oral Daily  . diclofenac sodium  2 g Topical QID  . enoxaparin (LOVENOX) injection  40 mg Subcutaneous Q24H  . escitalopram  10 mg Oral Daily  . folic acid  1 mg Oral Daily  . multivitamin with minerals  1 tablet Oral Daily  . omega-3 acid ethyl esters  1 g Oral BID  . [START ON 05/25/2017] thiamine  100 mg Oral Daily   Continuous Infusions: . sodium chloride 100 mL/hr at 05/24/17 1200     LOS: 1 day    Time spent in minutes: 35     Calvert Cantor, MD [facility] Pager: [email or web address] Password [email or web address] 05/24/2017, 1:32 PM

## 2017-05-25 DIAGNOSIS — I10 Essential (primary) hypertension: Secondary | ICD-10-CM | POA: Diagnosis not present

## 2017-05-25 DIAGNOSIS — I471 Supraventricular tachycardia, unspecified: Secondary | ICD-10-CM

## 2017-05-25 DIAGNOSIS — F1020 Alcohol dependence, uncomplicated: Secondary | ICD-10-CM | POA: Diagnosis not present

## 2017-05-25 DIAGNOSIS — K8020 Calculus of gallbladder without cholecystitis without obstruction: Secondary | ICD-10-CM | POA: Diagnosis not present

## 2017-05-25 DIAGNOSIS — F10231 Alcohol dependence with withdrawal delirium: Secondary | ICD-10-CM | POA: Diagnosis not present

## 2017-05-25 LAB — CBC
HEMATOCRIT: 41.7 % (ref 39.0–52.0)
HEMOGLOBIN: 14.3 g/dL (ref 13.0–17.0)
MCH: 30.8 pg (ref 26.0–34.0)
MCHC: 34.3 g/dL (ref 30.0–36.0)
MCV: 89.9 fL (ref 78.0–100.0)
Platelets: 127 10*3/uL — ABNORMAL LOW (ref 150–400)
RBC: 4.64 MIL/uL (ref 4.22–5.81)
RDW: 12.9 % (ref 11.5–15.5)
WBC: 4.7 10*3/uL (ref 4.0–10.5)

## 2017-05-25 MED ORDER — LORAZEPAM 2 MG/ML IJ SOLN
4.0000 mg | Freq: Once | INTRAMUSCULAR | Status: AC
  Administered 2017-05-25: 4 mg via INTRAVENOUS
  Filled 2017-05-25: qty 2

## 2017-05-25 MED ORDER — POTASSIUM CHLORIDE CRYS ER 20 MEQ PO TBCR
40.0000 meq | EXTENDED_RELEASE_TABLET | Freq: Once | ORAL | Status: AC
  Administered 2017-05-25: 40 meq via ORAL
  Filled 2017-05-25: qty 2

## 2017-05-25 MED ORDER — METOPROLOL TARTRATE 25 MG PO TABS
25.0000 mg | ORAL_TABLET | Freq: Two times a day (BID) | ORAL | Status: DC
  Administered 2017-05-25 – 2017-05-26 (×3): 25 mg via ORAL
  Filled 2017-05-25 (×3): qty 1

## 2017-05-25 NOTE — Progress Notes (Signed)
PATIENT STOOD UP; REPORTED HE WAS GOING HOME. PATIENT REORIENTED; ASSISTED BACK INTO BED; EMOTIONAL SUPPORT GIVEN. PT REPORTED HIS EX-WIFE WAS TO VISIT AND HE DID NOT KNOW WHERE SHE WAS. PT WAS REMINDED THAT HE SPOKE WITH HER A FEW MINUTES AGO AND SHE LIVES AT LEAST 40 MINUTES AWAY. PATIENT AGREED WITH POC AND WAS GIVEN COFFEE PER REQUEST. TELE MONITOR CONTINUES. WCTCM.

## 2017-05-25 NOTE — Progress Notes (Signed)
PROGRESS NOTE    Alex Logan   ZOX:[id number]  DOB: 04-Feb-1965  DOA: 05/23/2017 PCP: [facility]   Brief Narrative:  Alex Logan 52 y/o male with long term alcohol abuse and HTN who presents with RUQ abdominal pain and is found to have alcohol withdrawal.  He awoke with the pain and noted it was worse with movement and improved when he lay still. He had no nausea, vomiting or diarrhea.  He was noted to be tachycardiac in the ED with an SBP in 180s and tremors. He stated he drinks about 2 bottles of wine a day and last drank maybe 1-2 days ago (he could not remember).  He was given numerous doses of Ativan and then started on a Precedex infusion.  CT showed a fatty liver and a single gallstone without cholecystitis or duct obstruction.    Subjective: No complaints of pain today. In significant withdrawal. Tells me he feels anxious, nauseated and hungry. Missed his girls.   Assessment & Plan:   Active Problems:   Alcohol use disorder, severe, dependence (HCC)   Alcohol withdrawal - has stabilized with Precedex- he states he quit for about 2 months earlier this year but started drinking again a few wks ago -   Precedex stopped yesterday afternoon by PCCM- he began exhibiting worsening signs of withdrawal subsequently- he has required about 20 mg of IV Ativan since around 5:30 yesterday evening. - social work consulted to give resources to help quit- he states he has never gone to [facility] or inpatient rehab - he takes Naltrexone and is asking for something stronger to control his desire to drink- it can be increased to 100 which can be done when his withdrawal has resolved - cont Thiamine and Folic acid  SVT - short run yesterdaywith HR ~ 160-  likely due to withdrawal- start Lopressor  Elevated LFTs - liked due to alcohol abuse and improving  Hypokalemia - replaced - will give a one time dose of 40 today-  Mg normal  Fatty liver - discussed with patient  that this will slowly become cirrhosis if he is not able to stop drinking    HTN (hypertension) - on Norvasc 5 mg daily -continue    Right lateral abdominal pain - this is muscular- occurs when he lift both his leg off the bed when laying and when he tries to sit up- - cont Voltaren gel, PRN Motrin or Toradol - d/c ultrasound of the abdomen- CT scan has been reviewed and is mentioned below  Pancytopenia - likely due to ETOH abuse- follow  Depression/anxiety - Lexapro   DVT prophylaxis: Lovenox Code Status: Full code Family Communication:  Disposition Plan: home when withdrawal resolves Consultants:   none Procedures:   none Antimicrobials:  Anti-infectives (From admission, onward)   None       Objective: Vitals:   05/25/17 0731 05/25/17 0811 05/25/17 0938 05/25/17 1003  BP: (!) 168/115 (!) 153/115 (!) 140/102 (!) 145/106  Pulse: (!) 102 (!) 101    Resp: (!) 21     Temp:  98.2 F (36.8 C)    TempSrc:  Oral    SpO2: 100%     Weight:      Height:        Intake/Output Summary (Last 24 hours) at 05/25/2017 1028 Last data filed at 05/25/2017 0957 Gross per 24 hour  Intake 3352 ml  Output 5350 ml  Net -1998 ml   Filed Weights   05/23/17  0818 05/23/17 1650  Weight: 77.1 kg (170 lb) 69.5 kg (153 lb 3.5 oz)    Examination: General exam: Appears comfortable  HEENT: PERRLA, oral mucosa moist, no sclera icterus or thrush Respiratory system: Clear to auscultation. Respiratory effort normal. Cardiovascular system: S1 & S2 heard, RRR. tachycardic  Gastrointestinal system: Abdomen soft, nondistended. Normal bowel sound. No organomegaly Central nervous system: Alert and oriented. No focal neurological deficits. Extremities: No cyanosis, clubbing or edema Skin: No rashes or ulcers Psychiatry:  Mood & affect appropriate.     Data Reviewed: I have personally reviewed following labs and imaging studies  CBC: Recent Labs  Lab 05/23/17 0846 05/24/17 0748  05/25/17 0323  WBC 12.6* 3.7* 4.7  HGB 16.3 12.4* 14.3  HCT 47.2 37.3* 41.7  MCV 91.1 92.1 89.9  PLT 205 104* 127*   Basic Metabolic Panel: Recent Labs  Lab 05/23/17 0846 05/23/17 1805 05/24/17 0320 05/24/17 0748 05/24/17 1430  NA 137  --  QUESTIONABLE RESULTS, RECOMMEND RECOLLECT TO VERIFY 137 138  K 4.5  --  QUESTIONABLE RESULTS, RECOMMEND RECOLLECT TO VERIFY 3.4* 3.6  CL 90*  --  QUESTIONABLE RESULTS, RECOMMEND RECOLLECT TO VERIFY 105 102  CO2 18*  --  QUESTIONABLE RESULTS, RECOMMEND RECOLLECT TO VERIFY 24 25  GLUCOSE 86  --  QUESTIONABLE RESULTS, RECOMMEND RECOLLECT TO VERIFY 118* 131*  BUN 15  --  QUESTIONABLE RESULTS, RECOMMEND RECOLLECT TO VERIFY 15 14  CREATININE 0.97  --  QUESTIONABLE RESULTS, RECOMMEND RECOLLECT TO VERIFY 0.80 1.03  CALCIUM 9.1  --  QUESTIONABLE RESULTS, RECOMMEND RECOLLECT TO VERIFY 8.4* 8.9  MG  --  1.7  --   --   --   PHOS  --  2.1*  --   --   --    GFR: Estimated Creatinine Clearance: 84.3 mL/min (by C-G formula based on SCr of 1.03 mg/dL). Liver Function Tests: Recent Labs  Lab 05/23/17 0846 05/24/17 0320 05/24/17 0748 05/24/17 1430  AST 139* QUESTIONABLE RESULTS, RECOMMEND RECOLLECT TO VERIFY 93* 137*  ALT 113* QUESTIONABLE RESULTS, RECOMMEND RECOLLECT TO VERIFY 82* 102*  ALKPHOS 68 QUESTIONABLE RESULTS, RECOMMEND RECOLLECT TO VERIFY 48 60  BILITOT 1.0 QUESTIONABLE RESULTS, RECOMMEND RECOLLECT TO VERIFY 1.6* 1.6*  PROT 8.2* QUESTIONABLE RESULTS, RECOMMEND RECOLLECT TO VERIFY 6.1* 6.5  ALBUMIN 4.8 QUESTIONABLE RESULTS, RECOMMEND RECOLLECT TO VERIFY 3.3* 3.6   Recent Labs  Lab 05/23/17 0846  LIPASE 37   No results for input(s): AMMONIA in the last 168 hours. Coagulation Profile: Recent Labs  Lab 05/24/17 0320 05/24/17 0748  INR QUESTIONABLE RESULTS, RECOMMEND RECOLLECT TO VERIFY 0.97   Cardiac Enzymes: No results for input(s): CKTOTAL, CKMB, CKMBINDEX, TROPONINI in the last 168 hours. BNP (last 3 results) No results for  input(s): PROBNP in the last 8760 hours. HbA1C: No results for input(s): HGBA1C in the last 72 hours. CBG: No results for input(s): GLUCAP in the last 168 hours. Lipid Profile: No results for input(s): CHOL, HDL, LDLCALC, TRIG, CHOLHDL, LDLDIRECT in the last 72 hours. Thyroid Function Tests: No results for input(s): TSH, T4TOTAL, FREET4, T3FREE, THYROIDAB in the last 72 hours. Anemia Panel: No results for input(s): VITAMINB12, FOLATE, FERRITIN, TIBC, IRON, RETICCTPCT in the last 72 hours. Urine analysis:    Component Value Date/Time   COLORURINE YELLOW 05/23/2017 1028   APPEARANCEUR CLEAR 05/23/2017 1028   LABSPEC 1.021 05/23/2017 1028   PHURINE 5.0 05/23/2017 1028   GLUCOSEU NEGATIVE 05/23/2017 1028   HGBUR SMALL (A) 05/23/2017 1028   BILIRUBINUR NEGATIVE 05/23/2017 1028  KETONESUR 20 (A) 05/23/2017 1028   PROTEINUR 100 (A) 05/23/2017 1028   UROBILINOGEN 0.2 12/06/2012 1725   NITRITE NEGATIVE 05/23/2017 1028   LEUKOCYTESUR NEGATIVE 05/23/2017 1028   Sepsis Labs: (procalcitonin:4,lacticidven:4) ) Recent Results (from the past 240 hour(s))  MRSA PCR Screening     Status: None   Collection Time: 05/23/17  4:40 PM  Result Value Ref Range Status   MRSA by PCR NEGATIVE NEGATIVE Final    Comment:        The GeneXpert MRSA Assay (FDA approved for NASAL specimens only), is one component of a comprehensive MRSA colonization surveillance program. It is not intended to diagnose MRSA infection nor to guide or monitor treatment for MRSA infections. Performed at [facility], [address], [place], [place] [address]          Radiology Studies: Ct Abdomen Pelvis W Contrast  Result Date: 05/23/2017 CLINICAL DATA:  Right side abdominal pain EXAM: CT ABDOMEN AND PELVIS WITH CONTRAST TECHNIQUE: Multidetector CT imaging of the abdomen and pelvis was performed using the standard protocol following bolus administration of intravenous contrast.  CONTRAST:  ISOVUE-300 IOPAMIDOL (ISOVUE-300) INJECTION 61% COMPARISON:  None. FINDINGS: Lower chest: Lung bases are clear. No effusions. Heart is normal size. Hepatobiliary: Small layering gallstone within the gallbladder. Diffuse fatty infiltration of the liver. No biliary duct dilatation. Pancreas: No focal abnormality or ductal dilatation. Spleen: No focal abnormality.  Normal size. Adrenals/Urinary Tract: No adrenal abnormality. No focal renal abnormality. No stones or hydronephrosis. Urinary bladder is unremarkable. Stomach/Bowel: Normal appendix. Stomach, large and small bowel grossly unremarkable. Vascular/Lymphatic: Aortic atherosclerosis. No enlarged abdominal or pelvic lymph nodes. Reproductive: Mildly prominent prostate. Other: No free fluid or free air. Musculoskeletal: No acute bony abnormality. IMPRESSION: Severe diffuse fatty infiltration of the liver. Small single layering gallstone. No CT evidence for cholecystitis. No visible biliary stone or dilatation. Normal appendix. Mildly prominent prostate. Scattered aortic calcifications. Electronically Signed   By: Charlett Nose M.D.   On: 05/23/2017 11:29      Scheduled Meds: . amLODipine  5 mg Oral Daily  . diclofenac sodium  2 g Topical QID  . enoxaparin (LOVENOX) injection  40 mg Subcutaneous Q24H  . escitalopram  10 mg Oral Daily  . folic acid  1 mg Oral Daily  . metoprolol tartrate  25 mg Oral BID  . multivitamin with minerals  1 tablet Oral Daily  . thiamine  100 mg Oral Daily   Continuous Infusions: . sodium chloride 100 mL/hr at 05/25/17 0600     LOS: 2 days    Time spent in minutes: 35     Calvert Cantor, MD [facility] Pager: [email or web address] Password [facility] 05/25/2017, 10:28 AM

## 2017-05-25 NOTE — Progress Notes (Addendum)
Patient extremely agitated. CIWA 26 after  IV ativan. Patient states he wants to call the law and leave the hospital AMA. Called security. Paged [facility]. Security currently in room. New order for one time  IV ativan and continue to monitor. B[facility]heimer stated to not hesitate to call for more ativan if needed.

## 2017-05-25 NOTE — Progress Notes (Signed)
ATIVAN REQUESTED IN [place] AS RN IS UNABLE TO CONTINUE TO WALK TO ICU AREA AND KEEP PATIENT SAFE. PHARMACY AGREED TO RE-SUPPLY.

## 2017-05-25 NOTE — Progress Notes (Signed)
PATIENT CONTINUES TO ATTEMPT TO GET OUT OF BED; CIWA SCORE 17. PT REPORTED HE WAS GOING HOME. PATIENT CALLED EX-WIFE AND TOLD HER HE WAS HOME. EX-WIFE ASKED TO SPEAK WITH RN; UPDATED GIVEN WITH ALLOWANCE FOR ALL QUESTIONS TO BE ASKED AND ANSWERED; AGREE WITH POC. PATIENT REORIENTED; SEE MAR; PT CONTINUES WITH CONFUSION.

## 2017-05-25 NOTE — Progress Notes (Signed)
Consult-ETOH   CSW attempted to see the patient for ETOH support. Per nurse the patient has been trying to get out of bed and wants to leave.   Patient is currently calm and resting.   CSW will follow up at a different time.   Vivi Barrack, Theresia Majors, MSW Clinical Social Worker  [phone] 05/25/2017  11:27 AM

## 2017-05-25 NOTE — Progress Notes (Signed)
Ex-wife updated via phone conversation. POC discussed with allowance for all questions to be asked and answered with permission of patient. Ex-wife reports she will be bring daughter to visit this evening around 2000. Sitter remains at bedside; no distress noted.

## 2017-05-25 NOTE — Progress Notes (Signed)
PT  Note  Patient Details Name: Alex Logan MRN: [id number] DOB: March 20, 1965   Cancelled Treatment:     PT order received but eval deferred.  RN advises pt up and IND in room and with no PT needs.  PT services will sign off. Please reorder if pt status changes.   [name] 05/25/2017, 12:04 PM

## 2017-05-26 DIAGNOSIS — R945 Abnormal results of liver function studies: Secondary | ICD-10-CM | POA: Diagnosis not present

## 2017-05-26 DIAGNOSIS — F10231 Alcohol dependence with withdrawal delirium: Secondary | ICD-10-CM | POA: Diagnosis not present

## 2017-05-26 DIAGNOSIS — K8020 Calculus of gallbladder without cholecystitis without obstruction: Secondary | ICD-10-CM | POA: Diagnosis not present

## 2017-05-26 DIAGNOSIS — F1020 Alcohol dependence, uncomplicated: Secondary | ICD-10-CM | POA: Diagnosis not present

## 2017-05-26 LAB — COMPREHENSIVE METABOLIC PANEL
ALK PHOS: 58 U/L (ref 38–126)
ALT: 182 U/L — ABNORMAL HIGH (ref 17–63)
ANION GAP: 8 (ref 5–15)
AST: 167 U/L — ABNORMAL HIGH (ref 15–41)
Albumin: 3.4 g/dL — ABNORMAL LOW (ref 3.5–5.0)
BILIRUBIN TOTAL: 0.9 mg/dL (ref 0.3–1.2)
BUN: 15 mg/dL (ref 6–20)
CALCIUM: 8.9 mg/dL (ref 8.9–10.3)
CO2: 25 mmol/L (ref 22–32)
Chloride: 102 mmol/L (ref 101–111)
Creatinine, Ser: 0.83 mg/dL (ref 0.61–1.24)
GLUCOSE: 95 mg/dL (ref 65–99)
Potassium: 4.4 mmol/L (ref 3.5–5.1)
Sodium: 135 mmol/L (ref 135–145)
TOTAL PROTEIN: 6.4 g/dL — AB (ref 6.5–8.1)

## 2017-05-26 LAB — BASIC METABOLIC PANEL
Anion gap: 10 (ref 5–15)
BUN: 18 mg/dL (ref 6–20)
CHLORIDE: 102 mmol/L (ref 101–111)
CO2: 25 mmol/L (ref 22–32)
CREATININE: 0.87 mg/dL (ref 0.61–1.24)
Calcium: 8.9 mg/dL (ref 8.9–10.3)
GFR calc Af Amer: >60 mL/min (ref >60)
GFR calc non Af Amer: >60 mL/min (ref >60)
GLUCOSE: 97 mg/dL (ref 65–99)
Potassium: 4.3 mmol/L (ref 3.5–5.1)
Sodium: 137 mmol/L (ref 135–145)

## 2017-05-26 NOTE — Progress Notes (Signed)
DR Butler Denmark AT BEDSIDE. POC DISCUSSED WITH PATIENT INCLUDING AMBULATION AND S/P D/C ETOH SUPPORT NEEDS WITH ALLOWANCE FOR ALL QUESTIONS TO BE ASKED AND ANSWERED; PT AGREES WITH POC.

## 2017-05-26 NOTE — Evaluation (Signed)
Physical Therapy Evaluation Patient Details Name: Alex Logan MRN: [id number] DOB: 1965-06-02 Today's Date: 05/26/2017   History of Present Illness  Pt admitted with RUQ pain and ETOH withdrawal  Clinical Impression  Pt admitted as above and presenting with functional mobility limitations 2* generalized weakness and ambulatory balance deficits.  Pt should progress to dc home - pt states 24/7 assist of family/friends as needed.    Follow Up Recommendations No PT follow up    Equipment Recommendations  None recommended by PT    Recommendations for Other Services       Precautions / Restrictions Precautions Precautions: Fall Restrictions Weight Bearing Restrictions: No      Mobility  Bed Mobility Overal bed mobility: Modified Independent             General bed mobility comments: Pt unassisted to EOB  Transfers Overall transfer level: Needs assistance Equipment used: None Transfers: Sit to/from Stand Sit to Stand: Min assist         General transfer comment: To bring wt up and fwd and to balance in initial standing  Ambulation/Gait Ambulation/Gait assistance: Min assist Ambulation Distance (Feet): 800 Feet Assistive device: Rolling walker (2 wheeled);1 person hand held assist Gait Pattern/deviations: Step-through pattern;Decreased step length - right;Decreased step length - left;Shuffle;Wide base of support Gait velocity: decr   General Gait Details: Pt ambulated 600' with RW with noted improvement in stabilty with incresaed distance walked.  Pt ambulated additional 200' with min HHA for balance  Stairs            Wheelchair Mobility    Modified Rankin (Stroke Patients Only)       Balance Overall balance assessment: Needs assistance Sitting-balance support: No upper extremity supported;Feet supported Sitting balance-Leahy Scale: Good     Standing balance support: No upper extremity supported Standing balance-Leahy Scale: Poor                                Pertinent Vitals/Pain Pain Assessment: No/denies pain    Home Living Family/patient expects to be discharged to:: Private residence Living Arrangements: Alone Available Help at Discharge: Family;Friend(s);Available PRN/intermittently Type of Home: House Home Access: Stairs to enter Entrance Stairs-Rails: None Entrance Stairs-Number of Steps: 1 Home Layout: Two level;1/2 bath on main level;Bed/bath upstairs Home Equipment: None      Prior Function Level of Independence: Independent         Comments: Worked as Paramedic        Extremity/Trunk Assessment   Upper Extremity Assessment Upper Extremity Assessment: Generalized weakness    Lower Extremity Assessment Lower Extremity Assessment: Generalized weakness    Cervical / Trunk Assessment Cervical / Trunk Assessment: Normal  Communication   Communication: No difficulties  Cognition Arousal/Alertness: Awake/alert Behavior During Therapy: Flat affect Overall Cognitive Status: No family/caregiver present to determine baseline cognitive functioning                                 General Comments: Intermittent slowed processing      General Comments      Exercises     Assessment/Plan    PT Assessment Patient needs continued PT services  PT Problem List Decreased strength;Decreased activity tolerance;Decreased balance;Decreased mobility;Decreased knowledge of use of DME       PT Treatment Interventions DME instruction;Gait training;Stair training;Functional mobility training;Therapeutic activities;Therapeutic exercise;Balance  training;Patient/family education    PT Goals (Current goals can be found in the Care Plan section)  Acute Rehab PT Goals Patient Stated Goal: Regain IND and stay sober PT Goal Formulation: With patient Time For Goal Achievement: 06/09/17 Potential to Achieve Goals: Good    Frequency Min 3X/week   Barriers to  discharge        Co-evaluation               AM-PAC PT "6 Clicks" Daily Activity  Outcome Measure Difficulty turning over in bed (including adjusting bedclothes, sheets and blankets)?: A Little Difficulty moving from lying on back to sitting on the side of the bed? : A Little Difficulty sitting down on and standing up from a chair with arms (e.g., wheelchair, bedside commode, etc,.)?: A Lot Help needed moving to and from a bed to chair (including a wheelchair)?: A Little Help needed walking in hospital room?: A Little Help needed climbing 3-5 steps with a railing? : A Little 6 Click Score: 17    End of Session Equipment Utilized During Treatment: Gait belt Activity Tolerance: Patient tolerated treatment well Patient left: in chair;with call bell/phone within reach;with chair alarm set Nurse Communication: Mobility status PT Visit Diagnosis: Unsteadiness on feet (R26.81);Muscle weakness (generalized) (M62.81);Difficulty in walking, not elsewhere classified (R26.2)    Time: 1610-9604 PT Time Calculation (min) (ACUTE ONLY): 37 min   Charges:   PT Evaluation $PT Eval Low Complexity: 1 Low PT Treatments $Gait Training: 8-22 mins   PT G Codes:        Pg [phone]   Alex Logan 05/26/2017, 3:26 PM

## 2017-05-26 NOTE — Progress Notes (Addendum)
PROGRESS NOTE    Alex Logan   ZOX:[id number]  DOB: 1965/11/19  DOA: 05/23/2017 PCP: [facility]   Brief Narrative:  Alex Logan 52 y/o male with long term alcohol abuse and HTN who presents with RUQ abdominal pain and is found to have alcohol withdrawal.  He awoke with the pain and noted it was worse with movement and improved when he lay still. He had no nausea, vomiting or diarrhea.  He was noted to be tachycardiac in the ED with an SBP in 180s and tremors. He stated he drinks about 2 bottles of wine a day and last drank maybe 1-2 days ago (he could not remember).  He was given numerous doses of Ativan and then started on a Precedex infusion.  CT showed a fatty liver and a single gallstone without cholecystitis or duct obstruction.    Subjective: Withdrawal symptoms have improved. No anxiety, tremors or nausea. Has a good appetite. Having pain in left chest/abdomen when he moves.  Assessment & Plan:   Active Problems:   Alcohol use disorder, severe, dependence (HCC)   Alcohol withdrawal - has stabilized with Precedex- he states he quit for about 2 months earlier this year but started drinking again a few wks ago -   Precedex stopped 5/16 afternoon by PCCM- he began exhibiting worsening signs of withdrawal subsequently- he has required about 20 mg of IV Ativan since around 5:30 - 8 AM - social work consulted to give resources to help quit- he states he has never gone to [employer] or inpatient rehab- he states now he will go to AA - he takes Naltrexone and is asking for something stronger to control his desire to drink- it can be increased to 100 which can be done when his withdrawal has resolved - cont Thiamine and Folic acid - 5/18- withdrawal resolving- No Ativan since before midnight last night. - increase ambulation- transfer to tele ( for SVT)  SVT - short run 5/16 with HR ~ 160-  likely due to withdrawal- started Lopressor- he subsequently had sinus  tach- this has resolved as of this AM - will d/c Lopressor and watch overnight for further arrhythmias - I do not feel he needs long term Lopressor as current episode was likely stress induced from withdrawal- will obtain an ECHO to ensure he does not have alochol induced cardiomyopathy  Elevated LFTs - liked due to alcohol abuse and improving  Hypokalemia - replaced - will give a one time dose of 40 today-  Mg normal  Fatty liver - discussed with patient that this will slowly become cirrhosis if he is not able to stop drinking    HTN (hypertension) - on Norvasc 5 mg daily -continue    Right lateral abdominal pain - this is muscular- occurs when he lift both his leg off the bed when laying and when he tries to sit up- - cont Voltaren gel, PRN Motrin or Toradol - d/c ultrasound of the abdomen- CT scan has been reviewed and is mentioned below  Pancytopenia - likely due to ETOH abuse- follow  Depression/anxiety - Lexapro   DVT prophylaxis: Lovenox Code Status: Full code Family Communication:  Disposition Plan: home when withdrawal resolves Consultants:   none Procedures:   none Antimicrobials:  Anti-infectives (From admission, onward)   None       Objective: Vitals:   05/26/17 0800 05/26/17 0900 05/26/17 0939 05/26/17 1000  BP: (!) 131/94 (!) 139/97 (!) 139/97 (!) 148/110  Pulse: 84  (!)  110 93  Resp: (!) 25 20  (!) 21  Temp: 98.1 F (36.7 C)     TempSrc: Oral     SpO2: 97% 98%  98%  Weight:      Height:        Intake/Output Summary (Last 24 hours) at 05/26/2017 1131 Last data filed at 05/26/2017 0825 Gross per 24 hour  Intake 2781.68 ml  Output 2275 ml  Net 506.68 ml   Filed Weights   05/23/17 0818 05/23/17 1650  Weight: 77.1 kg (170 lb) 69.5 kg (153 lb 3.5 oz)    Examination: General exam: Appears comfortable  HEENT: PERRLA, oral mucosa moist, no sclera icterus or thrush Respiratory system: Clear to auscultation. Respiratory effort  normal. Cardiovascular system: S1 & S2 heard, RRR  Gastrointestinal system: Abdomen soft, nondistended. Normal bowel sound. No organomegaly Central nervous system: Alert and oriented. No focal neurological deficits. Extremities: No cyanosis, clubbing or edema Skin: No rashes or ulcers Psychiatry:  Mood & affect appropriate.     Data Reviewed: I have personally reviewed following labs and imaging studies  CBC: Recent Labs  Lab 05/23/17 0846 05/24/17 0748 05/25/17 0323  WBC 12.6* 3.7* 4.7  HGB 16.3 12.4* 14.3  HCT 47.2 37.3* 41.7  MCV 91.1 92.1 89.9  PLT 205 104* 127*   Basic Metabolic Panel: Recent Labs  Lab 05/23/17 0846 05/23/17 1805 05/24/17 0320 05/24/17 0748 05/24/17 1430 05/26/17 0320  NA 137  --  QUESTIONABLE RESULTS, RECOMMEND RECOLLECT TO VERIFY 137 138 137  K 4.5  --  QUESTIONABLE RESULTS, RECOMMEND RECOLLECT TO VERIFY 3.4* 3.6 4.3  CL 90*  --  QUESTIONABLE RESULTS, RECOMMEND RECOLLECT TO VERIFY 105 102 102  CO2 18*  --  QUESTIONABLE RESULTS, RECOMMEND RECOLLECT TO VERIFY GLUCOSE 86  --  QUESTIONABLE RESULTS, RECOMMEND RECOLLECT TO VERIFY 118* 131* 97  BUN 15  --  QUESTIONABLE RESULTS, RECOMMEND RECOLLECT TO VERIFY CREATININE 0.97  --  QUESTIONABLE RESULTS, RECOMMEND RECOLLECT TO VERIFY 0.80 1.03 0.87  CALCIUM 9.1  --  QUESTIONABLE RESULTS, RECOMMEND RECOLLECT TO VERIFY 8.4* 8.9 8.9  MG  --  1.7  --   --   --   --   PHOS  --  2.1*  --   --   --   --    GFR: Estimated Creatinine Clearance: 99.9 mL/min (by C-G formula based on SCr of 0.87 mg/dL). Liver Function Tests: Recent Labs  Lab 05/23/17 0846 05/24/17 0320 05/24/17 0748 05/24/17 1430  AST 139* QUESTIONABLE RESULTS, RECOMMEND RECOLLECT TO VERIFY 93* 137*  ALT 113* QUESTIONABLE RESULTS, RECOMMEND RECOLLECT TO VERIFY 82* 102*  ALKPHOS 68 QUESTIONABLE RESULTS, RECOMMEND RECOLLECT TO VERIFY 48 60  BILITOT 1.0 QUESTIONABLE RESULTS, RECOMMEND RECOLLECT TO VERIFY 1.6* 1.6*  PROT 8.2*  QUESTIONABLE RESULTS, RECOMMEND RECOLLECT TO VERIFY 6.1* 6.5  ALBUMIN 4.8 QUESTIONABLE RESULTS, RECOMMEND RECOLLECT TO VERIFY 3.3* 3.6   Recent Labs  Lab 05/23/17 0846  LIPASE 37   No results for input(s): AMMONIA in the last 168 hours. Coagulation Profile: Recent Labs  Lab 05/24/17 0320 05/24/17 0748  INR QUESTIONABLE RESULTS, RECOMMEND RECOLLECT TO VERIFY 0.97   Cardiac Enzymes: No results for input(s): CKTOTAL, CKMB, CKMBINDEX, TROPONINI in the last 168 hours. BNP (last 3 results) No results for input(s): PROBNP in the last 8760 hours. HbA1C: No results for input(s): HGBA1C in the last 72 hours. CBG: No results for input(s): GLUCAP in the last 168 hours. Lipid Profile: No results for input(s):  CHOL, HDL, LDLCALC, TRIG, CHOLHDL, LDLDIRECT in the last 72 hours. Thyroid Function Tests: No results for input(s): TSH, T4TOTAL, FREET4, T3FREE, THYROIDAB in the last 72 hours. Anemia Panel: No results for input(s): VITAMINB12, FOLATE, FERRITIN, TIBC, IRON, RETICCTPCT in the last 72 hours. Urine analysis:    Component Value Date/Time   COLORURINE YELLOW 05/23/2017 1028   APPEARANCEUR CLEAR 05/23/2017 1028   LABSPEC 1.021 05/23/2017 1028   PHURINE 5.0 05/23/2017 1028   GLUCOSEU NEGATIVE 05/23/2017 1028   HGBUR SMALL (A) 05/23/2017 1028   BILIRUBINUR NEGATIVE 05/23/2017 1028   KETONESUR 20 (A) 05/23/2017 1028   PROTEINUR 100 (A) 05/23/2017 1028   UROBILINOGEN 0.2 12/06/2012 1725   NITRITE NEGATIVE 05/23/2017 1028   LEUKOCYTESUR NEGATIVE 05/23/2017 1028   Sepsis Labs: (procalcitonin:4,lacticidven:4) ) Recent Results (from the past 240 hour(s))  MRSA PCR Screening     Status: None   Collection Time: 05/23/17  4:40 PM  Result Value Ref Range Status   MRSA by PCR NEGATIVE NEGATIVE Final    Comment:        The GeneXpert MRSA Assay (FDA approved for NASAL specimens only), is one component of a comprehensive MRSA colonization surveillance program. It is  not intended to diagnose MRSA infection nor to guide or monitor treatment for MRSA infections. Performed at [facility], [address], [place], [place] [address]          Radiology Studies: No results found.    Scheduled Meds: . amLODipine  5 mg Oral Daily  . diclofenac sodium  2 g Topical QID  . enoxaparin (LOVENOX) injection  40 mg Subcutaneous Q24H  . escitalopram  10 mg Oral Daily  . folic acid  1 mg Oral Daily  . multivitamin with minerals  1 tablet Oral Daily  . thiamine  100 mg Oral Daily   Continuous Infusions: . sodium chloride 100 mL/hr at 05/26/17 0500     LOS: 3 days    Time spent in minutes: 35     Calvert Cantor, MD [facility] Pager: [email or web address] Password [facility] 05/26/2017, 11:31 AM

## 2017-05-26 NOTE — Progress Notes (Signed)
PATIENT TRANSPORTED TO ROOM 1440 ON MONITOR WITH TELE-MONITOR (TELE-MONITOR AWARE OF TRANSFER). PATIENTS BELONGINGS INCLUDING CELL PHONE AND CHARGER TRANSFERRED WITH THE PATIENT. 3 WEST RN AT BEDSIDE.

## 2017-05-27 ENCOUNTER — Inpatient Hospital Stay (HOSPITAL_COMMUNITY): Payer: Self-pay

## 2017-05-27 DIAGNOSIS — F1010 Alcohol abuse, uncomplicated: Secondary | ICD-10-CM | POA: Diagnosis not present

## 2017-05-27 DIAGNOSIS — F10230 Alcohol dependence with withdrawal, uncomplicated: Secondary | ICD-10-CM | POA: Diagnosis not present

## 2017-05-27 DIAGNOSIS — R109 Unspecified abdominal pain: Secondary | ICD-10-CM

## 2017-05-27 DIAGNOSIS — F1020 Alcohol dependence, uncomplicated: Secondary | ICD-10-CM | POA: Diagnosis not present

## 2017-05-27 DIAGNOSIS — M7918 Myalgia, other site: Secondary | ICD-10-CM

## 2017-05-27 DIAGNOSIS — K700 Alcoholic fatty liver: Secondary | ICD-10-CM

## 2017-05-27 DIAGNOSIS — I472 Ventricular tachycardia: Secondary | ICD-10-CM

## 2017-05-27 DIAGNOSIS — R945 Abnormal results of liver function studies: Secondary | ICD-10-CM

## 2017-05-27 DIAGNOSIS — K8020 Calculus of gallbladder without cholecystitis without obstruction: Secondary | ICD-10-CM | POA: Diagnosis not present

## 2017-05-27 DIAGNOSIS — F418 Other specified anxiety disorders: Secondary | ICD-10-CM

## 2017-05-27 DIAGNOSIS — R7989 Other specified abnormal findings of blood chemistry: Secondary | ICD-10-CM

## 2017-05-27 DIAGNOSIS — D61818 Other pancytopenia: Secondary | ICD-10-CM

## 2017-05-27 LAB — HEPATIC FUNCTION PANEL
ALBUMIN: 3.6 g/dL (ref 3.5–5.0)
ALK PHOS: 55 U/L (ref 38–126)
ALT: 201 U/L — AB (ref 17–63)
AST: 161 U/L — AB (ref 15–41)
Bilirubin, Direct: 0.2 mg/dL (ref 0.1–0.5)
Indirect Bilirubin: 0.5 mg/dL (ref 0.3–0.9)
Total Bilirubin: 0.7 mg/dL (ref 0.3–1.2)
Total Protein: 6.7 g/dL (ref 6.5–8.1)

## 2017-05-27 LAB — ECHOCARDIOGRAM COMPLETE
Height: 70 in
Weight: 2451.52 oz

## 2017-05-27 LAB — CBC
HCT: 40.8 % (ref 39.0–52.0)
Hemoglobin: 13.9 g/dL (ref 13.0–17.0)
MCH: 30.9 pg (ref 26.0–34.0)
MCHC: 34.1 g/dL (ref 30.0–36.0)
MCV: 90.7 fL (ref 78.0–100.0)
Platelets: 123 10*3/uL — ABNORMAL LOW (ref 150–400)
RBC: 4.5 MIL/uL (ref 4.22–5.81)
RDW: 12.5 % (ref 11.5–15.5)
WBC: 5.3 10*3/uL (ref 4.0–10.5)

## 2017-05-27 MED ORDER — ADULT MULTIVITAMIN W/MINERALS CH: 1.0000 | ORAL_TABLET | Freq: Every day | ORAL | 0 refills | Status: DC

## 2017-05-27 MED ORDER — DICLOFENAC SODIUM 1 % TD GEL: 2.0000 g | Freq: Four times a day (QID) | TRANSDERMAL | Status: DC

## 2017-05-27 MED ORDER — THIAMINE HCL 100 MG PO TABS: 100.0000 mg | ORAL_TABLET | Freq: Every day | ORAL | 0 refills | Status: DC

## 2017-05-27 MED ORDER — NALTREXONE HCL 50 MG PO TABS: 100.0000 mg | ORAL_TABLET | Freq: Every day | ORAL | 0 refills | Status: DC

## 2017-05-27 MED ORDER — FOLIC ACID 1 MG PO TABS: 1.0000 mg | ORAL_TABLET | Freq: Every day | ORAL | 0 refills | Status: DC

## 2017-05-27 MED ORDER — IBUPROFEN 600 MG PO TABS: 600.0000 mg | ORAL_TABLET | Freq: Four times a day (QID) | ORAL | 0 refills | Status: DC | PRN

## 2017-05-27 NOTE — Clinical Social Work Note (Signed)
Clinical Social Work Assessment  Patient Details  Name: Alex Logan MRN: [id number] Date of Birth: 03-Oct-1965  Date of referral:  05/27/17               Reason for consult:  Substance Use/ETOH Abuse                Permission sought to share information with:    Permission granted to share information::     Name::        Agency::     Relationship::     Contact Information:     Housing/Transportation Living arrangements for the past 2 months:  Single Family Home Source of Information:  Patient Patient Interpreter Needed:  None Criminal Activity/Legal Involvement Pertinent to Current Situation/Hospitalization:  No - Comment as needed Significant Relationships:  Friend, Adult Children Lives with:  Self Do you feel safe going back to the place where you live?  Yes Need for family participation in patient care:  No (Coment)  Care giving concerns:  Pt admitted from home where he resides alone in townhouse. Admitted for alcohol withdrawal- past admissions for same.    Social Worker assessment / plan:  CSW consulted to assess alcohol use treatment options. Met with pt at bedside- calm, cooperative, oriented. Reports having issues with alcohol on and off for past 13 years. Review of past assessments indicates pt has ben given treatment resources and referrals in the past as well, including a rehabilitation stay at [facility] earlier this year and inpatient detox/treatment for SI at [facility] in 2018. Pt reports he has not had outpatient follow up. Reports he has done AA meetings in the past but "I didn;t think they helped me because they are supposed to be a support group but all they talked about was how you're always going to be an alcoholic." Pt reports he is open to inpatient residential treatment programs however states "[facility] a nightmare," and hesitant to pursue residential again. CSW discussed other options such as IOP with him- states he would be more agreeable to that but  transportation is a barrier. States he does not live near a busline and would need to make sure his ex-wife (primary support person) could arrange to take him to program. 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 Looking for work now. Has applied for Medicaid. Plan: pt given both OP and residential treatment options in area which have funding for pts uninsured.  Employment status:  Unemployed Forensic scientist:  Self Pay (Medicaid Pending) PT Recommendations:  No Follow Up Information / Referral to community resources:  Residential Substance Abuse Treatment Options, Outpatient Substance Abuse Treatment Options  Patient/Family's Response to care:  Pt expressed appreciation  Patient/Family's Understanding of and Emotional Response to Diagnosis, Current Treatment, and Prognosis:  Pt's understanding of treatment received seemed fair. He did not discuss amount of etoh consumed and withdrawal experienced at length as he was preparing for DC. Focused on his negative experience at [facility] but receptive to other treatment facility options in area. Seemed somewhat guarded, as evidenced by brief answers to some questions but lengthy/descriptive ones to others (e.g., brought conversation back to experience at [facility] several times).  Emotionally pt was appropriate if somewhat flat. Did express that his ex-wife is his closest friend and is actively supportive of him seeking sobriety. Also states  his college-age children are supportive.  Emotional Assessment Appearance:  Appears stated age Attitude/Demeanor/Rapport:  Engaged Affect (typically observed):  Calm, Pleasant Orientation:  Oriented to Self, Oriented to Place, Oriented to  Time, Oriented to Situation Alcohol / Substance use:  Alcohol Use Psych involvement (Current and /or in the community):  No  (Comment)  Discharge Needs  Concerns to be addressed:  Substance Abuse Concerns Readmission within the last 30 days:  No Current discharge risk:  Substance Abuse Barriers to Discharge:  No Barriers Identified   Nila Nephew, LCSW 05/27/2017, 3:03 PM  [phone] weekend coverage

## 2017-05-27 NOTE — Progress Notes (Signed)
Patient discharged from unit via wheelchair to vehicle with family members. Discharge instruction, follow up care instructions and prescriptions given to patient. A/O, VSS at discharge.  Marisa Severin, RN

## 2017-05-27 NOTE — Discharge Summary (Addendum)
Physician Discharge Summary  Alex Logan WUJ:[id number] DOB: Feb 28, 1965 DOA: 05/23/2017  PCP: [facility]  Admit date: 05/23/2017 Discharge date: 05/27/2017  Admitted From: home Disposition:  home   Recommendations for Outpatient Follow-up:  1. F/u LFTs in wk 2. Continue to encourage alcohol cessation  Home Health:  none  Equipment/Devices:  none    Discharge Condition:  stable   CODE STATUS:  Full code   Consultations:  none    Discharge Diagnoses:  Active Problems:   Alcohol use disorder, severe, dependence (HCC)   Alcohol withdrawal (HCC)   HTN (hypertension)   Right lateral abdominal pain   SVT (supraventricular tachycardia) (HCC)    Subjective: Ambulating, eating, drinking well. No vomiting or diarrhea and no trouble with urinating.  Brief Summary: Alex Logan 52 y/o male with long term alcohol abuse and HTN who presents with RUQ abdominal pain and is found to have alcohol withdrawal.  He awoke with the pain and noted it was worse with movement and improved when he lay still. He had no nausea, vomiting or diarrhea.  He was noted to be tachycardiac in the ED with an SBP in 180s and tremors. He stated he drinks about 2 bottles of wine a day and last drank maybe 1-2 days ago (he could not remember).  He was given numerous doses of Ativan and then started on a Precedex infusion.  CT showed a fatty liver and a single gallstone without cholecystitis or duct obstruction.   Hospital Course:  Active Problems:   Alcohol use disorder, severe, dependence (HCC)   Alcohol withdrawal - has stabilized with Precedex- he states he quit for about 2 months earlier this year but started drinking again a few wks ago -   Precedex stopped 5/16 afternoon by PCCM- he began exhibiting worsening signs of withdrawal subsequently- he has required about 20 mg of IV Ativan since around 5:30 - 8 AM  - withdrawal resolved- No Ativan given in past 48 hrs.  - social work  consulted to give resources to help quit- he states he has never gone to [employer] or inpatient rehab- he states now he will go to AA - he takes Naltrexone and is asking for something stronger to control his desire to drink- - I have increased it to 100   - cont Thiamine, MVI and Folic acid  SVT - short run 5/16 with HR ~ 160-  likely due to withdrawal- started Lopressor- he subsequently had sinus tach- this has resolved as of this AM - will d/c Lopressor and watch overnight for further arrhythmias - I do not feel he needs long term Lopressor as current episode was likely stress induced from withdrawal- will obtain an ECHO to ensure he does not have alochol induced cardiomyopathy - 2 D ECHO noted below is only shows grade 1 d CHF  Elevated LFTs - liked due to alcohol abuse - T bili has improved but AST and ALT have not and have actually slightly increased- may be due to fatty liver - recommending these be rechecked by PCP in 1 wk  Fatty liver - discussed with patient that this will slowly become cirrhosis if he is not able to stop drinking  Hypokalemia  - replaced    HTN (hypertension) - on Norvasc 5 mg daily -continue    Muscular Right lateral abdominal pain - this is muscular- occurs when he lift both his leg off the bed when laying and when he tries to sit up- -  cont Voltaren gel, PRN Motrin or Toradol - d/c'd ultrasound of the abdomen- CT scan has been reviewed and is mentioned below  Pancytopenia - likely due to ETOH abuse-  WBC and Hb has improved- platelets improving but still low  Depression/anxiety - Lexapro     Discharge Exam: Vitals:   05/26/17 2008 05/27/17 0451  BP: 127/81 (!) 149/108  Pulse: (!) 113 88  Resp: 18 18  Temp: 98.5 F (36.9 C) 97.7 F (36.5 C)  SpO2: 100% 99%   Vitals:   05/26/17 1700 05/26/17 1733 05/26/17 2008 05/27/17 0451  BP: (!) 138/98 (!) 126/100 127/81 (!) 149/108  Pulse:  (!) 113 (!) 113 88  Resp:  Temp:  (!)  97.4 F (36.3 C) 98.5 F (36.9 C) 97.7 F (36.5 C)  TempSrc:  Oral Axillary Oral  SpO2: 100% 100% 100% 99%  Weight:      Height:        General: Pt is alert, awake, not in acute distress Cardiovascular: RRR, S1/S2 +, no rubs, no gallops Respiratory: CTA bilaterally, no wheezing, no rhonchi Abdominal: Soft, NT, ND, bowel sounds + Extremities: no edema, no cyanosis   Discharge Instructions  Discharge Instructions    Diet - low sodium heart healthy   Complete by:  As directed    Increase activity slowly   Complete by:  As directed      Allergies as of 05/27/2017   No Known Allergies     Medication List    TAKE these medications   amLODipine 5 MG tablet Commonly known as:  NORVASC Take 1 tablet (5 mg total) daily by mouth.   diclofenac sodium 1 % Gel Commonly known as:  VOLTAREN Apply 2 g topically 4 (four) times daily.   escitalopram 10 MG tablet Commonly known as:  LEXAPRO Take 1 tablet (10 mg total) by mouth daily.   folic acid 1 MG tablet Commonly known as:  FOLVITE Take 1 tablet (1 mg total) by mouth daily. Start taking on:  05/28/2017   ibuprofen 600 MG tablet Commonly known as:  ADVIL,MOTRIN Take 1 tablet (600 mg total) by mouth every 6 (six) hours as needed for moderate pain.   multivitamin with minerals Tabs tablet Take 1 tablet by mouth daily. Start taking on:  05/28/2017   naltrexone 50 MG tablet Commonly known as:  DEPADE Take 2 tablets (100 mg total) by mouth daily. What changed:  how much to take   omega-3 acid ethyl esters 1 g capsule Commonly known as:  LOVAZA Take 1 capsule (1 g total) by mouth 2 (two) times daily.   thiamine 100 MG tablet Take 1 tablet (100 mg total) by mouth daily. Start taking on:  05/28/2017       No Known Allergies   Procedures/Studies: 2 D ECHO Study Conclusions  - Left ventricle: The cavity size was normal. Wall thickness was   increased in a pattern of moderate LVH. Systolic function was   vigorous.  The estimated ejection fraction was in the range of 65%   to 70%. Wall motion was normal; there were no regional wall   motion abnormalities. Doppler parameters are consistent with   abnormal left ventricular relaxation (grade 1 diastolic   dysfunction).  Ct Abdomen Pelvis W Contrast  Result Date: 05/23/2017 CLINICAL DATA:  Right side abdominal pain EXAM: CT ABDOMEN AND PELVIS WITH CONTRAST TECHNIQUE: Multidetector CT imaging of the abdomen and pelvis was performed using the standard protocol following bolus administration  of intravenous contrast. CONTRAST:  ISOVUE-300 IOPAMIDOL (ISOVUE-300) INJECTION 61% COMPARISON:  None. FINDINGS: Lower chest: Lung bases are clear. No effusions. Heart is normal size. Hepatobiliary: Small layering gallstone within the gallbladder. Diffuse fatty infiltration of the liver. No biliary duct dilatation. Pancreas: No focal abnormality or ductal dilatation. Spleen: No focal abnormality.  Normal size. Adrenals/Urinary Tract: No adrenal abnormality. No focal renal abnormality. No stones or hydronephrosis. Urinary bladder is unremarkable. Stomach/Bowel: Normal appendix. Stomach, large and small bowel grossly unremarkable. Vascular/Lymphatic: Aortic atherosclerosis. No enlarged abdominal or pelvic lymph nodes. Reproductive: Mildly prominent prostate. Other: No free fluid or free air. Musculoskeletal: No acute bony abnormality. IMPRESSION: Severe diffuse fatty infiltration of the liver. Small single layering gallstone. No CT evidence for cholecystitis. No visible biliary stone or dilatation. Normal appendix. Mildly prominent prostate. Scattered aortic calcifications. Electronically Signed   By: Charlett Nose M.D.   On: 05/23/2017 11:29     The results of significant diagnostics from this hospitalization (including imaging, microbiology, ancillary and laboratory) are listed below for reference.     Microbiology: Recent Results (from the past 240 hour(s))  MRSA PCR  Screening     Status: None   Collection Time: 05/23/17  4:40 PM  Result Value Ref Range Status   MRSA by PCR NEGATIVE NEGATIVE Final    Comment:        The GeneXpert MRSA Assay (FDA approved for NASAL specimens only), is one component of a comprehensive MRSA colonization surveillance program. It is not intended to diagnose MRSA infection nor to guide or monitor treatment for MRSA infections. Performed at [facility], [address] [place], [place] [address]      Labs: BNP (last 3 results) No results for input(s): BNP in the last 8760 hours. Basic Metabolic Panel: Recent Labs  Lab 05/23/17 1805 05/24/17 0320 05/24/17 0748 05/24/17 1430 05/26/17 0320 05/26/17 1144  NA  --  QUESTIONABLE RESULTS, RECOMMEND RECOLLECT TO VERIFY 137 138 137 135  K  --  QUESTIONABLE RESULTS, RECOMMEND RECOLLECT TO VERIFY 3.4* 3.6 4.3 4.4  CL  --  QUESTIONABLE RESULTS, RECOMMEND RECOLLECT TO VERIFY 105 102 102 102  CO2  --  QUESTIONABLE RESULTS, RECOMMEND RECOLLECT TO VERIFY GLUCOSE  --  QUESTIONABLE RESULTS, RECOMMEND RECOLLECT TO VERIFY 118* 131* 97 95  BUN  --  QUESTIONABLE RESULTS, RECOMMEND RECOLLECT TO VERIFY CREATININE  --  QUESTIONABLE RESULTS, RECOMMEND RECOLLECT TO VERIFY 0.80 1.03 0.87 0.83  CALCIUM  --  QUESTIONABLE RESULTS, RECOMMEND RECOLLECT TO VERIFY 8.4* 8.9 8.9 8.9  MG 1.7  --   --   --   --   --   PHOS 2.1*  --   --   --   --   --    Liver Function Tests: Recent Labs  Lab 05/23/17 0846 05/24/17 0320 05/24/17 0748 05/24/17 1430 05/26/17 1144  AST 139* QUESTIONABLE RESULTS, RECOMMEND RECOLLECT TO VERIFY 93* 137* 167*  ALT 113* QUESTIONABLE RESULTS, RECOMMEND RECOLLECT TO VERIFY 82* 102* 182*  ALKPHOS 68 QUESTIONABLE RESULTS, RECOMMEND RECOLLECT TO VERIFY 48 60 58  BILITOT 1.0 QUESTIONABLE RESULTS, RECOMMEND RECOLLECT TO VERIFY 1.6* 1.6* 0.9  PROT 8.2* QUESTIONABLE RESULTS, RECOMMEND RECOLLECT TO VERIFY 6.1* 6.5 6.4*  ALBUMIN  4.8 QUESTIONABLE RESULTS, RECOMMEND RECOLLECT TO VERIFY 3.3* 3.6 3.4*   Recent Labs  Lab 05/23/17 0846  LIPASE 37   No results for input(s): AMMONIA in the last 168 hours. CBC: Recent Labs  Lab  05/23/17 0846 05/24/17 0748 05/25/17 0323 05/27/17 0441  WBC 12.6* 3.7* 4.7 5.3  HGB 16.3 12.4* 14.3 13.9  HCT 47.2 37.3* 41.7 40.8  MCV 91.1 92.1 89.9 90.7  PLT 205 104* 127* 123*   Cardiac Enzymes: No results for input(s): CKTOTAL, CKMB, CKMBINDEX, TROPONINI in the last 168 hours. BNP: Invalid input(s): POCBNP CBG: No results for input(s): GLUCAP in the last 168 hours. D-Dimer No results for input(s): DDIMER in the last 72 hours. Hgb A1c No results for input(s): HGBA1C in the last 72 hours. Lipid Profile No results for input(s): CHOL, HDL, LDLCALC, TRIG, CHOLHDL, LDLDIRECT in the last 72 hours. Thyroid function studies No results for input(s): TSH, T4TOTAL, T3FREE, THYROIDAB in the last 72 hours.  Invalid input(s): FREET3 Anemia work up No results for input(s): VITAMINB12, FOLATE, FERRITIN, TIBC, IRON, RETICCTPCT in the last 72 hours. Urinalysis    Component Value Date/Time   COLORURINE YELLOW 05/23/2017 1028   APPEARANCEUR CLEAR 05/23/2017 1028   LABSPEC 1.021 05/23/2017 1028   PHURINE 5.0 05/23/2017 1028   GLUCOSEU NEGATIVE 05/23/2017 1028   HGBUR SMALL (A) 05/23/2017 1028   BILIRUBINUR NEGATIVE 05/23/2017 1028   KETONESUR 20 (A) 05/23/2017 1028   PROTEINUR 100 (A) 05/23/2017 1028   UROBILINOGEN 0.2 12/06/2012 1725   NITRITE NEGATIVE 05/23/2017 1028   LEUKOCYTESUR NEGATIVE 05/23/2017 1028   Sepsis Labs Invalid input(s): PROCALCITONIN,  WBC,  LACTICIDVEN Microbiology Recent Results (from the past 240 hour(s))  MRSA PCR Screening     Status: None   Collection Time: 05/23/17  4:40 PM  Result Value Ref Range Status   MRSA by PCR NEGATIVE NEGATIVE Final    Comment:        The GeneXpert MRSA Assay (FDA approved for NASAL specimens only), is one component of  a comprehensive MRSA colonization surveillance program. It is not intended to diagnose MRSA infection nor to guide or monitor treatment for MRSA infections. Performed at [facility], [address], [place], [place] [address]      Time coordinating discharge in minutes: 65  SIGNED:   Calvert Cantor, MD  [facility] 05/27/2017, 11:56 AM Pager   If 7PM-7AM, please contact night-coverage [email or web address] Password [email or web address]

## 2017-05-27 NOTE — Discharge Instructions (Signed)
You will need to follow up with your PCP for blood work next week to recheck your lifer function tests   Alcohol Use Disorder Alcohol use disorder is when your drinking disrupts your daily life. When you have this condition, you drink too much alcohol and you cannot control your drinking. Alcohol use disorder can cause serious problems with your physical health. It can affect your brain, heart, liver, pancreas, immune system, stomach, and intestines. Alcohol use disorder can increase your risk for certain cancers and cause problems with your mental health, such as depression, anxiety, psychosis, delirium, and dementia. People with this disorder risk hurting themselves and others. What are the causes? This condition is caused by drinking too much alcohol over time. It is not caused by drinking too much alcohol only one or two times. Some people with this condition drink alcohol to cope with or escape from negative life events. Others drink to relieve pain or symptoms of mental illness. What increases the risk? You are more likely to develop this condition if:  You have a family history of alcohol use disorder.  Your culture encourages drinking to the point of intoxication, or makes alcohol easy to get.  You had a mood or conduct disorder in childhood.  You have been a victim of abuse.  You are an adolescent and: ? You have poor grades or difficulties in school. ? Your caregivers do not talk to you about saying no to alcohol, or supervise your activities. ? You are impulsive or you have trouble with self-control.  What are the signs or symptoms? Symptoms of this condition include:  Drinkingmore than you want to.  Drinking for longer than you want to.  Trying several times to drink less or to control your drinking.  Spending a lot of time getting alcohol, drinking, or recovering from drinking.  Craving alcohol.  Having problems at work, at school, or at home due to  drinking.  Having problems in relationships due to drinking.  Drinking when it is dangerous to drink, such as before driving a car.  Continuing to drink even though you know you might have a physical or mental problem related to drinking.  Needing more and more alcohol to get the same effect you want from the alcohol (building up tolerance).  Having symptoms of withdrawal when you stop drinking. Symptoms of withdrawal include: ? Fatigue. ? Nightmares. ? Trouble sleeping. ? Depression. ? Anxiety. ? Fever. ? Seizures. ? Severe confusion. ? Feeling or seeing things that are not there (hallucinations). ? Tremors. ? Rapid heart rate. ? Rapid breathing. ? High blood pressure.  Drinking to avoid symptoms of withdrawal.  How is this diagnosed? This condition is diagnosed with an assessment. Your health care provider may start the assessment by asking three or four questions about your drinking. Your health care provider may perform a physical exam or do lab tests to see if you have physical problems resulting from alcohol use. She or he may refer you to a mental health professional for evaluation. How is this treated? Some people with alcohol use disorder are able to reduce their alcohol use to low-risk levels. Others need to completely quit drinking alcohol. When necessary, mental health professionals with specialized training in substance use treatment can help. Your health care provider can help you decide how severe your alcohol use disorder is and what type of treatment you need. The following forms of treatment are available:  Detoxification. Detoxification involves quitting drinking and using prescription medicines within  the first week to help lessen withdrawal symptoms. This treatment is important for people who have had withdrawal symptoms before and for heavy drinkers who are likely to have withdrawal symptoms. Alcohol withdrawal can be dangerous, and in severe cases, it can cause  death. Detoxification may be provided in a home, community, or primary care setting, or in a hospital or substance use treatment facility.  Counseling. This treatment is also called talk therapy. It is provided by substance use treatment counselors. A counselor can address the reasons you use alcohol and suggest ways to keep you from drinking again or to prevent problem drinking. The goals of talk therapy are to: ? Find healthy activities and ways for you to cope with stress. ? Identify and avoid the things that trigger your alcohol use. ? Help you learn how to handle cravings.  Medicines.Medicines can help treat alcohol use disorder by: ? Decreasing alcohol cravings. ? Decreasing the positive feeling you have when you drink alcohol. ? Causing an uncomfortable physical reaction when you drink alcohol (aversion therapy).  Support groups. Support groups are led by people who have quit drinking. They provide emotional support, advice, and guidance.  These forms of treatment are often combined. Some people with this condition benefit from a combination of treatments provided by specialized substance use treatment centers. Follow these instructions at home:  Take over-the-counter and prescription medicines only as told by your health care provider.  Check with your health care provider before starting any new medicines.  Ask friends and family members not to offer you alcohol.  Avoid situations where alcohol is served, including gatherings where others are drinking alcohol.  Create a plan for what to do when you are tempted to use alcohol.  Find hobbies or activities that you enjoy that do not include alcohol.  Keep all follow-up visits as told by your health care provider. This is important. How is this prevented?  If you drink, limit alcohol intake to no more than 1 drink a day for nonpregnant women and 2 drinks a day for men. One drink equals 12 oz of beer, 5 oz of wine, or 1 oz of  hard liquor.  If you have a mental health condition, get treatment and support.  Do not give alcohol to adolescents.  If you are an adolescent: ? Do not drink alcohol. ? Do not be afraid to say no if someone offers you alcohol. Speak up about why you do not want to drink. You can be a positive role model for your friends and set a good example for those around you by not drinking alcohol. ? If your friends drink, spend time with others who do not drink alcohol. Make new friends who do not use alcohol. ? Find healthy ways to manage stress and emotions, such as meditation or deep breathing, exercise, spending time in nature, listening to music, or talking with a trusted friend or family member. Contact a health care provider if:  You are not able to take your medicines as told.  Your symptoms get worse.  You return to drinking alcohol (relapse) and your symptoms get worse. Get help right away if:  You have thoughts about hurting yourself or others. If you ever feel like you may hurt yourself or others, or have thoughts about taking your own life, get help right away. You can go to your nearest emergency department or call:  Your local emergency services (911 in the [place]  A suicide crisis helpline, such as the  National Suicide Prevention Lifeline at 418-736-1712. This is open 24 hours a day.  Summary  Alcohol use disorder is when your drinking disrupts your daily life. When you have this condition, you drink too much alcohol and you cannot control your drinking.  Treatment may include detoxification, counseling, medicine, and support groups.  Ask friends and family members not to offer you alcohol. Avoid situations where alcohol is served.  Get help right away if you have thoughts about hurting yourself or others. This information is not intended to replace advice given to you by your health care provider. Make sure you discuss any questions you have with your health care  provider. Document Released: 02/03/2004 Document Revised: 09/23/2015 Document Reviewed: 09/23/2015 Elsevier Interactive Patient Education  2018 [employer].    Alcohol Abuse and Nutrition Alcohol abuse is any pattern of alcohol consumption that harms your health, relationships, or work. Alcohol abuse can affect how your body breaks down and absorbs nutrients from food by causing your liver to work abnormally. Additionally, many people who abuse alcohol do not eat enough carbohydrates, protein, fat, vitamins, and minerals. This can cause poor nutrition (malnutrition) and a lack of nutrients (nutrient deficiencies), which can lead to further complications. Nutrients that are commonly lacking (deficient) among people who abuse alcohol include:  Vitamins. ? Vitamin A. This is stored in your liver. It is important for your vision, metabolism, and ability to fight off infections (immunity). ? B vitamins. These include vitamins such as folate, thiamin, and niacin. These are important in new cell growth and maintenance. ? Vitamin C. This plays an important role in iron absorption, wound healing, and immunity. ? Vitamin D. This is produced by your liver, but you can also get vitamin D from food. Vitamin D is necessary for your body to absorb and use calcium.  Minerals. ? Calcium. This is important for your bones and your heart and blood vessel (cardiovascular) function. ? Iron. This is important for blood, muscle, and nervous system functioning. ? Magnesium. This plays an important role in muscle and nerve function, and it helps to control blood sugar and blood pressure. ? Zinc. This is important for the normal function of your nervous system and digestive system (gastrointestinal tract).  Nutrition is an essential component of therapy for alcohol abuse. Your health care provider or dietitian will work with you to design a plan that can help restore nutrients to your body and prevent potential  complications. What is my plan? Your dietitian may develop a specific diet plan that is based on your condition and any other complications you may have. A diet plan will commonly include:  A balanced diet. ? Grains: 6-8 oz per day. ? Vegetables: 2-3 cups per day. ? Fruits: 1-2 cups per day. ? Meat and other protein: 5-6 oz per day. ? Dairy: 2-3 cups per day.  Vitamin and mineral supplements.  What do I need to know about alcohol and nutrition?  Consume foods that are high in antioxidants, such as grapes, berries, nuts, green tea, and dark green and orange vegetables. This can help to counteract some of the stress that is placed on your liver by consuming alcohol.  Avoid food and drinks that are high in fat and sugar. Foods such as sugared soft drinks, salty snack foods, and candy contain empty calories. This means that they lack important nutrients such as protein, fiber, and vitamins.  Eat frequent meals and snacks. Try to eat 5-6 small meals each day.  Eat a variety  of fresh fruits and vegetables each day. This will help you get plenty of water, fiber, and vitamins in your diet.  Drink plenty of water and other clear fluids. Try to drink at least 48-64 oz (1.5-2 L) of water per day.  If you are a vegetarian, eat a variety of protein-rich foods. Pair whole grains with plant-based proteins at meals and snacks to obtain the greatest nutrient benefit from your food. For example, eat rice with beans, put peanut butter on whole-grain toast, or eat oatmeal with sunflower seeds.  Soak beans and whole grains overnight before cooking. This can help your body to absorb the nutrients more easily.  Include foods fortified with vitamins and minerals in your diet. Commonly fortified foods include milk, orange juice, cereal, and bread.  If you are malnourished, your dietitian may recommend a high-protein, high-calorie diet. This may include: ? 2,000-3,000 calories (kilocalories) per day. ? 70-100  grams of protein per day.  Your health care provider may recommend a complete nutritional supplement beverage. This can help to restore calories, protein, and vitamins to your body. Depending on your condition, you may be advised to consume this instead of or in addition to meals.  Limit your intake of caffeine. Replace drinks like coffee and black tea with decaffeinated coffee and herbal tea.  Eat a variety of foods that are high in omega fatty acids. These include fish, nuts and seeds, and soybeans. These foods may help your liver to recover and may also stabilize your mood.  Certain medicines may cause changes in your appetite, taste, and weight. Work with your health care provider and dietitian to make any adjustments to your medicines and diet plan.  Include other healthy lifestyle choices in your daily routine. ? Be physically active. ? Get enough sleep. ? Spend time doing activities that you enjoy.  If you are unable to take in enough food and calories by mouth, your health care provider may recommend a feeding tube. This is a tube that passes through your nose and throat, directly into your stomach. Nutritional supplement beverages can be given to you through the feeding tube to help you get the nutrients you need.  Take vitamin or mineral supplements as recommended by your health care provider. What foods can I eat? Grains Enriched pasta. Enriched rice. Fortified whole-grain bread. Fortified whole-grain cereal. Barley. Brown rice. Quinoa. Millet. Vegetables All fresh, frozen, and canned vegetables. Spinach. Kale. Artichoke. Carrots. Winter squash and pumpkin. Sweet potatoes. Broccoli. Cabbage. Cucumbers. Tomatoes. Sweet peppers. Green beans. Peas. Corn. Fruits All fresh and frozen fruits. Berries. Grapes. Mango. Papaya. Guava. Cherries. Apples. Bananas. Peaches. Plums. Pineapple. Watermelon. Cantaloupe. Oranges. Avocado. Meats and Other Protein Sources Beef liver. Lean beef. Pork.  Fresh and canned chicken. Fresh fish. Oysters. Sardines. Canned tuna. Shrimp. Eggs with yolks. Nuts and seeds. Peanut butter. Beans and lentils. Soybeans. Tofu. Dairy Whole, low-fat, and nonfat milk. Whole, low-fat, and nonfat yogurt. Cottage cheese. Sour cream. Hard and soft cheeses. Beverages Water. Herbal tea. Decaffeinated coffee. Decaffeinated green tea. 100% fruit juice. 100% vegetable juice. Instant breakfast shakes. Condiments Ketchup. Mayonnaise. Mustard. Salad dressing. Barbecue sauce. Sweets and Desserts Sugar-free ice cream. Sugar-free pudding. Sugar-free gelatin. Fats and Oils Butter. Vegetable oil, flaxseed oil, olive oil, and walnut oil. Other Complete nutrition shakes. Protein bars. Sugar-free gum. The items listed above may not be a complete list of recommended foods or beverages. Contact your dietitian for more options. What foods are not recommended? Grains Sugar-sweetened breakfast cereals. Flavored instant oatmeal. [place]  breads. Vegetables Breaded or deep-fried vegetables. Fruits Dried fruit with added sugar. Candied fruit. Canned fruit in syrup. Meats and Other Protein Sources Breaded or deep-fried meats. Dairy Flavored milks. Fried cheese curds or fried cheese sticks. Beverages Alcohol. Sugar-sweetened soft drinks. Sugar-sweetened tea. Caffeinated coffee and tea. Condiments Sugar. Honey. Agave nectar. Molasses. Sweets and Desserts Chocolate. Cake. Cookies. Candy. Other Potato chips. Pretzels. Salted nuts. Candied nuts. The items listed above may not be a complete list of foods and beverages to avoid. Contact your dietitian for more information. This information is not intended to replace advice given to you by your health care provider. Make sure you discuss any questions you have with your health care provider. Document Released: 10/20/2004 Document Revised: 05/05/2015 Document Reviewed: 07/29/2013 Elsevier Interactive Patient Education  2018 [employer].  [facility] options: [facility] ([facility] [phone] [address], [place], [place] [address] Call for assessment appointment or go to walk in clinic Tuesdays  [facility] ([facility]) [phone] [address], [place], [place] [address] Call to do pre-screen for admission   [facility]- [phone] [address] [place], [place], [place] [address] Call to do pre-screen for admission  [facility] - [phone] [address], [place], [place] [address] Call for assessment

## 2017-05-27 NOTE — Progress Notes (Signed)
  Echocardiogram 2D Echocardiogram has been performed.  Alex Logan 05/27/2017, 10:25 AM

## 2018-08-08 ENCOUNTER — Emergency Department (HOSPITAL_COMMUNITY)
Admission: EM | Admit: 2018-08-08 | Discharge: 2018-08-08 | Disposition: A | Discharge status: Home / Self Care | Payer: BC Managed Care – PPO | Attending: Emergency Medicine | Admitting: Emergency Medicine

## 2018-08-08 ENCOUNTER — Encounter (HOSPITAL_COMMUNITY): Payer: Self-pay | Admitting: Obstetrics and Gynecology

## 2018-08-08 ENCOUNTER — Ambulatory Visit (HOSPITAL_COMMUNITY)
Admission: RE | Admit: 2018-08-08 | Discharge: 2018-08-08 | Disposition: A | Discharge status: Home / Self Care | Payer: BC Managed Care – PPO | Source: Home / Self Care | Attending: Psychiatry | Admitting: Psychiatry

## 2018-08-08 ENCOUNTER — Emergency Department (HOSPITAL_COMMUNITY)
Admission: EM | Admit: 2018-08-08 | Discharge: 2018-08-08 | Disposition: A | Discharge status: Home / Self Care | Payer: BC Managed Care – PPO | Source: Home / Self Care | Attending: Emergency Medicine | Admitting: Emergency Medicine

## 2018-08-08 ENCOUNTER — Encounter (HOSPITAL_COMMUNITY): Payer: Self-pay

## 2018-08-08 ENCOUNTER — Other Ambulatory Visit: Payer: Self-pay

## 2018-08-08 DIAGNOSIS — I10 Essential (primary) hypertension: Secondary | ICD-10-CM | POA: Insufficient documentation

## 2018-08-08 DIAGNOSIS — Y900 Blood alcohol level of less than 20 mg/100 ml: Secondary | ICD-10-CM | POA: Diagnosis not present

## 2018-08-08 DIAGNOSIS — F1020 Alcohol dependence, uncomplicated: Secondary | ICD-10-CM

## 2018-08-08 DIAGNOSIS — Z79899 Other long term (current) drug therapy: Secondary | ICD-10-CM | POA: Insufficient documentation

## 2018-08-08 DIAGNOSIS — F332 Major depressive disorder, recurrent severe without psychotic features: Secondary | ICD-10-CM | POA: Insufficient documentation

## 2018-08-08 DIAGNOSIS — F1010 Alcohol abuse, uncomplicated: Secondary | ICD-10-CM | POA: Insufficient documentation

## 2018-08-08 LAB — COMPREHENSIVE METABOLIC PANEL
ALT: 53 U/L — ABNORMAL HIGH (ref 0–44)
AST: 102 U/L — ABNORMAL HIGH (ref 15–41)
Albumin: 4.7 g/dL (ref 3.5–5.0)
Alkaline Phosphatase: 68 U/L (ref 38–126)
Anion gap: 28 — ABNORMAL HIGH (ref 5–15)
BUN: 13 mg/dL (ref 6–20)
CO2: 16 mmol/L — ABNORMAL LOW (ref 22–32)
Calcium: 9.3 mg/dL (ref 8.9–10.3)
Chloride: 97 mmol/L — ABNORMAL LOW (ref 98–111)
Creatinine, Ser: 1.1 mg/dL (ref 0.61–1.24)
GFR calc Af Amer: >60 mL/min (ref >60)
GFR calc non Af Amer: >60 mL/min (ref >60)
Glucose, Bld: 157 mg/dL — ABNORMAL HIGH (ref 70–99)
Potassium: 3.6 mmol/L (ref 3.5–5.1)
Sodium: 141 mmol/L (ref 135–145)
Total Bilirubin: 1.2 mg/dL (ref 0.3–1.2)
Total Protein: 7.7 g/dL (ref 6.5–8.1)

## 2018-08-08 LAB — CBC
HCT: 44.4 % (ref 39.0–52.0)
Hemoglobin: 15.4 g/dL (ref 13.0–17.0)
MCH: 31.8 pg (ref 26.0–34.0)
MCHC: 34.7 g/dL (ref 30.0–36.0)
MCV: 91.7 fL (ref 80.0–100.0)
Platelets: 216 10*3/uL (ref 150–400)
RBC: 4.84 MIL/uL (ref 4.22–5.81)
RDW: 13.2 % (ref 11.5–15.5)
WBC: 14.7 10*3/uL — ABNORMAL HIGH (ref 4.0–10.5)
nRBC: 0 % (ref 0.0–0.2)

## 2018-08-08 LAB — MAGNESIUM: Magnesium: 1.4 mg/dL — ABNORMAL LOW (ref 1.7–2.4)

## 2018-08-08 LAB — ETHANOL: Alcohol, Ethyl (B): 10 mg/dL — ABNORMAL HIGH (ref <10)

## 2018-08-08 LAB — RAPID URINE DRUG SCREEN, HOSP PERFORMED
Amphetamines: NOT DETECTED
Barbiturates: NOT DETECTED
Benzodiazepines: NOT DETECTED
Cocaine: NOT DETECTED
Opiates: NOT DETECTED
Tetrahydrocannabinol: NOT DETECTED

## 2018-08-08 MED ORDER — ADULT MULTIVITAMIN W/MINERALS CH
1.0000 | ORAL_TABLET | Freq: Every day | ORAL | Status: DC
  Administered 2018-08-08: 1 via ORAL
  Filled 2018-08-08: qty 1

## 2018-08-08 MED ORDER — ESCITALOPRAM OXALATE 10 MG PO TABS
10.0000 mg | ORAL_TABLET | Freq: Every day | ORAL | Status: DC
  Administered 2018-08-08: 10 mg via ORAL
  Filled 2018-08-08: qty 1

## 2018-08-08 MED ORDER — VITAMIN B-1 100 MG PO TABS
100.0000 mg | ORAL_TABLET | Freq: Every day | ORAL | Status: DC
  Administered 2018-08-08: 100 mg via ORAL
  Filled 2018-08-08: qty 1

## 2018-08-08 MED ORDER — AMLODIPINE BESYLATE 5 MG PO TABS
5.0000 mg | ORAL_TABLET | Freq: Every day | ORAL | Status: DC
  Administered 2018-08-08: 5 mg via ORAL
  Filled 2018-08-08: qty 1

## 2018-08-08 MED ORDER — CHLORDIAZEPOXIDE HCL 25 MG PO CAPS: ORAL_CAPSULE | ORAL | 0 refills | Status: DC

## 2018-08-08 MED ORDER — FAMOTIDINE IN NACL 20-0.9 MG/50ML-% IV SOLN
20.0000 mg | Freq: Once | INTRAVENOUS | Status: AC
  Administered 2018-08-08: 20 mg via INTRAVENOUS
  Filled 2018-08-08: qty 50

## 2018-08-08 MED ORDER — LORAZEPAM 2 MG/ML IJ SOLN
2.0000 mg | Freq: Once | INTRAMUSCULAR | Status: AC
  Administered 2018-08-08: 2 mg via INTRAVENOUS
  Filled 2018-08-08: qty 1

## 2018-08-08 MED ORDER — CHLORDIAZEPOXIDE HCL 25 MG PO CAPS
25.0000 mg | ORAL_CAPSULE | Freq: Once | ORAL | Status: AC
  Administered 2018-08-08: 25 mg via ORAL
  Filled 2018-08-08: qty 1

## 2018-08-08 MED ORDER — FOLIC ACID 1 MG PO TABS
1.0000 mg | ORAL_TABLET | Freq: Every day | ORAL | Status: DC
  Administered 2018-08-08: 1 mg via ORAL
  Filled 2018-08-08: qty 1

## 2018-08-08 MED ORDER — CHLORDIAZEPOXIDE HCL 25 MG PO CAPS
50.0000 mg | ORAL_CAPSULE | Freq: Three times a day (TID) | ORAL | Status: DC
  Administered 2018-08-08: 50 mg via ORAL
  Filled 2018-08-08 (×2): qty 2

## 2018-08-08 MED ORDER — MAGNESIUM SULFATE 2 GM/50ML IV SOLN
2.0000 g | INTRAVENOUS | Status: AC
  Administered 2018-08-08: 2 g via INTRAVENOUS
  Filled 2018-08-08: qty 50

## 2018-08-08 MED ORDER — LACTATED RINGERS IV BOLUS
1000.0000 mL | Freq: Once | INTRAVENOUS | Status: AC
  Administered 2018-08-08: 13:00:00 1000 mL via INTRAVENOUS

## 2018-08-08 NOTE — ED Notes (Signed)
Explained to pt that he needs to follow up with one of the resources listed on his discharge papers. Pt was provided with 3 pages of possible resources for detox assistance. Pt then stated that he was going back across the [facility]. Reminded pt that he was already told during his discharge earlier today that he does not meet criteria to be admitted to [facility] at this time. Explained to pt that he needs to follow discharge instructions. Also told pt that due to his BP, he may need to follow up with a PCP in order to get proper medications which can be taken long term to control BP levels.

## 2018-08-08 NOTE — Patient Outreach (Signed)
ED Peer Support Specialist Patient Intake (Complete at intake & 30-60 Day Follow-up)  Name: Alex Logan  MRN: [id number]  Age: 53 y.o.   Date of Admission: 08/08/2018  Intake: Initial Comments:      Primary Reason Admitted: Alcohol Problem   Lab values: Alcohol/ETOH: Positive Positive UDS? Drug screen not completed Amphetamines: Drug screen not completed Barbiturates: Drug screen not completed Benzodiazepines: Drug screen not completed Cocaine: Drug screen not completed Opiates: Drug screen not completed Cannabinoids: Drug screen not completed  Demographic information: Gender: Male Ethnicity: White Marital Status: Divorced Insurance Status: Diplomatic Services operational officer (Work Neurosurgeon, Physicist, medical, etc.: No Lives with: Alone Living situation: House/Apartment  Reported Patient History: Patient reported health conditions: Depression Patient aware of HIV and hepatitis status: No  In past year, has patient visited ED for any reason? No  Number of ED visits:    Reason(s) for visit:    In past year, has patient been hospitalized for any reason? No  Number of hospitalizations:    Reason(s) for hospitalization:    In past year, has patient been arrested? No  Number of arrests:    Reason(s) for arrest:    In past year, has patient been incarcerated?    Number of incarcerations:    Reason(s) for incarceration:    In past year, has patient received medication-assisted treatment? No  In past year, patient received the following treatments: Other (comment)(AA meetings)  In past year, has patient received any harm reduction services? No  Did this include any of the following?    In past year, has patient received care from a mental health provider for diagnosis other than SUD? No  In past year, is this first time patient has overdosed? No  Number of past overdoses:    In past year, is this first time patient has been  hospitalized for an overdose? No  Number of hospitalizations for overdose(s):    Is patient currently receiving treatment for a mental health diagnosis? No  Patient reports experiencing difficulty participating in SUD treatment: No    Most important reason(s) for this difficulty?    Has patient received prior services for treatment? No  In past, patient has received services from following agencies:    Plan of Care:  Suggested follow up at these agencies/treatment centers: [facility](Stated that he has a plan to attend [facility])  Other information: CPSS John and myself met with Pt an was able to process about what brought Pt in. Pt stated that he has a drinking problem and that he wants to seek help. Pt discussed the fact that he has a plan to attend [facility] once leaving the hospital. Pt stated that he has been to several places seeking help along with attending [facility] meetings. CPSS Jenny Reichmann addressed a few residential facilities that Pt may benefit from. CPSS John gave him a list of AA meetings and facility contact information. CPSS left contact information also for Pt to use in the community as needed.     Aaron Edelman Venancio Chenier, CPSS  08/08/2018 1:32 PM

## 2018-08-08 NOTE — BH Assessment (Addendum)
Tele Assessment Note   Patient Name: Alex Logan MRN: [id number] Referring Physician: Carmin Muskrat, MD Location of Patient: [facility] Location of Provider: [place]  KAJUAN GUYTON is an 53 y.o. male who presents to the ED voluntarily. Pt was seen at [facility] [address] earlier this date due to alcohol intoxication. Pt was met by peer support and provided with OPT resources including [facility] and AA information. Pt was d/c from [facility] and presented to [facility] as a walk in one hour after he was d/c from [facility] due to "alcohol withdrawal". Due to the pt's presenting medical concerns while at [facility], he was sent back to [facility] from [facility] for further medical evaluation. Pt reports he has been drinking heavy amounts of alcohol daily and he feels "today things got out of hand." Pt states he drank 2 fifths of alcohol around 4am this morning. Pt states he does not feel his excessive alcohol abuse was triggered by any specific event. Pt denies SI, HI, and denies AVH at present. Pt has been admitted to [facility] in 2018 due to alcohol abuse. Pt states his alcohol abuse has caused him legal trouble. Pt states he has an upcoming court date due to breaking and entering. Pt states he got intoxicated and began to experience delusions that his cars were stolen and was trying to break into other cars. Pt states he thought the cars were his and he also called police to report his car was stolen, all of this occurring while he was under the influence of alcohol. Pt states he has been admitted to [facility] in 2017 due to alcohol abuse but he did not have a good experience at this facility. Pt states his family has a hx of AA and states his father was in and out of rehab for most of his life before he quit drinking completely 10 years ago. Pt states he has plans to follow up with [facility].   TTS consulted with Lindon Romp, NP who states the pt does not meet criteria for inpt tx. Pt has been provided with OPT resources  from peer support and is recommended to f/u with the resources he has been given. EDP Dr. Deno Etienne, MD and Dylan in triage have been advised.  Diagnosis: MDD, recurrent, moderate, w/o psychosis; Alcohol use d/o, severe  Past Medical History:  Past Medical History:  Diagnosis Date  . Alcohol abuse   . Depression     Past Surgical History:  Procedure Laterality Date  . NO PAST SURGERIES      Family History:  Family History  Problem Relation Age of Onset  . Emphysema Mother   . Bipolar disorder Father   . Alcohol abuse Father   . Mental illness Neg Hx     Social History:  reports that he has never smoked. He has never used smokeless tobacco. He reports current alcohol use. He reports that he does not use drugs.  Additional Social History:  Alcohol / Drug Use Pain Medications: See MAR Prescriptions: See MAR Over the Counter: See MAR History of alcohol / drug use?: Yes Longest period of sobriety (when/how long): 180 days Negative Consequences of Use: Legal, Personal relationships Withdrawal Symptoms: Tremors, Tachycardia, Seizures, Nausea / Vomiting, Patient aware of relationship between substance abuse and physical/medical complications Onset of Seizures: pt unsure Date of most recent seizure: pt unsure, states possibly 2018 Substance #1 Name of Substance 1: Alcohol 1 - Age of First Use: 28 1 - Amount (size/oz): excessive 1 -  Frequency: daily 1 - Duration: ongoing 1 - Last Use / Amount: 08/08/18  CIWA: CIWA-Ar BP: (!) 149/110 Pulse Rate: (!) 131 COWS:    Allergies: No Known Allergies  Home Medications: (Not in a hospital admission)   OB/GYN Status:  No LMP for male patient.  General Assessment Data Location of Assessment: [facility] TTS Assessment: In system Is this a Tele or Face-to-Face Assessment?: Tele Assessment Is this an Initial Assessment or a Re-assessment for this encounter?: Initial Assessment Patient Accompanied by:: N/A Language Other than English:  No Living Arrangements: Other (Comment) What gender do you identify as?: Male Marital status: Divorced Pregnancy Status: No Living Arrangements: Alone Can pt return to current living arrangement?: Yes Admission Status: Voluntary Is patient capable of signing voluntary admission?: Yes Referral Source: Self/Family/Friend Insurance type: [place] Living Arrangements: Alone Name of Psychiatrist: Dr. Buddy Duty, MD Name of Therapist: none  Education Status Is patient currently in school?: No Is the patient employed, unemployed or receiving disability?: Employed  Risk to self with the past 6 months Suicidal Ideation: No Has patient been a risk to self within the past 6 months prior to admission? : No Suicidal Intent: No Has patient had any suicidal intent within the past 6 months prior to admission? : No Is patient at risk for suicide?: No Suicidal Plan?: No Has patient had any suicidal plan within the past 6 months prior to admission? : No Access to Means: No What has been your use of drugs/alcohol within the last 12 months?: alcohol Previous Attempts/Gestures: No Triggers for Past Attempts: None known Intentional Self Injurious Behavior: None Family Suicide History: No Recent stressful life event(s): Legal Issues, Other (Comment)(substance abuse) Persecutory voices/beliefs?: No Depression: Yes Depression Symptoms: Feeling worthless/self pity, Loss of interest in usual pleasures Substance abuse history and/or treatment for substance abuse?: Yes Suicide prevention information given to non-admitted patients: Not applicable  Risk to Others within the past 6 months Homicidal Ideation: No Does patient have any lifetime risk of violence toward others beyond the six months prior to admission? : No Thoughts of Harm to Others: No Current Homicidal Intent: No Current Homicidal Plan: No Access to Homicidal Means: No History of harm to others?: No Assessment of Violence:  None Noted Does patient have access to weapons?: No Criminal Charges Pending?: Yes Describe Pending Criminal Charges: breaking and entering Does patient have a court date: Yes Court Date: 08/28/18 Is patient on probation?: No  Psychosis Hallucinations: Auditory(substance induced) Delusions: None noted  Mental Status Report Appearance/Hygiene: Unremarkable Eye Contact: Good Motor Activity: Tremors Speech: Slurred Level of Consciousness: Alert Mood: Depressed, Helpless Affect: Appropriate to circumstance Anxiety Level: None Thought Processes: Relevant, Coherent Judgement: Impaired Orientation: Person, Place, Time, Situation, Appropriate for developmental age Obsessive Compulsive Thoughts/Behaviors: None  Cognitive Functioning Concentration: Normal Memory: Remote Intact, Recent Intact Is patient IDD: No Insight: Poor Impulse Control: Poor Appetite: Fair Have you had any weight changes? : No Change Sleep: No Change Total Hours of Sleep: 8 Vegetative Symptoms: None  ADLScreening [facility] Assessment Services) Patient's cognitive ability adequate to safely complete daily activities?: Yes Patient able to express need for assistance with ADLs?: Yes Independently performs ADLs?: Yes (appropriate for developmental age)  Prior Inpatient Therapy Prior Inpatient Therapy: Yes Prior Therapy Dates: 2018 Prior Therapy Facilty/Provider(s): [facility] Reason for Treatment: ALCOHOL  Prior Outpatient Therapy Prior Outpatient Therapy: Yes Prior Therapy Dates: ongoing Prior Therapy Facilty/Provider(s): Dr. Buddy Duty, MD Reason for Treatment: med management Does patient have an  ACCT team?: No Does patient have Intensive In-House Services?  : No Does patient have [facility] services? : No Does patient have P4CC services?: No  ADL Screening (condition at time of admission) Patient's cognitive ability adequate to safely complete daily activities?: Yes Is the patient deaf or have difficulty hearing?:  No Does the patient have difficulty seeing, even when wearing glasses/contacts?: No Does the patient have difficulty concentrating, remembering, or making decisions?: No Patient able to express need for assistance with ADLs?: Yes Does the patient have difficulty dressing or bathing?: No Independently performs ADLs?: Yes (appropriate for developmental age) Does the patient have difficulty walking or climbing stairs?: No Weakness of Legs: None Weakness of Arms/Hands: None  Home Assistive Devices/Equipment Home Assistive Devices/Equipment: None    Abuse/Neglect Assessment (Assessment to be complete while patient is alone) Abuse/Neglect Assessment Can Be Completed: Yes Physical Abuse: Denies Verbal Abuse: Denies Sexual Abuse: Denies Exploitation of patient/patient's resources: Denies Self-Neglect: Denies     Regulatory affairs officer (For Healthcare) Does Patient Have a Medical Advance Directive?: Yes Type of Advance Directive: Healthcare Power of Attorney(ex-wife, Geoffry Bannister) [place] in Chart?: No - copy requested Would patient like information on creating a medical advance directive?: No - Patient declined          Disposition:  TTS consulted with Lindon Romp, NP who states the pt does not meet criteria for inpt tx. Pt has been provided with OPT resources from peer support and is recommended to f/u with the resources he has been given. EDP Dr. Deno Etienne, MD and Dylan in triage have been advised.  Disposition Initial Assessment Completed for this Encounter: Yes Disposition of Patient: Discharge Patient refused recommended treatment: No Mode of transportation if patient is discharged/movement?: Car  This service was provided via telemedicine using a 2-way, interactive audio and video technology.  Names of all persons participating in this telemedicine service and their role in this encounter. Name: Shaye Lagace Hornback Role: Patient  Name: Lind Covert  Role: TTS          Lyanne Co 08/08/2018 10:28 PM

## 2018-08-08 NOTE — Discharge Instructions (Signed)
Please be sure to use the provided resources to proceed to a rehabilitation facility. Return here for concerning changes.

## 2018-08-08 NOTE — Progress Notes (Signed)
TTS consulted with Lindon Romp, NP who states the pt does not meet criteria for inpt tx. Pt has been provided with OPT resources from peer support and is recommended to f/u with the resources he has been given. EDP Dr. Deno Etienne, MD and Dylan in triage have been advised.  Lind Covert, MSW, LCSW Therapeutic Triage Specialist  [phone]

## 2018-08-08 NOTE — ED Triage Notes (Addendum)
Patient was discharged from the ED today. Patient was taken to [facility] by his father. BH called EMS because of hypertension.. patient denies SOB or chest pain. Patient states his last drink was 0400.

## 2018-08-08 NOTE — Progress Notes (Signed)
This Probation officer informed the [facility] and the txing physician of the pt's elevated BP along with their hx of seizures and alcohol abuse. Pt reports last drinking alcohol at 0400 this morning. Pt is unable to fill out his evaluation on his own. Pt's father had to assist in filling out pt's evaluation. Pt is visibly shaking and anxious. Pt has been informed that he is going to [facility] ED via EMS.   [facility] ED has been informed of the pt's soon to be arrival and situation.   [id number] EMS has arrived and pt is being transported from the OBS lobby.

## 2018-08-08 NOTE — Progress Notes (Addendum)
Patient ID: Alex Logan, male   DOB: 22-Sep-1965, 53 y.o.   MRN: [id number]  Pt presented to [facility], as a walk-in, for alcohol withdrawal. He is with his father. He is so tremulous he can not fill out the forms. He is tachycardic and his BP is 170/117. His last drink was at 0400. He had been sober for 7 months and recently started drinking again. He drinks at least one pint of liqour daily. His BAL was <10 and UDS negative. His withdrawal symptoms are quite severe. He has a history of alcohol withdrawal seizures and DT's. He was discharged from [facility] with an elevated BP of 163/106.  He was seen at [facility] for the same. He was seen by Peer Support and provided with resources. Labs were drawn and he was medically cleared. Per chart review: Pt has had multiple inpatient hospital admissions for complicated alcohol withdrawal. His last such admission was in May 2019. Due to concerns surrounding his complicated alcohol withdrawal, Pt will return to [facility] for closer medical supervision.   Ethelene Hal, NP 08/08/2018      1741  Patient's chart reviewed. Reviewed the information documented and agree with the treatment plan.  Buford Dresser, DO 08/09/18 3:03 PM

## 2018-08-08 NOTE — ED Triage Notes (Signed)
Pt reports he believes he has alcohol poisoning. Pt reports he drank 2/5ths of liquor around 0400 this morning. Pt is actively shaking and tachycardic at this time.

## 2018-08-08 NOTE — ED Provider Notes (Signed)
[facility]-EMERGENCY DEPT Provider Note   CSN: [id number] Arrival date & time: 08/08/18  1705     History   Chief Complaint Chief Complaint  Patient presents with  . Hypertension  . detox    HPI Alex Logan is a 53 y.o. male.     HPI Patient presents for the second time today, with similar concerns. Notably, I saw the patient earlier, and he was discharged to pursue outpatient follow-up for alcohol abuse.  On discharge the patient was awake, alert, ambulatory, with no ongoing vomiting. He was tachycardic, mildly, hypertensive mildly, but had received initial benzodiazepine in preparation for stabilization prior to outpatient follow-up.  Patient notes that he went to our affiliated behavioral health center.  There, with concern for his hypertension and tachycardia he was sent here for medical clearance. When asked how the patient is feeling, he replies" I feel good". History is provided by the patient, but also obtained by nursing staff, who spoke with nursing staff at our behavioral health facility.  Past Medical History:  Diagnosis Date  . Alcohol abuse   . Depression     Patient Active Problem List   Diagnosis Date Noted  . Elevated LFTs 05/27/2017  . Muscular abdominal pain in right flank 05/27/2017  . Fatty liver, alcoholic 05/27/2017  . Depression with anxiety 05/27/2017  . Pancytopenia (HCC) 05/27/2017  . SVT (supraventricular tachycardia) (HCC)   . HTN (hypertension) 05/23/2017  . Acute vomiting   . Pressure injury of skin 11/12/2016  . Alcohol withdrawal (HCC) 11/10/2016  . SIRS (systemic inflammatory response syndrome) (HCC) 11/10/2016  . High anion gap metabolic acidosis 11/10/2016  . Lactic acidosis 11/10/2016  . Hyponatremia 11/10/2016  . Starvation ketoacidosis 11/10/2016  . Alcohol use disorder, severe, dependence (HCC) 09/27/2016  . Alcohol abuse with alcohol-induced mood disorder (HCC) 09/22/2016    Past Surgical  History:  Procedure Laterality Date  . NO PAST SURGERIES          Home Medications    Prior to Admission medications   Medication Sig Start Date End Date Taking? Authorizing Provider  amLODipine (NORVASC) 5 MG tablet Take 1 tablet (5 mg total) daily by mouth. 11/15/16   Darlin DropHall, Carole N, DO  chlordiazePOXIDE (LIBRIUM) 25 MG capsule 50mg  PO TID x 1D, then 25-50mg  PO BID X 1D, then 25-50mg  PO QD X 1D 08/08/18   Gerhard MunchLockwood, Charlise Giovanetti, MD  diclofenac sodium (VOLTAREN) 1 % GEL Apply 2 g topically 4 (four) times daily. 05/27/17   Calvert Cantorizwan, Saima, MD  escitalopram (LEXAPRO) 10 MG tablet Take 1 tablet (10 mg total) by mouth daily. 09/28/16   Oneta RackLewis, Tanika N, NP  folic acid (FOLVITE) 1 MG tablet Take 1 tablet (1 mg total) by mouth daily. 05/28/17   Calvert Cantorizwan, Saima, MD  ibuprofen (ADVIL,MOTRIN) 600 MG tablet Take 1 tablet (600 mg total) by mouth every 6 (six) hours as needed for moderate pain. 05/27/17   Calvert Cantorizwan, Saima, MD  Multiple Vitamin (MULTIVITAMIN WITH MINERALS) TABS tablet Take 1 tablet by mouth daily. 05/28/17   Calvert Cantorizwan, Saima, MD  naltrexone (DEPADE) 50 MG tablet Take 2 tablets (100 mg total) by mouth daily. 05/27/17   Calvert Cantorizwan, Saima, MD  omega-3 acid ethyl esters (LOVAZA) 1 g capsule Take 1 capsule (1 g total) by mouth 2 (two) times daily. 09/27/16   Oneta RackLewis, Tanika N, NP  thiamine 100 MG tablet Take 1 tablet (100 mg total) by mouth daily. 05/28/17   Calvert Cantorizwan, Saima, MD    Family History  Family History  Problem Relation Age of Onset  . Emphysema Mother   . Bipolar disorder Father   . Alcohol abuse Father   . Mental illness Neg Hx     Social History Social History   Tobacco Use  . Smoking status: Never Smoker  . Smokeless tobacco: Never Used  Substance Use Topics  . Alcohol use: Yes    Comment: Chronic ETOH use and abuse  . Drug use: No     Allergies   Patient has no known allergies.   Review of Systems Review of Systems  Constitutional:       Per HPI, otherwise negative  HENT:       Per  HPI, otherwise negative  Respiratory:       Per HPI, otherwise negative  Cardiovascular:       Per HPI, otherwise negative  Gastrointestinal: Positive for nausea and vomiting.  Endocrine:       Negative aside from HPI  Genitourinary:       Neg aside from HPI   Musculoskeletal:       Per HPI, otherwise negative  Skin:       diaphoresis  Neurological: Positive for weakness. Negative for syncope.  Psychiatric/Behavioral:       Anxious     Physical Exam Updated Vital Signs BP (!) 158/122 (BP Location: Left Arm)   Pulse (!) 135   Temp 98.8 F (37.1 C) (Oral)   Resp 20   Ht 5\' 10"  (1.778 m)   Wt 73.5 kg   SpO2 97%   BMI 23.24 kg/m   Physical Exam Vitals signs and nursing note reviewed.  Constitutional:      Appearance: He is well-developed.     Comments: In comparison to prior exam the patient is awake, alert, in no distress sitting upright, watching television, having a normal conversation with normal cadence, speech.  HENT:     Head: Normocephalic and atraumatic.  Eyes:     Conjunctiva/sclera: Conjunctivae normal.  Cardiovascular:     Rate and Rhythm: Tachycardia present.  Pulmonary:     Effort: Tachypnea present.  Abdominal:     General: There is no distension.  Skin:    General: Skin is warm.  Neurological:     Mental Status: He is alert and oriented to person, place, and time.  Psychiatric:        Mood and Affect: Mood is anxious.      ED Treatments / Results  Labs Labs from today reviewed previously, notable for hypomagnesemia, negligible alcohol level. Patient has received fluids, magnesium repletion already. Procedures Procedures (including critical care time)  Medications Ordered in ED Medications  amLODipine (NORVASC) tablet 5 mg (has no administration in time range)  chlordiazePOXIDE (LIBRIUM) capsule 50 mg (has no administration in time range)  escitalopram (LEXAPRO) tablet 10 mg (has no administration in time range)  folic acid (FOLVITE)  tablet 1 mg (has no administration in time range)  multivitamin with minerals tablet 1 tablet (has no administration in time range)  thiamine (VITAMIN B-1) tablet 100 mg (has no administration in time range)     Initial Impression / Assessment and Plan / ED Course  I have reviewed the triage vital signs and the nursing notes.  Pertinent labs & imaging results that were available during my care of the patient were reviewed by me and considered in my medical decision making (see chart for details).  This adult male with history of alcohol abuse, frequent episodes of treatment presents  with request from our affiliated facility for medical clearance, though he has already received this. No evidence for acute withdrawal, no distress, no increased work of breathing no persistent vomiting.  Patient's tachycardia, hypertension consistent with known recent cessation of alcohol intake, and symptoms are controlled with oral benzodiazepines. Patient in consultation with our behavioral health staff for placement.  Final Clinical Impressions(s) / ED Diagnoses  Alcohol abuse   Carmin Muskrat, MD 08/08/18 1928

## 2018-08-08 NOTE — ED Notes (Signed)
Rn called TTS to ask about a time they would be talking to patient. They reported they are backed up and unsure when they can TTS pt.

## 2018-08-08 NOTE — ED Notes (Signed)
Pt reported that they did want to leave but had been medically cleared. RN spoke to pt that they were not going to be admitted. Pt discharged without difficulty and given resources for f/u care.

## 2018-08-08 NOTE — ED Provider Notes (Signed)
[facility]-EMERGENCY DEPT Provider Note   CSN: [id number] Arrival date & time: 08/08/18  1211    History   Chief Complaint Chief Complaint  Patient presents with   Alcohol Problem    HPI Alex Logan is a 53 y.o. male.     HPI Patient presents with concern for alcohol abuse, nausea, vomiting, diaphoresis. When asked why he is here, he replies simply" alcohol". He notes that he has been drinking substantially over the past 3 days, though he has a history of alcohol abuse for a long time, including prior rehabilitation stents, prior complicated withdrawal with possible seizure activity. He notes that he feels generally uncomfortable, is nauseous, weak, was vomiting just prior to my evaluation.  No confusion, disorientation. Past Medical History:  Diagnosis Date   Alcohol abuse    Depression     Patient Active Problem List   Diagnosis Date Noted   Elevated LFTs 05/27/2017   Muscular abdominal pain in right flank 05/27/2017   Fatty liver, alcoholic 05/27/2017   Depression with anxiety 05/27/2017   Pancytopenia (HCC) 05/27/2017   SVT (supraventricular tachycardia) (HCC)    HTN (hypertension) 05/23/2017   Acute vomiting    Pressure injury of skin 11/12/2016   Alcohol withdrawal (HCC) 11/10/2016   SIRS (systemic inflammatory response syndrome) (HCC) 11/10/2016   High anion gap metabolic acidosis 11/10/2016   Lactic acidosis 11/10/2016   Hyponatremia 11/10/2016   Starvation ketoacidosis 11/10/2016   Alcohol use disorder, severe, dependence (HCC) 09/27/2016   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 Authorizing Provider  amLODipine (NORVASC) 5 MG tablet Take 1 tablet (5 mg total) daily by mouth. 11/15/16   Darlin DropHall,  Carole N, DO  diclofenac sodium (VOLTAREN) 1 % GEL Apply 2 g topically 4 (four) times daily. 05/27/17   Calvert Cantorizwan, Saima, MD  escitalopram (LEXAPRO) 10 MG tablet Take 1 tablet (10 mg total) by mouth daily. 09/28/16   Oneta RackLewis, Tanika N, NP  folic acid (FOLVITE) 1 MG tablet Take 1 tablet (1 mg total) by mouth daily. 05/28/17   Calvert Cantorizwan, Saima, MD  ibuprofen (ADVIL,MOTRIN) 600 MG tablet Take 1 tablet (600 mg total) by mouth every 6 (six) hours as needed for moderate pain. 05/27/17   Calvert Cantorizwan, Saima, MD  Multiple Vitamin (MULTIVITAMIN WITH MINERALS) TABS tablet Take 1 tablet by mouth daily. 05/28/17   Calvert Cantorizwan, Saima, MD  naltrexone (DEPADE) 50 MG tablet Take 2 tablets (100 mg total) by mouth daily. 05/27/17   Calvert Cantorizwan, Saima, MD  omega-3 acid ethyl esters (LOVAZA) 1 g capsule Take 1 capsule (1 g total) by mouth 2 (two) times daily. 09/27/16   Oneta RackLewis, Tanika N, NP  thiamine 100 MG tablet Take 1 tablet (100 mg total) by mouth daily. 05/28/17   Calvert Cantorizwan, Saima, MD    Family History Family History  Problem Relation Age of Onset   Emphysema Mother    Bipolar disorder Father    Alcohol abuse Father    Mental illness Neg Hx     Social History Social History   Tobacco Use   Smoking status: Never Smoker   Smokeless tobacco: Never Used  Substance Use Topics   Alcohol use: Yes    Comment: Chronic ETOH use and abuse   Drug use:  No     Allergies   Patient has no known allergies.   Review of Systems Review of Systems  Constitutional:       Per HPI, otherwise negative  HENT:       Per HPI, otherwise negative  Respiratory:       Per HPI, otherwise negative  Cardiovascular:       Per HPI, otherwise negative  Gastrointestinal: Positive for nausea and vomiting.  Endocrine:       Negative aside from HPI  Genitourinary:       Neg aside from HPI   Musculoskeletal:       Per HPI, otherwise negative  Skin:       diaphoresis  Neurological: Positive for weakness. Negative for syncope.   Psychiatric/Behavioral:       Anxious     Physical Exam Updated Vital Signs BP (!) 186/113 (BP Location: Left Arm)    Pulse (!) 133    Temp 98.5 F (36.9 C) (Oral)    Resp 18    SpO2 98%   Physical Exam Vitals signs and nursing note reviewed.  Constitutional:      Appearance: He is well-developed. He is ill-appearing and diaphoretic.  HENT:     Head: Normocephalic and atraumatic.  Eyes:     Conjunctiva/sclera: Conjunctivae normal.  Cardiovascular:     Rate and Rhythm: Regular rhythm. Tachycardia present.  Pulmonary:     Effort: Tachypnea present.  Abdominal:     General: There is no distension.  Skin:    General: Skin is warm.  Neurological:     Mental Status: He is alert and oriented to person, place, and time.  Psychiatric:        Mood and Affect: Mood is anxious.      ED Treatments / Results  Labs (all labs ordered are listed, but only abnormal results are displayed) Labs Reviewed  COMPREHENSIVE METABOLIC PANEL - Abnormal; Notable for the following components:      Result Value   Chloride 97 (*)    CO2 16 (*)    Glucose, Bld 157 (*)    AST 102 (*)    ALT 53 (*)    Anion gap 28 (*)    All other components within normal limits  ETHANOL - Abnormal; Notable for the following components:   Alcohol, Ethyl (B) 10 (*)    All other components within normal limits  CBC - Abnormal; Notable for the following components:   WBC 14.7 (*)    All other components within normal limits  MAGNESIUM - Abnormal; Notable for the following components:   Magnesium 1.4 (*)    All other components within normal limits  RAPID URINE DRUG SCREEN, HOSP PERFORMED    EKG EKG Interpretation  Date/Time:  Thursday August 08 2018 12:21:17 EDT Ventricular Rate:  143 PR Interval:    QRS Duration: 82 QT Interval:  286 QTC Calculation: 442 R Axis:   96 Text Interpretation:  Sinus tachycardia Ventricular premature complex Borderline right axis deviation Probable anteroseptal infarct, old  No significant change since last tracing Abnormal ECG Confirmed by Gerhard MunchLockwood, Kali Ambler [phone]([id number]) on 08/08/2018 12:25:38 PM   Radiology No results found.  Procedures Procedures (including critical care time)  Medications Ordered in ED Medications  magnesium sulfate IVPB 2 g 50 mL (2 g Intravenous New Bag/Given 08/08/18 1410)  LORazepam (ATIVAN) injection 2 mg (2 mg Intravenous Given 08/08/18 1254)  lactated ringers bolus 1,000 mL (0 mLs Intravenous Stopped 08/08/18 1410)  famotidine (  PEPCID) IVPB 20 mg premix (0 mg Intravenous Stopped 08/08/18 1355)     Initial Impression / Assessment and Plan / ED Course  I have reviewed the triage vital signs and the nursing notes.  Pertinent labs & imaging results that were available during my care of the patient were reviewed by me and considered in my medical decision making (see chart for details).    Immediately after the patient's initial evaluation with concern for acute withdrawal, the patient received fluids, Ativan, started on the withdrawal protocol.    2:28 PM Patient substantially better on repeat evaluation, calm, hemodynamically unremarkable, no active vomiting. Labs reviewed, notable for hypomagnesemia, otherwise consistent for him. Alcohol level is minimal, suggesting some degree of withdrawal, but no evidence for complication, no seizure activity, no ongoing vomiting. Patient has been seen and evaluated by our peer support services personnel, has received referral to our local alcohol abuse centers. With substantial improvement clinically, the patient will be discharged with close outpatient follow-up after initiation of Librium, with first dose here.  Final Clinical Impressions(s) / ED Diagnoses   Final diagnoses:  Alcohol abuse  Hypomagnesemia    ED Discharge Orders         Ordered    chlordiazePOXIDE (LIBRIUM) 25 MG capsule     08/08/18 1431           Carmin Muskrat, MD 08/08/18 1431

## 2018-08-08 NOTE — ED Provider Notes (Signed)
53 year old male with a chief complaint of requests for alcohol withdrawal treatment.  Patient was seen in the ED earlier today and discharged with outpatient resources when he went to an outpatient resource center he was referred back to the ED for medical clearance.  He was seen here cleared again and had TTS evaluate to told me that the patient does not meet inpatient criteria and needs to follow-up as an outpatient.  Will discharge home.  Librium taper.   Deno Etienne, DO 08/08/18 2227

## 2018-09-21 ENCOUNTER — Emergency Department (HOSPITAL_COMMUNITY)
Admission: EM | Admit: 2018-09-21 | Discharge: 2018-09-21 | Disposition: A | Discharge status: Home / Self Care | Payer: No Typology Code available for payment source | Attending: Emergency Medicine | Admitting: Emergency Medicine

## 2018-09-21 ENCOUNTER — Emergency Department (HOSPITAL_COMMUNITY): Payer: No Typology Code available for payment source

## 2018-09-21 ENCOUNTER — Encounter (HOSPITAL_COMMUNITY): Payer: Self-pay | Admitting: Emergency Medicine

## 2018-09-21 ENCOUNTER — Other Ambulatory Visit: Payer: Self-pay

## 2018-09-21 DIAGNOSIS — Z79899 Other long term (current) drug therapy: Secondary | ICD-10-CM | POA: Insufficient documentation

## 2018-09-21 DIAGNOSIS — Y999 Unspecified external cause status: Secondary | ICD-10-CM | POA: Insufficient documentation

## 2018-09-21 DIAGNOSIS — Y929 Unspecified place or not applicable: Secondary | ICD-10-CM | POA: Insufficient documentation

## 2018-09-21 DIAGNOSIS — Y9389 Activity, other specified: Secondary | ICD-10-CM | POA: Diagnosis not present

## 2018-09-21 DIAGNOSIS — S43014A Anterior dislocation of right humerus, initial encounter: Secondary | ICD-10-CM | POA: Diagnosis not present

## 2018-09-21 DIAGNOSIS — S4991XA Unspecified injury of right shoulder and upper arm, initial encounter: Secondary | ICD-10-CM | POA: Diagnosis present

## 2018-09-21 DIAGNOSIS — I10 Essential (primary) hypertension: Secondary | ICD-10-CM | POA: Diagnosis not present

## 2018-09-21 DIAGNOSIS — S43004A Unspecified dislocation of right shoulder joint, initial encounter: Secondary | ICD-10-CM

## 2018-09-21 DIAGNOSIS — X503XXA Overexertion from repetitive movements, initial encounter: Secondary | ICD-10-CM | POA: Diagnosis not present

## 2018-09-21 MED ORDER — PROPOFOL 10 MG/ML IV BOLUS
40.0000 mg | Freq: Once | INTRAVENOUS | Status: AC
  Administered 2018-09-21: 40 mg via INTRAVENOUS
  Filled 2018-09-21: qty 20

## 2018-09-21 MED ORDER — PROPOFOL 10 MG/ML IV BOLUS
INTRAVENOUS | Status: AC | PRN
  Administered 2018-09-21 (×2): 20 mg via INTRAVENOUS
  Administered 2018-09-21: 40 mg via INTRAVENOUS

## 2018-09-21 MED ORDER — FENTANYL CITRATE (PF) 100 MCG/2ML IJ SOLN
50.0000 ug | Freq: Once | INTRAMUSCULAR | Status: AC
  Administered 2018-09-21: 50 ug via INTRAVENOUS
  Filled 2018-09-21: qty 2

## 2018-09-21 NOTE — ED Notes (Signed)
Josh EMT at bed side to perform workers comp drug screen.

## 2018-09-21 NOTE — ED Triage Notes (Signed)
Pt. Stated, I was pulling a styrofoam from a oven and my shoulder pulled, this happened at 1130am.  My shoulder popped out, the pain is terrible.

## 2018-09-21 NOTE — ED Notes (Signed)
Discharge instructions discussed with Pt. Pt verbalized understanding. Pt stable and ambulatory.    

## 2018-09-21 NOTE — ED Provider Notes (Signed)
[facility] EMERGENCY DEPARTMENT Provider Note   CSN: [id number] Arrival date & time: 09/21/18  1413     History   Chief Complaint Chief Complaint  Patient presents with   Shoulder Injury    HPI Alex Logan is a 53 y.o. male.     Patient is a 53 year old male who presents with shoulder pain.  He was pulling some foam that was causing of and to be stuck and as he was pulling at he felt the pop in his shoulder and has had pain and deformity to his right shoulder since that time.  It happened about 1130 this morning.  He does have some numbness in his hand.  He denies any other injuries.  No history of similar symptoms in the past.  He has a history of hypertension and SVT.  He also has a history of alcohol abuse but he says he has not had a drink in 40 days.  He ate breakfast at 10:00 this morning but has not had anything to eat or drink since that time except for a little bit of water.  No underlying lung disease.  No respiratory illnesses.  No fevers.     Past Medical History:  Diagnosis Date   Alcohol abuse    Depression     Patient Active Problem List   Diagnosis Date Noted   Elevated LFTs 05/27/2017   Muscular abdominal pain in right flank 05/27/2017   Fatty liver, alcoholic 63/14/9702   Depression with anxiety 05/27/2017   Pancytopenia ([place]) 05/27/2017   SVT (supraventricular tachycardia) (HCC)    HTN (hypertension) 05/23/2017   Acute vomiting    Pressure injury of skin 11/12/2016   Alcohol withdrawal ([place]) 11/10/2016   SIRS (systemic inflammatory response syndrome) ([place]) 11/10/2016   High anion gap metabolic acidosis 63/78/5885   Lactic acidosis 11/10/2016   Hyponatremia 11/10/2016   Starvation ketoacidosis 11/10/2016   Alcohol use disorder, severe, dependence ([place]) 09/27/2016   Alcohol abuse with alcohol-induced mood disorder ([place]) 09/22/2016    Past Surgical History:  Procedure Laterality Date   NO PAST SURGERIES           Home Medications    Prior to Admission medications   Medication Sig Start Date End Date Taking? Authorizing Provider  amLODipine (NORVASC) 10 MG tablet Take 10 mg by mouth daily.   Yes [provider]  buPROPion (WELLBUTRIN XL) 150 MG 24 hr tablet Take 150 mg by mouth daily. 09/03/18  Yes [provider]  escitalopram (LEXAPRO) 10 MG tablet Take 1 tablet (10 mg total) by mouth daily. 09/28/16  Yes Derrill Center, NP  ibuprofen (ADVIL) 200 MG tablet Take 600 mg by mouth every 6 (six) hours as needed for mild pain.   Yes [provider]  naltrexone (DEPADE) 50 MG tablet Take 2 tablets (100 mg total) by mouth daily. Patient taking differently: Take 50 mg by mouth daily.  05/27/17  Yes Debbe Odea, MD  omega-3 acid ethyl esters (LOVAZA) 1 g capsule Take 1 capsule (1 g total) by mouth 2 (two) times daily. Patient not taking: Reported on 08/08/2018 09/27/16   Derrill Center, NP    Family History Family History  Problem Relation Age of Onset   Emphysema Mother    Bipolar disorder Father    Alcohol abuse Father    Mental illness Neg Hx     Social History Social History   Tobacco Use   Smoking status: Never Smoker   Smokeless  tobacco: Never Used  Substance Use Topics   Alcohol use: Yes    Comment: Chronic ETOH use and abuse   Drug use: No     Allergies   Patient has no known allergies.   Review of Systems Review of Systems  Constitutional: Negative for chills, diaphoresis, fatigue and fever.  HENT: Negative for congestion, rhinorrhea and sneezing.   Eyes: Negative.   Respiratory: Negative for cough, chest tightness and shortness of breath.   Cardiovascular: Negative for chest pain and leg swelling.  Gastrointestinal: Negative for abdominal pain, blood in stool, diarrhea, nausea and vomiting.  Genitourinary: Negative for difficulty urinating, flank pain, frequency and hematuria.  Musculoskeletal: Positive for arthralgias.  Negative for back pain.  Skin: Negative for rash.  Neurological: Positive for numbness. Negative for dizziness, speech difficulty, weakness and headaches.     Physical Exam Updated Vital Signs BP 130/89    Pulse 98    Temp 98.5 F (36.9 C) (Oral)    Resp (!) 22    SpO2 96%   Physical Exam Constitutional:      Appearance: He is well-developed.  HENT:     Head: Normocephalic and atraumatic.  Eyes:     Pupils: Pupils are equal, round, and reactive to light.  Neck:     Musculoskeletal: Normal range of motion and neck supple.  Cardiovascular:     Rate and Rhythm: Normal rate and regular rhythm.     Heart sounds: Normal heart sounds.  Pulmonary:     Effort: Pulmonary effort is normal. No respiratory distress.     Breath sounds: Normal breath sounds. No wheezing or rales.  Chest:     Chest wall: No tenderness.  Abdominal:     General: Bowel sounds are normal.     Palpations: Abdomen is soft.     Tenderness: There is no abdominal tenderness. There is no guarding or rebound.  Musculoskeletal: Normal range of motion.     Comments: Positive deformity to the right shoulder.  No pain to the elbow or wrist.  He has normal motor function in the hand.  Radial pulses are intact.  He has some subjective numbness diffusely to his right hand.  Lymphadenopathy:     Cervical: No cervical adenopathy.  Skin:    General: Skin is warm and dry.     Findings: No rash.  Neurological:     Mental Status: He is alert and oriented to person, place, and time.      ED Treatments / Results  Labs (all labs ordered are listed, but only abnormal results are displayed) Labs Reviewed - No data to display  EKG None  Radiology Dg Shoulder Right  Result Date: 09/21/2018 CLINICAL DATA:  Right shoulder pain due to dislocation secondary to pulling action. EXAM: RIGHT SHOULDER - 2+ VIEW COMPARISON:  None. FINDINGS: There is anterior dislocation of the right humeral head. No visible acute fracture. Old healed  fracture of the posterior aspect of the right fifth rib. IMPRESSION: Anterior dislocation of the right humeral head. Electronically Signed   By: Francene BoyersJames  Maxwell M.D.   On: 09/21/2018 15:41   Dg Shoulder Right Portable  Result Date: 09/21/2018 CLINICAL DATA:  Reduction of RIGHT shoulder dislocation. EXAM: PORTABLE RIGHT SHOULDER COMPARISON:  09/21/2018 radiograph FINDINGS: Interval relocation of the RIGHT humeral head. No dislocation or definite fracture. IMPRESSION: Interval relocation of the RIGHT humeral head. Electronically Signed   By: Harmon PierJeffrey  Hu M.D.   On: 09/21/2018 17:01    Procedures .Sedation  Date/Time: 09/21/2018 6:29 PM Performed by: Rolan BuccoBelfi, John Williamsen, MD Authorized by: Rolan BuccoBelfi, Seher Schlagel, MD   Consent:    Consent obtained:  Written   Consent given by:  Patient   Risks discussed:  Allergic reaction, inadequate sedation, vomiting, prolonged hypoxia resulting in organ damage, prolonged sedation necessitating reversal and respiratory compromise necessitating ventilatory assistance and intubation   Alternatives discussed:  Analgesia without sedation Universal protocol:    Procedure explained and questions answered to patient or proxy's satisfaction: yes     Relevant documents present and verified: yes     Test results available and properly labeled: yes     Imaging studies available: yes     Required blood products, implants, devices, and special equipment available: yes     Immediately prior to procedure a time out was called: yes     Patient identity confirmation method:  Verbally with patient Indications:    Procedure performed:  Dislocation reduction   Procedure necessitating sedation performed by:  Physician performing sedation Pre-sedation assessment:    Time since last food or drink:  6   ASA classification: class 2 - patient with mild systemic disease     Neck mobility: normal     Mouth opening:  3 or more finger widths   Mallampati score:  I - soft palate, uvula, fauces,  pillars visible   Pre-sedation assessments completed and reviewed: airway patency, cardiovascular function, hydration status, mental status, nausea/vomiting, pain level, respiratory function and temperature     Pre-sedation assessment completed:  09/21/2018 4:15 PM Immediate pre-procedure details:    Reassessment: Patient reassessed immediately prior to procedure     Reviewed: vital signs and NPO status     Verified: bag valve mask available, emergency equipment available, intubation equipment available, IV patency confirmed, oxygen available, reversal medications available and suction available   Procedure details (see MAR for exact dosages):    Preoxygenation:  Nasal cannula   Sedation:  Propofol   Intra-procedure monitoring:  Blood pressure monitoring, cardiac monitor, continuous capnometry, continuous pulse oximetry, frequent LOC assessments and frequent vital sign checks   Intra-procedure events: none     Total Provider sedation time (minutes):  30 Post-procedure details:    Post-sedation assessment completed:  09/21/2018 6:32 PM   Attendance: Constant attendance by certified staff until patient recovered     Recovery: Patient returned to pre-procedure baseline     Post-sedation assessments completed and reviewed: airway patency, cardiovascular function, hydration status, mental status, nausea/vomiting, pain level, respiratory function and temperature     Patient is stable for discharge or admission: yes     Patient tolerance:  Tolerated well, no immediate complications Reduction of dislocation  Date/Time: 09/21/2018 6:33 PM Performed by: Rolan BuccoBelfi, Latrise Bowland, MD Authorized by: Rolan BuccoBelfi, Senita Corredor, MD  Consent: Written consent obtained. Risks and benefits: risks, benefits and alternatives were discussed Consent given by: patient Patient understanding: patient states understanding of the procedure being performed Patient consent: the patient's understanding of the procedure matches consent  given Procedure consent: procedure consent matches procedure scheduled Relevant documents: relevant documents present and verified Test results: test results available and properly labeled Imaging studies: imaging studies available Patient identity confirmed: verbally with patient Time out: Immediately prior to procedure a "time out" was called to verify the correct patient, procedure, equipment, support staff and site/side marked as required. Local anesthesia used: no  Anesthesia: Local anesthesia used: no  Sedation: Patient sedated: yes Sedation type: moderate (conscious) sedation Sedatives: propofol Vitals: Vital signs were monitored during sedation.  Patient  tolerance: patient tolerated the procedure well with no immediate complications    (including critical care time)  Medications Ordered in ED Medications  propofol (DIPRIVAN) 10 mg/mL bolus/IV push 40 mg (40 mg Intravenous Given 09/21/18 1628)  fentaNYL (SUBLIMAZE) injection 50 mcg (50 mcg Intravenous Given 09/21/18 1612)  propofol (DIPRIVAN) 10 mg/mL bolus/IV push (40 mg Intravenous Given 09/21/18 1636)     Initial Impression / Assessment and Plan / ED Course  I have reviewed the triage vital signs and the nursing notes.  Pertinent labs & imaging results that were available during my care of the patient were reviewed by me and considered in my medical decision making (see chart for details).        Patient presents after shoulder dislocation.  This was reduced in the ED.  Patient is fully awake and oriented.  He is tolerating oral fluids.  He was discharged home in good condition.  He was given a referral to follow-up with orthopedics.  Final Clinical Impressions(s) / ED Diagnoses   Final diagnoses:  Shoulder dislocation, right, initial encounter    ED Discharge Orders    None       Rolan Bucco, MD 09/21/18 [id number]

## 2018-09-30 ENCOUNTER — Telehealth (HOSPITAL_COMMUNITY): Payer: Self-pay

## 2020-01-14 ENCOUNTER — Other Ambulatory Visit: Payer: Self-pay

## 2020-01-14 ENCOUNTER — Inpatient Hospital Stay (HOSPITAL_COMMUNITY)
Admission: EM | Admit: 2020-01-14 | Discharge: 2020-01-17 | DRG: 683 | Disposition: A | Discharge status: Home / Self Care | Payer: BC Managed Care – PPO | Attending: Internal Medicine | Admitting: Internal Medicine

## 2020-01-14 ENCOUNTER — Encounter (HOSPITAL_COMMUNITY): Payer: Self-pay | Admitting: Family Medicine

## 2020-01-14 DIAGNOSIS — F10939 Alcohol use, unspecified with withdrawal, unspecified: Secondary | ICD-10-CM | POA: Diagnosis present

## 2020-01-14 DIAGNOSIS — I10 Essential (primary) hypertension: Secondary | ICD-10-CM | POA: Diagnosis present

## 2020-01-14 DIAGNOSIS — Z825 Family history of asthma and other chronic lower respiratory diseases: Secondary | ICD-10-CM

## 2020-01-14 DIAGNOSIS — F10239 Alcohol dependence with withdrawal, unspecified: Secondary | ICD-10-CM | POA: Diagnosis present

## 2020-01-14 DIAGNOSIS — D72829 Elevated white blood cell count, unspecified: Secondary | ICD-10-CM

## 2020-01-14 DIAGNOSIS — R7989 Other specified abnormal findings of blood chemistry: Secondary | ICD-10-CM | POA: Diagnosis present

## 2020-01-14 DIAGNOSIS — R000 Tachycardia, unspecified: Secondary | ICD-10-CM | POA: Insufficient documentation

## 2020-01-14 DIAGNOSIS — Z811 Family history of alcohol abuse and dependence: Secondary | ICD-10-CM

## 2020-01-14 DIAGNOSIS — Z20822 Contact with and (suspected) exposure to covid-19: Secondary | ICD-10-CM | POA: Diagnosis present

## 2020-01-14 DIAGNOSIS — E871 Hypo-osmolality and hyponatremia: Secondary | ICD-10-CM | POA: Diagnosis present

## 2020-01-14 DIAGNOSIS — Z23 Encounter for immunization: Secondary | ICD-10-CM

## 2020-01-14 DIAGNOSIS — E860 Dehydration: Secondary | ICD-10-CM | POA: Diagnosis present

## 2020-01-14 DIAGNOSIS — N179 Acute kidney failure, unspecified: Principal | ICD-10-CM

## 2020-01-14 DIAGNOSIS — F418 Other specified anxiety disorders: Secondary | ICD-10-CM | POA: Diagnosis present

## 2020-01-14 HISTORY — DX: Anxiety disorder, unspecified: F41.9

## 2020-01-14 HISTORY — DX: Essential (primary) hypertension: I10

## 2020-01-14 LAB — CBC WITH DIFFERENTIAL/PLATELET
Abs Immature Granulocytes: 0.06 10*3/uL (ref 0.00–0.07)
Basophils Absolute: 0 10*3/uL (ref 0.0–0.1)
Basophils Relative: 0 %
Eosinophils Absolute: 0 10*3/uL (ref 0.0–0.5)
Eosinophils Relative: 0 %
HCT: 44.7 % (ref 39.0–52.0)
Hemoglobin: 15.9 g/dL (ref 13.0–17.0)
Immature Granulocytes: 0 %
Lymphocytes Relative: 7 %
Lymphs Abs: 1 10*3/uL (ref 0.7–4.0)
MCH: 30.7 pg (ref 26.0–34.0)
MCHC: 35.6 g/dL (ref 30.0–36.0)
MCV: 86.3 fL (ref 80.0–100.0)
Monocytes Absolute: 1.6 10*3/uL — ABNORMAL HIGH (ref 0.1–1.0)
Monocytes Relative: 11 %
Neutro Abs: 12.4 10*3/uL — ABNORMAL HIGH (ref 1.7–7.7)
Neutrophils Relative %: 82 %
Platelets: 298 10*3/uL (ref 150–400)
RBC: 5.18 MIL/uL (ref 4.22–5.81)
RDW: 12.8 % (ref 11.5–15.5)
WBC: 15.1 10*3/uL — ABNORMAL HIGH (ref 4.0–10.5)
nRBC: 0 % (ref 0.0–0.2)

## 2020-01-14 LAB — PROTIME-INR
INR: 1 (ref 0.8–1.2)
Prothrombin Time: 13.2 seconds (ref 11.4–15.2)

## 2020-01-14 LAB — COMPREHENSIVE METABOLIC PANEL
ALT: 168 U/L — ABNORMAL HIGH (ref 0–44)
AST: 82 U/L — ABNORMAL HIGH (ref 15–41)
Albumin: 5 g/dL (ref 3.5–5.0)
Alkaline Phosphatase: 61 U/L (ref 38–126)
Anion gap: 21 — ABNORMAL HIGH (ref 5–15)
BUN: 48 mg/dL — ABNORMAL HIGH (ref 6–20)
CO2: 24 mmol/L (ref 22–32)
Calcium: 9.3 mg/dL (ref 8.9–10.3)
Chloride: 86 mmol/L — ABNORMAL LOW (ref 98–111)
Creatinine, Ser: 2.77 mg/dL — ABNORMAL HIGH (ref 0.61–1.24)
GFR, Estimated: 27 mL/min — ABNORMAL LOW (ref >60)
Glucose, Bld: 145 mg/dL — ABNORMAL HIGH (ref 70–99)
Potassium: 4.3 mmol/L (ref 3.5–5.1)
Sodium: 131 mmol/L — ABNORMAL LOW (ref 135–145)
Total Bilirubin: 3.4 mg/dL — ABNORMAL HIGH (ref 0.3–1.2)
Total Protein: 8.3 g/dL — ABNORMAL HIGH (ref 6.5–8.1)

## 2020-01-14 LAB — RESP PANEL BY RT-PCR (FLU A&B, COVID) ARPGX2
Influenza A by PCR: NEGATIVE
Influenza B by PCR: NEGATIVE
SARS Coronavirus 2 by RT PCR: NEGATIVE

## 2020-01-14 MED ORDER — LORAZEPAM 2 MG/ML IJ SOLN
2.0000 mg | Freq: Once | INTRAMUSCULAR | Status: AC
  Administered 2020-01-14: 2 mg via INTRAVENOUS
  Filled 2020-01-14: qty 1

## 2020-01-14 MED ORDER — THIAMINE HCL 100 MG PO TABS
100.0000 mg | ORAL_TABLET | Freq: Once | ORAL | Status: AC
  Administered 2020-01-14: 100 mg via ORAL
  Filled 2020-01-14: qty 1

## 2020-01-14 MED ORDER — SODIUM CHLORIDE 0.9 % IV BOLUS
1000.0000 mL | Freq: Once | INTRAVENOUS | Status: AC
  Administered 2020-01-14: 1000 mL via INTRAVENOUS

## 2020-01-14 MED ORDER — LORAZEPAM 2 MG/ML IJ SOLN
2.0000 mg | Freq: Once | INTRAMUSCULAR | Status: AC
  Administered 2020-01-14: 2 mg via INTRAVENOUS

## 2020-01-14 MED ORDER — LORAZEPAM 2 MG/ML IJ SOLN
0.0000 mg | Freq: Four times a day (QID) | INTRAMUSCULAR | Status: DC
  Filled 2020-01-14: qty 1

## 2020-01-14 MED ORDER — LORAZEPAM 1 MG PO TABS: 0.0000 mg | ORAL_TABLET | Freq: Two times a day (BID) | ORAL | Status: DC

## 2020-01-14 MED ORDER — LORAZEPAM 1 MG PO TABS
0.0000 mg | ORAL_TABLET | Freq: Four times a day (QID) | ORAL | Status: DC
  Administered 2020-01-15: 2 mg via ORAL
  Administered 2020-01-15: 1 mg via ORAL
  Administered 2020-01-15: 2 mg via ORAL
  Administered 2020-01-15: 1 mg via ORAL
  Filled 2020-01-14: qty 2
  Filled 2020-01-14 (×2): qty 1

## 2020-01-14 MED ORDER — ONDANSETRON HCL 4 MG/2ML IJ SOLN
4.0000 mg | Freq: Once | INTRAMUSCULAR | Status: AC
  Administered 2020-01-14: 4 mg via INTRAVENOUS
  Filled 2020-01-14: qty 2

## 2020-01-14 MED ORDER — LORAZEPAM 2 MG/ML IJ SOLN: 0.0000 mg | Freq: Two times a day (BID) | INTRAMUSCULAR | Status: DC

## 2020-01-14 NOTE — ED Provider Notes (Signed)
[facility]-EMERGENCY DEPT Provider Note   CSN: [id number] Arrival date & time: 01/14/20  1746     History Chief Complaint  Patient presents with  . alcohol detox    Alex Logan is a 55 y.o. male.  Patient presents with concern for alcohol withdrawal.  He is a chronic alcoholic, trying to stop, last drink was yesterday around 2 PM.  Typically drinks several pints of vodka prior to that.  Denies any headache or chest pain abdominal pain.  Complains of some vomiting but no diarrhea.  No fever no cough.  Denies any visual or auditory hallucinations and no history of seizures.        Past Medical History:  Diagnosis Date  . Alcohol abuse   . Depression     Patient Active Problem List   Diagnosis Date Noted  . Elevated LFTs 05/27/2017  . Muscular abdominal pain in right flank 05/27/2017  . Fatty liver, alcoholic 05/27/2017  . Depression with anxiety 05/27/2017  . Pancytopenia (HCC) 05/27/2017  . SVT (supraventricular tachycardia) (HCC)   . HTN (hypertension) 05/23/2017  . Acute vomiting   . Pressure injury of skin 11/12/2016  . Alcohol withdrawal (HCC) 11/10/2016  . SIRS (systemic inflammatory response syndrome) (HCC) 11/10/2016  . High anion gap metabolic acidosis 11/10/2016  . Lactic acidosis 11/10/2016  . Hyponatremia 11/10/2016  . Starvation ketoacidosis 11/10/2016  . Alcohol use disorder, severe, dependence (HCC) 09/27/2016  . Alcohol abuse with alcohol-induced mood disorder (HCC) 09/22/2016    Past Surgical History:  Procedure Laterality Date  . NO PAST SURGERIES         Family History  Problem Relation Age of Onset  . Emphysema Mother   . Bipolar disorder Father   . Alcohol abuse Father   . Mental illness Neg Hx     Social History   Tobacco Use  . Smoking status: Never Smoker  . Smokeless tobacco: Never Used  Substance Use Topics  . Alcohol use: Yes    Comment: Chronic ETOH use and abuse  . Drug use: No    Home  Medications Prior to Admission medications   Medication Sig Start Date End Date Taking? Authorizing Provider  ibuprofen (ADVIL) 200 MG tablet Take 600 mg by mouth every 6 (six) hours as needed for mild pain.   Yes [provider]  escitalopram (LEXAPRO) 10 MG tablet Take 1 tablet (10 mg total) by mouth daily. Patient not taking: Reported on 01/14/2020 09/28/16   Oneta Rack, NP    Allergies    Patient has no known allergies.  Review of Systems   Review of Systems  Constitutional: Negative for fever.  HENT: Negative for ear pain and sore throat.   Eyes: Negative for pain.  Respiratory: Negative for cough.   Cardiovascular: Negative for chest pain.  Gastrointestinal: Negative for abdominal pain.  Genitourinary: Negative for flank pain.  Musculoskeletal: Negative for back pain.  Skin: Negative for color change and rash.  Neurological: Negative for syncope.  All other systems reviewed and are negative.   Physical Exam Updated Vital Signs BP (!) 153/116   Pulse (!) 124   Temp 98.2 F (36.8 C) (Oral)   Resp (!) 24   Ht 5\' 10"  (1.778 m)   Wt 80.7 kg   SpO2 92%   BMI 25.54 kg/m   Physical Exam Constitutional:      Appearance: He is well-developed.     Comments: Tremors, anxious appearing  HENT:  Head: Normocephalic.     Nose: Nose normal.  Eyes:     Extraocular Movements: Extraocular movements intact.  Cardiovascular:     Rate and Rhythm: Tachycardia present.  Pulmonary:     Effort: Pulmonary effort is normal.  Skin:    Coloration: Skin is not jaundiced.  Neurological:     General: No focal deficit present.     Mental Status: He is alert and oriented to person, place, and time. Mental status is at baseline.     ED Results / Procedures / Treatments   Labs (all labs ordered are listed, but only abnormal results are displayed) Labs Reviewed  COMPREHENSIVE METABOLIC PANEL - Abnormal; Notable for the following components:      Result Value   Sodium  131 (*)    Chloride 86 (*)    Glucose, Bld 145 (*)    BUN 48 (*)    Creatinine, Ser 2.77 (*)    Total Protein 8.3 (*)    AST 82 (*)    ALT 168 (*)    Total Bilirubin 3.4 (*)    GFR, Estimated 27 (*)    Anion gap 21 (*)    All other components within normal limits  CBC WITH DIFFERENTIAL/PLATELET - Abnormal; Notable for the following components:   WBC 15.1 (*)    Neutro Abs 12.4 (*)    Monocytes Absolute 1.6 (*)    All other components within normal limits  RESP PANEL BY RT-PCR (FLU A&B, COVID) ARPGX2  PROTIME-INR    EKG None  Radiology No results found.  Procedures .Critical Care Performed by: Luna Fuse, MD Authorized by: Luna Fuse, MD   Critical care provider statement:    Critical care time (minutes):  40   Critical care was necessary to treat or prevent imminent or life-threatening deterioration of the following conditions:  Cardiac failure Comments:     Severe tachycardia, acute alcohol withdrawal.   (including critical care time)  Medications Ordered in ED Medications  LORazepam (ATIVAN) injection 0-4 mg (has no administration in time range)    Or  LORazepam (ATIVAN) tablet 0-4 mg (has no administration in time range)  LORazepam (ATIVAN) injection 0-4 mg (has no administration in time range)    Or  LORazepam (ATIVAN) tablet 0-4 mg (has no administration in time range)  sodium chloride 0.9 % bolus 1,000 mL (0 mLs Intravenous Stopped 01/14/20 2242)  LORazepam (ATIVAN) injection 2 mg (2 mg Intravenous Given 01/14/20 2132)  thiamine tablet 100 mg (100 mg Oral Given 01/14/20 2132)  ondansetron (ZOFRAN) injection 4 mg (4 mg Intravenous Given 01/14/20 2148)  LORazepam (ATIVAN) injection 2 mg (2 mg Intravenous Given 01/14/20 2242)  sodium chloride 0.9 % bolus 1,000 mL (1,000 mLs Intravenous New Bag/Given 01/14/20 2310)    ED Course  I have reviewed the triage vital signs and the nursing notes.  Pertinent labs & imaging results that were available during my care of  the patient were reviewed by me and considered in my medical decision making (see chart for details).    MDM Rules/Calculators/A&P                          Work-up shows white count of 15 chemistry shows acute kidney injury creatinine 2.7 baseline of 1.  Patient given 2 L bolus of fluids and several doses of Ativan.  Remains tachycardic at rate of 122 bpm.  Will be admitted to the hospitalist team for acute  alcohol withdrawal and acute kidney injury.   Final Clinical Impression(s) / ED Diagnoses Final diagnoses:  Alcohol withdrawal syndrome with complication (HCC)  Acute kidney injury [facility])    Rx / DC Orders ED Discharge Orders    None       Cheryll Cockayne, MD 01/14/20 2313

## 2020-01-14 NOTE — ED Triage Notes (Signed)
Patient states he is trying to detox from alcohol. Patient has been drinking for the past 5 days, has a fifth of liquor per day the last 5 days or a container of boxed wine. Patient has the shakes, and is tachycardic.

## 2020-01-14 NOTE — H&P (Signed)
History and Physical    Alex Logan VHQ:[id number] DOB: 06-03-1965 DOA: 01/14/2020  PCP: [facility]   Patient coming from:   Home  Chief Complaint: racing heart rate, nausea and vomiting.  HPI: Alex Logan is a 55 y.o. male with medical history significant for heavy alcohol use presents with concern of alcohol withdrawal. Alex Logan reports he is a chronic alcoholic but is trying to stop. He states his last drink was yesterday around 2 PM.  Typically drinks several pints of vodka a day for the past few weeks. Yesterday he states he drank his usual vodka plus a box of wine. He denies any headache or chest pain, abdominal pain. He denies any LOC or head injury.  Complains of some nausea and vomiting but no diarrhea.  He has not had a fever or a cough.  Denies any visual or auditory hallucinations and no history of seizures. He states he has not been around anyone sick.  He states he has felt his heart racing but he has not had any chest pressure or pain.   ED Course: He is found to have elevated blood pressure as well as tachycardia in the emergency room.  He is found to have acute kidney injury on labs.  Review of Systems:  General: Denies weakness, fever, chills, weight loss, night sweats.  Denies dizziness.  Denies change in appetite HENT: Denies head trauma, headache, denies change in hearing, tinnitus.  Denies nasal congestion or bleeding.  Denies sore throat, sores in mouth.  Denies difficulty swallowing Eyes: Denies blurry vision, pain in eye, drainage.  Denies discoloration of eyes. Neck: Denies pain.  Denies swelling.  Denies pain with movement. Cardiovascular: Denies chest pain.  Denies edema.  Denies orthopnea Respiratory: Denies shortness of breath, cough.  Denies wheezing.  Denies sputum production Gastrointestinal: Denies abdominal pain, swelling.  Denies diarrhea.  Denies melena.  Denies hematemesis. Musculoskeletal: Denies limitation of movement.  Denies  deformity or swelling.  Denies pain.  Denies arthralgias or myalgias. Genitourinary: Denies pelvic pain.  Denies urinary frequency or hesitancy.  Denies dysuria.  Skin: Denies rash.  Denies petechiae, purpura, ecchymosis. Neurological: Denies headache.  Denies syncope.  Denies seizure activity.  Denies paresthesia.  Denies slurred speech, drooping face.  Denies visual change. Psychiatric:  Denies suicidal thoughts or ideation. Denies hallucinations.  Past Medical History:  Diagnosis Date  . Alcohol abuse   . Anxiety   . Depression   . Hypertension     Past Surgical History:  Procedure Laterality Date  . NO PAST SURGERIES      Social History  reports that he has never smoked. He has never used smokeless tobacco. He reports current alcohol use. He reports that he does not use drugs.  No Known Allergies  Family History  Problem Relation Age of Onset  . Emphysema Mother   . Bipolar disorder Father   . Alcohol abuse Father   . Mental illness Neg Hx      Prior to Admission medications   Medication Sig Start Date End Date Taking? Authorizing Provider  ibuprofen (ADVIL) 200 MG tablet Take 600 mg by mouth every 6 (six) hours as needed for mild pain.   Yes [provider]  escitalopram (LEXAPRO) 10 MG tablet Take 1 tablet (10 mg total) by mouth daily. Patient not taking: Reported on 01/14/2020 09/28/16   Oneta Rack, NP    Physical Exam: Vitals:   01/14/20 2130 01/14/20 2228 01/14/20 2230 01/14/20 2300  BP: Marland Kitchen)  166/148 (!) 154/107 (!) 168/121 (!) 153/116  Pulse: (!) 133 (!) 128 (!) 125 (!) 124  Resp: (!) 28 (!) 22 (!) 23 (!) 24  Temp:      TempSrc:      SpO2: 97% 91% 96% 92%  Weight:      Height:        Constitutional: NAD, calm, comfortable Vitals:   01/14/20 2130 01/14/20 2228 01/14/20 2230 01/14/20 2300  BP: (!) 166/148 (!) 154/107 (!) 168/121 (!) 153/116  Pulse: (!) 133 (!) 128 (!) 125 (!) 124  Resp: (!) 28 (!) 22 (!) 23 (!) 24  Temp:      TempSrc:       SpO2: 97% 91% 96% 92%  Weight:      Height:       General: WDWN, Alert and oriented x3.  Eyes: EOMI, PERRL,conjunctivae normal.  Sclera nonicteric HENT:  [place]/AT, external ears normal.  Nares patent without epistasis.  Mucous membranes are dry. Posterior pharynx clear of any exudate or lesions.  Neck: Soft, normal range of motion, supple, no masses, Trachea midline Respiratory: clear to auscultation bilaterally, no wheezing, no crackles. Normal respiratory effort. No accessory muscle use.  Cardiovascular:  Sinus tachycardia. PMI nondisplaced. no murmurs / rubs / gallops. No extremity edema. 2+ pedal pulses. Abdomen: Soft, no tenderness, nondistended, no rebound or guarding.  No masses palpated. No HSM palpated.  Bowel sounds normoactive Musculoskeletal: FROM. no cyanosis. No joint deformity upper and lower extremities. Normal muscle tone.  Skin: Warm, dry, intact no rashes, lesions, ulcers. No induration Neurologic: CN 2-12 grossly intact.  Normal speech.  Sensation intact, patella DTR +1 bilaterally. Strength 5/5 in all extremities.   Psychiatric: Normal judgment and insight.  Normal mood.    Labs on Admission: I have personally reviewed following labs and imaging studies  CBC: Recent Labs  Lab 01/14/20 2115  WBC 15.1*  NEUTROABS 12.4*  HGB 15.9  HCT 44.7  MCV 86.3  PLT 298    Basic Metabolic Panel: Recent Labs  Lab 01/14/20 2115  NA 131*  K 4.3  CL 86*  CO2 24  GLUCOSE 145*  BUN 48*  CREATININE 2.77*  CALCIUM 9.3    GFR: Estimated Creatinine Clearance: 31.8 mL/min (A) (by C-G formula based on SCr of 2.77 mg/dL (H)).  Liver Function Tests: Recent Labs  Lab 01/14/20 2115  AST 82*  ALT 168*  ALKPHOS 61  BILITOT 3.4*  PROT 8.3*  ALBUMIN 5.0    Urine analysis:    Component Value Date/Time   COLORURINE YELLOW 05/23/2017 1028   APPEARANCEUR CLEAR 05/23/2017 1028   LABSPEC 1.021 05/23/2017 1028   PHURINE 5.0 05/23/2017 1028   GLUCOSEU NEGATIVE 05/23/2017  1028   HGBUR SMALL (A) 05/23/2017 1028   BILIRUBINUR NEGATIVE 05/23/2017 1028   KETONESUR 20 (A) 05/23/2017 1028   PROTEINUR 100 (A) 05/23/2017 1028   UROBILINOGEN 0.2 12/06/2012 1725   NITRITE NEGATIVE 05/23/2017 1028   LEUKOCYTESUR NEGATIVE 05/23/2017 1028    Radiological Exams on Admission: No results found.  EKG: Independently reviewed.  EKG shows sinus tachycardia with no acute ST elevation or depression.  QTc 462  Assessment/Plan Principal Problem:   AKI (acute kidney injury) Alex Logan is admitted to telemetry floor.  Acute kidney injury secondary to vomiting and dehydration with alcohol withdrawal. IV fluid hydration with LR at_5 ml/hr overnight. Recheck electrolytes and renal function in morning.  Active Problems:   Alcohol withdrawal Placed on CIWA protocol.  Ativan provided for elevated CIWA  scores.  Patient started on Librium. Consult discharge planning in the morning to assist with finding alcohol rehab program for patient    HTN (hypertension) Patient with elevated blood pressure.  Will be given metoprolol IV for systolic blood pressure greater than 180.    Tachycardia Patient with sinus tachycardia and is hemodynamically stable.  Monitor on telemetry    Hyponatremia Patient with mild hyponatremia most likely secondary to alcohol abuse. IV fluid hydration overnight.  Recheck electrolytes in morning    Leukocytosis Mild elevated white blood cell count most likely secondary to left shift with vomiting with alcohol withdrawal.  Recheck CBC in morning    DVT prophylaxis: Padua score elevated.  Lovenox for DVT prophylaxis Code Status:   Full code Family Communication:  Diagnosis and plan discussed with patient.  Patient verbalized understanding agrees with plan.  Further recommendations to follow as clinically indicated Disposition Plan:   Patient is from:  Home  Anticipated DC to:  To be determined patient will go home or inpatient rehab for  alcohol  Anticipated DC date:  Anticipate greater than 2 midnight stay  Anticipated DC barriers: Barrier to discharge may be finding rehab bed and acceptance if it is required  Admission status:   Inpatient  Yevonne Aline Mardel Grudzien MD [facility]  How to contact the [facility] Attending or Consulting provider [place] or covering provider during after hours [place], for this patient?   1. Check the care team in [facility] and look for a) attending/consulting [facility] provider listed and b) the [facility] team listed 2. Log into [email or web address] and use [place]'s universal password to access. If you do not have the password, please contact the hospital operator. 3. Locate the [facility] provider you are looking for under [facility] and page to a number that you can be directly reached. 4. If you still have difficulty reaching the provider, please page the [facility] (Director on Call) for the Hospitalists listed on amion for assistance.  01/14/2020, 11:47 PM

## 2020-01-15 DIAGNOSIS — R7989 Other specified abnormal findings of blood chemistry: Secondary | ICD-10-CM

## 2020-01-15 DIAGNOSIS — F10230 Alcohol dependence with withdrawal, uncomplicated: Secondary | ICD-10-CM

## 2020-01-15 LAB — COMPREHENSIVE METABOLIC PANEL
ALT: 129 U/L — ABNORMAL HIGH (ref 0–44)
AST: 79 U/L — ABNORMAL HIGH (ref 15–41)
Albumin: 4 g/dL (ref 3.5–5.0)
Alkaline Phosphatase: 52 U/L (ref 38–126)
Anion gap: 11 (ref 5–15)
BUN: 36 mg/dL — ABNORMAL HIGH (ref 6–20)
CO2: 26 mmol/L (ref 22–32)
Calcium: 8.8 mg/dL — ABNORMAL LOW (ref 8.9–10.3)
Chloride: 96 mmol/L — ABNORMAL LOW (ref 98–111)
Creatinine, Ser: 1.73 mg/dL — ABNORMAL HIGH (ref 0.61–1.24)
GFR, Estimated: 47 mL/min — ABNORMAL LOW (ref >60)
Glucose, Bld: 129 mg/dL — ABNORMAL HIGH (ref 70–99)
Potassium: 3.5 mmol/L (ref 3.5–5.1)
Sodium: 133 mmol/L — ABNORMAL LOW (ref 135–145)
Total Bilirubin: 1.7 mg/dL — ABNORMAL HIGH (ref 0.3–1.2)
Total Protein: 6.8 g/dL (ref 6.5–8.1)

## 2020-01-15 LAB — CBC
HCT: 40.1 % (ref 39.0–52.0)
Hemoglobin: 13.9 g/dL (ref 13.0–17.0)
MCH: 31.2 pg (ref 26.0–34.0)
MCHC: 34.7 g/dL (ref 30.0–36.0)
MCV: 89.9 fL (ref 80.0–100.0)
Platelets: 222 10*3/uL (ref 150–400)
RBC: 4.46 MIL/uL (ref 4.22–5.81)
RDW: 13 % (ref 11.5–15.5)
WBC: 11.4 10*3/uL — ABNORMAL HIGH (ref 4.0–10.5)
nRBC: 0 % (ref 0.0–0.2)

## 2020-01-15 LAB — PHOSPHORUS: Phosphorus: 3.6 mg/dL (ref 2.5–4.6)

## 2020-01-15 LAB — HIV ANTIBODY (ROUTINE TESTING W REFLEX): HIV Screen 4th Generation wRfx: NONREACTIVE

## 2020-01-15 LAB — MAGNESIUM: Magnesium: 2.5 mg/dL — ABNORMAL HIGH (ref 1.7–2.4)

## 2020-01-15 MED ORDER — ONDANSETRON HCL 4 MG/2ML IJ SOLN
4.0000 mg | Freq: Four times a day (QID) | INTRAMUSCULAR | Status: DC | PRN
  Administered 2020-01-15 (×2): 4 mg via INTRAVENOUS
  Filled 2020-01-15 (×2): qty 2

## 2020-01-15 MED ORDER — ADULT MULTIVITAMIN W/MINERALS CH
1.0000 | ORAL_TABLET | Freq: Every day | ORAL | Status: DC
  Administered 2020-01-15 – 2020-01-17 (×3): 1 via ORAL
  Filled 2020-01-15 (×3): qty 1

## 2020-01-15 MED ORDER — METOPROLOL TARTRATE 5 MG/5ML IV SOLN: 5.0000 mg | Freq: Four times a day (QID) | INTRAVENOUS | Status: DC | PRN

## 2020-01-15 MED ORDER — LORAZEPAM 1 MG PO TABS
1.0000 mg | ORAL_TABLET | ORAL | Status: DC | PRN
  Administered 2020-01-16 (×2): 1 mg via ORAL
  Filled 2020-01-15: qty 1
  Filled 2020-01-15: qty 2
  Filled 2020-01-15: qty 1

## 2020-01-15 MED ORDER — ONDANSETRON HCL 4 MG PO TABS: 4.0000 mg | ORAL_TABLET | Freq: Four times a day (QID) | ORAL | Status: DC | PRN

## 2020-01-15 MED ORDER — INFLUENZA VAC SPLIT QUAD 0.5 ML IM SUSY
0.5000 mL | PREFILLED_SYRINGE | INTRAMUSCULAR | Status: AC
  Administered 2020-01-16: 0.5 mL via INTRAMUSCULAR
  Filled 2020-01-15: qty 0.5

## 2020-01-15 MED ORDER — ACETAMINOPHEN 650 MG RE SUPP: 650.0000 mg | Freq: Four times a day (QID) | RECTAL | Status: DC | PRN

## 2020-01-15 MED ORDER — SENNOSIDES-DOCUSATE SODIUM 8.6-50 MG PO TABS: 1.0000 | ORAL_TABLET | Freq: Every evening | ORAL | Status: DC | PRN

## 2020-01-15 MED ORDER — THIAMINE HCL 100 MG PO TABS
100.0000 mg | ORAL_TABLET | Freq: Every day | ORAL | Status: DC
  Administered 2020-01-15 – 2020-01-17 (×3): 100 mg via ORAL
  Filled 2020-01-15 (×3): qty 1

## 2020-01-15 MED ORDER — ACETAMINOPHEN 325 MG PO TABS: 650.0000 mg | ORAL_TABLET | Freq: Four times a day (QID) | ORAL | Status: DC | PRN

## 2020-01-15 MED ORDER — ESCITALOPRAM OXALATE 10 MG PO TABS
10.0000 mg | ORAL_TABLET | Freq: Every day | ORAL | Status: DC
  Administered 2020-01-15 – 2020-01-17 (×3): 10 mg via ORAL
  Filled 2020-01-15 (×3): qty 1

## 2020-01-15 MED ORDER — FOLIC ACID 1 MG PO TABS
1.0000 mg | ORAL_TABLET | Freq: Every day | ORAL | Status: DC
  Administered 2020-01-15 – 2020-01-17 (×3): 1 mg via ORAL
  Filled 2020-01-15 (×3): qty 1

## 2020-01-15 MED ORDER — SODIUM CHLORIDE 0.9 % IV SOLN
25.0000 mg | Freq: Three times a day (TID) | INTRAVENOUS | Status: DC | PRN
  Administered 2020-01-15: 25 mg via INTRAVENOUS
  Filled 2020-01-15 (×2): qty 1

## 2020-01-15 MED ORDER — HYDRALAZINE HCL 25 MG PO TABS
25.0000 mg | ORAL_TABLET | ORAL | Status: DC | PRN
  Administered 2020-01-16: 25 mg via ORAL
  Filled 2020-01-15: qty 1

## 2020-01-15 MED ORDER — LACTATED RINGERS IV SOLN: INTRAVENOUS | Status: AC

## 2020-01-15 MED ORDER — LABETALOL HCL 5 MG/ML IV SOLN
10.0000 mg | INTRAVENOUS | Status: DC | PRN
  Administered 2020-01-15: 10 mg via INTRAVENOUS
  Filled 2020-01-15 (×3): qty 4

## 2020-01-15 MED ORDER — LORAZEPAM 2 MG/ML IJ SOLN
1.0000 mg | INTRAMUSCULAR | Status: DC | PRN
  Administered 2020-01-16: 1 mg via INTRAVENOUS
  Administered 2020-01-16: 2 mg via INTRAVENOUS
  Filled 2020-01-15 (×2): qty 1

## 2020-01-15 MED ORDER — ENOXAPARIN SODIUM 40 MG/0.4ML ~~LOC~~ SOLN
40.0000 mg | SUBCUTANEOUS | Status: DC
  Administered 2020-01-15 – 2020-01-17 (×3): 40 mg via SUBCUTANEOUS
  Filled 2020-01-15 (×3): qty 0.4

## 2020-01-15 MED ORDER — CHLORDIAZEPOXIDE HCL 5 MG PO CAPS
10.0000 mg | ORAL_CAPSULE | Freq: Three times a day (TID) | ORAL | Status: DC
  Administered 2020-01-15 – 2020-01-17 (×7): 10 mg via ORAL
  Filled 2020-01-15 (×7): qty 2

## 2020-01-15 MED ORDER — THIAMINE HCL 100 MG/ML IJ SOLN
100.0000 mg | Freq: Every day | INTRAMUSCULAR | Status: DC
  Filled 2020-01-15 (×2): qty 2

## 2020-01-15 NOTE — Assessment & Plan Note (Signed)
-  Low suspicion for signs of infection at this time, likely reactive -Follow CBC

## 2020-01-15 NOTE — ED Notes (Addendum)
External male (purewick) attached to patient

## 2020-01-15 NOTE — Assessment & Plan Note (Addendum)
-  Considered due to alcohol use -Resolved with fluids

## 2020-01-15 NOTE — Assessment & Plan Note (Signed)
-  Labetalol and hydralazine as needed

## 2020-01-15 NOTE — ED Notes (Signed)
Patient received breakfast tray 

## 2020-01-15 NOTE — ED Notes (Signed)
Called report to [facility], Charity fundraiser

## 2020-01-15 NOTE — Hospital Course (Addendum)
Alex Logan is a 55  yo male with PMH alcohol abuse, HTN, depression/anxiety who presented to the hospital after excessive alcohol use at home.  He stated that he has been sober for approximately 2 months since his last episode.  This time he drank approximately 3 Fifths of vodka and a box of wine. He says he also goes to AA.   On work-up in the ER he was found to have elevated blood pressure, AKI, and was admitted for treatment as well as further monitoring for alcohol withdrawal. He was treated with Ativan as per CIWA protocol as well as placed on scheduled Librium.  His symptoms slowly improved and he was able to be discharged home with remaining Librium course to finish.  He will follow-up outpatient with ongoing AA for continued help with alcohol cessation.

## 2020-01-15 NOTE — Assessment & Plan Note (Addendum)
-  Check hepatitis panel = negative -AST/ALT pattern not typical for alcohol use at this time -Patient denies any illicit drug use or IV drugs

## 2020-01-15 NOTE — ED Notes (Signed)
Patient called out to use the bathroom/ Gave patient urinal

## 2020-01-15 NOTE — Assessment & Plan Note (Addendum)
-  Considered prerenal from recent alcohol use and probable poor p.o. intake -Resolved with fluids -BMP in a.m.

## 2020-01-15 NOTE — Assessment & Plan Note (Addendum)
-  Monitor on CIWA protocol; still scoring but starting to downtrend -TOC consult for substance abuse counseling -Patient follows with AA outpatient he says which he plans to continue doing so -Librium course prescribed at discharge to complete

## 2020-01-15 NOTE — Progress Notes (Signed)
PROGRESS NOTE    Alex Logan   JAS:[id number]  DOB: Dec 04, 1965  DOA: 01/14/2020     1  [facility]P: [facility]  CC: alcohol use  Hospital Course: Mr. Alex Logan is a 55  yo male with PMH alcohol abuse, HTN, depression/anxiety who presented to the hospital after excessive alcohol use at home.  He stated that he has been sober for approximately 2 months since his last episode.  This time he drank approximately 3 Fifths of vodka and a box of wine. He says he also goes to AA but wishes to talk to a counselor during this hospitalization as well.   On work-up in the ER he was found to have elevated blood pressure, AKI, and was admitted for treatment as well as further monitoring for alcohol withdrawal.   Interval History:  Seen in ER this am. Endorsed relapsing again of alcohol and seeking help getting thru detox.  He had ongoing hiccups when seen and still some nausea.  Also having slight tremor in his hands.  Mood feels relatively normal, no significant agitation.  Old records reviewed in assessment of this patient  ROS: Constitutional: negative for chills and fevers, Respiratory: negative for cough, Cardiovascular: negative for chest pain and Gastrointestinal: positive for nausea  Assessment & Plan: * AKI (acute kidney injury) (HCC) -Considered prerenal from recent alcohol use and probable poor p.o. intake -Continue fluids -BMP in a.m.  Alcohol withdrawal (HCC) -Monitor on CIWA protocol -TOC consult for substance abuse counseling -Patient follows with AA outpatient he says which he plans to continue doing so  HTN (hypertension) -Labetalol and hydralazine as needed  Elevated LFTs -Check hepatitis panel -AST/ALT pattern not typical for alcohol use at this time -Patient denies any illicit drug use or IV drugs  Leukocytosis -Low suspicion for signs of infection at this time, likely reactive -Follow CBC  Hyponatremia -Considered due to alcohol use -Continue  fluids -BMP in a.m.    Antimicrobials: N/A  DVT prophylaxis: Lovenox Code Status: Full Family Communication: None present Disposition Plan: Status is: Inpatient  Remains inpatient appropriate because:Unsafe d/c plan, IV treatments appropriate due to intensity of illness or inability to take PO and Inpatient level of care appropriate due to severity of illness   Dispo: The patient is from: Home              Anticipated d/c is to: Home              Anticipated d/c date is: 3 days              Patient currently is not medically stable to d/c.  Objective: Blood pressure (!) 148/111, pulse (!) 121, temperature 98.2 F (36.8 C), temperature source Oral, resp. rate (!) 23, height 5\' 10"  (1.778 m), weight 80.7 kg, SpO2 96 %.  Examination: General appearance: Adult man sitting up in bed in no obvious distress but appears tired and fatigued Head: Normocephalic, without obvious abnormality, atraumatic Eyes: EOMI, subtle scleral icterus Lungs: clear to auscultation bilaterally Heart: regular rate and rhythm and S1, S2 normal Abdomen: normal findings: bowel sounds normal and soft, non-tender Extremities: No edema Skin: mobility and turgor normal Neurologic: Grossly normal.  Subtle tremor in hands  Consultants:     Procedures:     Data Reviewed: I have personally reviewed following labs and imaging studies Results for orders placed or performed during the hospital encounter of 01/14/20 (from the past 24 hour(s))  Resp Panel by RT-PCR (Flu A&B, Covid) Nasopharyngeal Swab  Status: None   Collection Time: 01/14/20  8:43 PM   Specimen: Nasopharyngeal Swab; Nasopharyngeal(NP) swabs in vial transport medium  Result Value Ref Range   SARS Coronavirus 2 by RT PCR NEGATIVE NEGATIVE   Influenza A by PCR NEGATIVE NEGATIVE   Influenza B by PCR NEGATIVE NEGATIVE  Comprehensive metabolic panel     Status: Abnormal   Collection Time: 01/14/20  9:15 PM  Result Value Ref Range   Sodium  131 (L) 135 - 145 mmol/L   Potassium 4.3 3.5 - 5.1 mmol/L   Chloride 86 (L) 98 - 111 mmol/L   CO2 24 22 - 32 mmol/L   Glucose, Bld 145 (H) 70 - 99 mg/dL   BUN 48 (H) 6 - 20 mg/dL   Creatinine, Ser 1.76 (H) 0.61 - 1.24 mg/dL   Calcium 9.3 8.9 - 16.0 mg/dL   Total Protein 8.3 (H) 6.5 - 8.1 g/dL   Albumin 5.0 3.5 - 5.0 g/dL   AST 82 (H) 15 - 41 U/L   ALT 168 (H) 0 - 44 U/L   Alkaline Phosphatase 61 38 - 126 U/L   Total Bilirubin 3.4 (H) 0.3 - 1.2 mg/dL   GFR, Estimated 27 (L) >60 mL/min   Anion gap 21 (H) 5 - 15  CBC with Differential     Status: Abnormal   Collection Time: 01/14/20  9:15 PM  Result Value Ref Range   WBC 15.1 (H) 4.0 - 10.5 K/uL   RBC 5.18 4.22 - 5.81 MIL/uL   Hemoglobin 15.9 13.0 - 17.0 g/dL   HCT 73.7 10.6 - 26.9 %   MCV 86.3 80.0 - 100.0 fL   MCH 30.7 26.0 - 34.0 pg   MCHC 35.6 30.0 - 36.0 g/dL   RDW 48.5 46.2 - 70.3 %   Platelets 298 150 - 400 K/uL   nRBC 0.0 0.0 - 0.2 %   Neutrophils Relative % 82 %   Neutro Abs 12.4 (H) 1.7 - 7.7 K/uL   Lymphocytes Relative 7 %   Lymphs Abs 1.0 0.7 - 4.0 K/uL   Monocytes Relative 11 %   Monocytes Absolute 1.6 (H) 0.1 - 1.0 K/uL   Eosinophils Relative 0 %   Eosinophils Absolute 0.0 0.0 - 0.5 K/uL   Basophils Relative 0 %   Basophils Absolute 0.0 0.0 - 0.1 K/uL   Immature Granulocytes 0 %   Abs Immature Granulocytes 0.06 0.00 - 0.07 K/uL  Protime-INR     Status: None   Collection Time: 01/14/20  9:15 PM  Result Value Ref Range   Prothrombin Time 13.2 11.4 - 15.2 seconds   INR 1.0 0.8 - 1.2  Magnesium     Status: Abnormal   Collection Time: 01/15/20  6:44 AM  Result Value Ref Range   Magnesium 2.5 (H) 1.7 - 2.4 mg/dL  Phosphorus     Status: None   Collection Time: 01/15/20  6:44 AM  Result Value Ref Range   Phosphorus 3.6 2.5 - 4.6 mg/dL  HIV Antibody (routine testing w rflx)     Status: None   Collection Time: 01/15/20  6:44 AM  Result Value Ref Range   HIV Screen 4th Generation wRfx Non Reactive Non Reactive   CBC     Status: Abnormal   Collection Time: 01/15/20  6:44 AM  Result Value Ref Range   WBC 11.4 (H) 4.0 - 10.5 K/uL   RBC 4.46 4.22 - 5.81 MIL/uL   Hemoglobin 13.9 13.0 - 17.0 g/dL   HCT  40.1 39.0 - 52.0 %   MCV 89.9 80.0 - 100.0 fL   MCH 31.2 26.0 - 34.0 pg   MCHC 34.7 30.0 - 36.0 g/dL   RDW 19.3 79.0 - 24.0 %   Platelets 222 150 - 400 K/uL   nRBC 0.0 0.0 - 0.2 %  Comprehensive metabolic panel     Status: Abnormal   Collection Time: 01/15/20  6:44 AM  Result Value Ref Range   Sodium 133 (L) 135 - 145 mmol/L   Potassium 3.5 3.5 - 5.1 mmol/L   Chloride 96 (L) 98 - 111 mmol/L   CO2 26 22 - 32 mmol/L   Glucose, Bld 129 (H) 70 - 99 mg/dL   BUN 36 (H) 6 - 20 mg/dL   Creatinine, Ser 9.73 (H) 0.61 - 1.24 mg/dL   Calcium 8.8 (L) 8.9 - 10.3 mg/dL   Total Protein 6.8 6.5 - 8.1 g/dL   Albumin 4.0 3.5 - 5.0 g/dL   AST 79 (H) 15 - 41 U/L   ALT 129 (H) 0 - 44 U/L   Alkaline Phosphatase 52 38 - 126 U/L   Total Bilirubin 1.7 (H) 0.3 - 1.2 mg/dL   GFR, Estimated 47 (L) >60 mL/min   Anion gap 11 5 - 15    Recent Results (from the past 240 hour(s))  Resp Panel by RT-PCR (Flu A&B, Covid) Nasopharyngeal Swab     Status: None   Collection Time: 01/14/20  8:43 PM   Specimen: Nasopharyngeal Swab; Nasopharyngeal(NP) swabs in vial transport medium  Result Value Ref Range Status   SARS Coronavirus 2 by RT PCR NEGATIVE NEGATIVE Final    Comment: (NOTE) SARS-CoV-2 target nucleic acids are NOT DETECTED.  The SARS-CoV-2 RNA is generally detectable in upper respiratory specimens during the acute phase of infection. The lowest concentration of SARS-CoV-2 viral copies this assay can detect is 138 copies/mL. A negative result does not preclude SARS-Cov-2 infection and should not be used as the sole basis for treatment or other patient management decisions. A negative result may occur with  improper specimen collection/handling, submission of specimen other than nasopharyngeal swab, presence of viral  mutation(s) within the areas targeted by this assay, and inadequate number of viral copies(<138 copies/mL). A negative result must be combined with clinical observations, patient history, and epidemiological information. The expected result is Negative.  Fact Sheet for Patients:  BloggerCourse.com  Fact Sheet for Healthcare Providers:  SeriousBroker.it  This test is no t yet approved or cleared by the [place] FDA and  has been authorized for detection and/or diagnosis of SARS-CoV-2 by FDA under an Emergency Use Authorization (EUA). This EUA will remain  in effect (meaning this test can be used) for the duration of the COVID-19 declaration under Section 564(b)(1) of the Act, 21 U.S.C.section 360bbb-3(b)(1), unless the authorization is terminated  or revoked sooner.       Influenza A by PCR NEGATIVE NEGATIVE Final   Influenza B by PCR NEGATIVE NEGATIVE Final    Comment: (NOTE) The Xpert Xpress SARS-CoV-2/FLU/RSV plus assay is intended as an aid in the diagnosis of influenza from Nasopharyngeal swab specimens and should not be used as a sole basis for treatment. Nasal washings and aspirates are unacceptable for Xpert Xpress SARS-CoV-2/FLU/RSV testing.  Fact Sheet for Patients: BloggerCourse.com  Fact Sheet for Healthcare Providers: SeriousBroker.it  This test is not yet approved or cleared by the [place] FDA and has been authorized for detection and/or diagnosis of SARS-CoV-2 by FDA under an Emergency  Use Authorization (EUA). This EUA will remain in effect (meaning this test can be used) for the duration of the COVID-19 declaration under Section 564(b)(1) of the Act, 21 U.S.C. section 360bbb-3(b)(1), unless the authorization is terminated or revoked.  Performed at [facility], [place] [address] [place], [place] [address]      Radiology  Studies: No results found. No orders to display    Scheduled Meds: . chlordiazePOXIDE  10 mg Oral TID  . enoxaparin (LOVENOX) injection  40 mg Subcutaneous Q24H  . escitalopram  10 mg Oral Daily  . folic acid  1 mg Oral Daily  . [START ON 01/16/2020] influenza vac split quadrivalent PF  0.5 mL Intramuscular Tomorrow-1000  . LORazepam  0-4 mg Intravenous Q6H   Or  . LORazepam  0-4 mg Oral Q6H  . [START ON 01/17/2020] LORazepam  0-4 mg Intravenous Q12H   Or  . [START ON 01/17/2020] LORazepam  0-4 mg Oral Q12H  . multivitamin with minerals  1 tablet Oral Daily  . thiamine  100 mg Oral Daily   Or  . thiamine  100 mg Intravenous Daily   PRN Meds: acetaminophen **OR** acetaminophen, hydrALAZINE, labetalol, LORazepam **OR** LORazepam, ondansetron **OR** ondansetron (ZOFRAN) IV, senna-docusate Continuous Infusions: . lactated ringers 125 mL/hr at 01/15/20 0645     LOS: 1 day  Time spent: Greater than 50% of the 35 minute visit was spent in counseling/coordination of care for the patient as laid out in the A&P.   Dwyane Dee, MD [facility] 01/15/2020, 2:48 PM

## 2020-01-16 LAB — HEPATITIS PANEL, ACUTE
HCV Ab: NONREACTIVE
Hep A IgM: NONREACTIVE
Hep B C IgM: NONREACTIVE
Hepatitis B Surface Ag: NONREACTIVE

## 2020-01-16 LAB — CBC WITH DIFFERENTIAL/PLATELET
Abs Immature Granulocytes: 0.01 10*3/uL (ref 0.00–0.07)
Basophils Absolute: 0 10*3/uL (ref 0.0–0.1)
Basophils Relative: 0 %
Eosinophils Absolute: 0.1 10*3/uL (ref 0.0–0.5)
Eosinophils Relative: 1 %
HCT: 37.2 % — ABNORMAL LOW (ref 39.0–52.0)
Hemoglobin: 12.6 g/dL — ABNORMAL LOW (ref 13.0–17.0)
Immature Granulocytes: 0 %
Lymphocytes Relative: 20 %
Lymphs Abs: 1.4 10*3/uL (ref 0.7–4.0)
MCH: 31.8 pg (ref 26.0–34.0)
MCHC: 33.9 g/dL (ref 30.0–36.0)
MCV: 93.9 fL (ref 80.0–100.0)
Monocytes Absolute: 0.7 10*3/uL (ref 0.1–1.0)
Monocytes Relative: 10 %
Neutro Abs: 4.8 10*3/uL (ref 1.7–7.7)
Neutrophils Relative %: 69 %
Platelets: 147 10*3/uL — ABNORMAL LOW (ref 150–400)
RBC: 3.96 MIL/uL — ABNORMAL LOW (ref 4.22–5.81)
RDW: 12.5 % (ref 11.5–15.5)
WBC: 7 10*3/uL (ref 4.0–10.5)
nRBC: 0 % (ref 0.0–0.2)

## 2020-01-16 LAB — BASIC METABOLIC PANEL
Anion gap: 9 (ref 5–15)
BUN: 17 mg/dL (ref 6–20)
CO2: 26 mmol/L (ref 22–32)
Calcium: 8.4 mg/dL — ABNORMAL LOW (ref 8.9–10.3)
Chloride: 100 mmol/L (ref 98–111)
Creatinine, Ser: 0.95 mg/dL (ref 0.61–1.24)
GFR, Estimated: >60 mL/min (ref >60)
Glucose, Bld: 97 mg/dL (ref 70–99)
Potassium: 3.3 mmol/L — ABNORMAL LOW (ref 3.5–5.1)
Sodium: 135 mmol/L (ref 135–145)

## 2020-01-16 LAB — MAGNESIUM: Magnesium: 2.1 mg/dL (ref 1.7–2.4)

## 2020-01-16 LAB — PHOSPHORUS: Phosphorus: 3.4 mg/dL (ref 2.5–4.6)

## 2020-01-16 MED ORDER — DM-GUAIFENESIN ER 30-600 MG PO TB12
1.0000 | ORAL_TABLET | Freq: Two times a day (BID) | ORAL | Status: DC
  Administered 2020-01-16 – 2020-01-17 (×3): 1 via ORAL
  Filled 2020-01-16 (×3): qty 1

## 2020-01-16 MED ORDER — POTASSIUM CHLORIDE CRYS ER 20 MEQ PO TBCR
40.0000 meq | EXTENDED_RELEASE_TABLET | Freq: Once | ORAL | Status: AC
  Administered 2020-01-16: 40 meq via ORAL
  Filled 2020-01-16: qty 2

## 2020-01-16 MED ORDER — CALCIUM CARBONATE ANTACID 500 MG PO CHEW
1.0000 | CHEWABLE_TABLET | Freq: Three times a day (TID) | ORAL | Status: DC
  Administered 2020-01-16 – 2020-01-17 (×4): 200 mg via ORAL
  Filled 2020-01-16 (×4): qty 1

## 2020-01-16 MED ORDER — PANTOPRAZOLE SODIUM 40 MG PO TBEC
40.0000 mg | DELAYED_RELEASE_TABLET | Freq: Every day | ORAL | Status: DC
  Administered 2020-01-16 – 2020-01-17 (×2): 40 mg via ORAL
  Filled 2020-01-16 (×2): qty 1

## 2020-01-16 NOTE — Progress Notes (Signed)
PROGRESS NOTE    Alex Logan   IRS:[id number]  DOB: 07-May-1965  DOA: 01/14/2020     2  [facility]  CC: alcohol use  Hospital Course: Mr. Alex Logan is a 55  yo male with PMH alcohol abuse, HTN, depression/anxiety who presented to the hospital after excessive alcohol use at home.  He stated that he has been sober for approximately 2 months since his last episode.  This time he drank approximately 3 Fifths of vodka and a box of wine. He says he also goes to [facility] but wishes to talk to a counselor during this hospitalization as well.   On work-up in the ER he was found to have elevated blood pressure, AKI, and was admitted for treatment as well as further monitoring for alcohol withdrawal.   Interval History:  No major events overnight.  Still having withdrawal symptoms, appetite remains decreased and keeping him on fluids 1 more day while ongoing monitoring for further withdrawal and treatment as needed with Ativan.  Old records reviewed in assessment of this patient  ROS: Constitutional: negative for chills and fevers, Respiratory: negative for cough, Cardiovascular: negative for chest pain and Gastrointestinal: positive for nausea  Assessment & Plan: * AKI (acute kidney injury) (HCC)-resolved as of 01/16/2020 -Considered prerenal from recent alcohol use and probable poor p.o. intake -Resolved with fluids -BMP in a.m.  Alcohol withdrawal (Time) -Monitor on CIWA protocol; still scoring but starting to downtrend -TOC consult for substance abuse counseling -Patient follows with AA outpatient he says which he plans to continue doing so  HTN (hypertension) -Labetalol and hydralazine as needed  Elevated LFTs -Check hepatitis panel = negative -AST/ALT pattern not typical for alcohol use at this time -Patient denies any illicit drug use or IV drugs  Leukocytosis-resolved as of 01/16/2020 -Low suspicion for signs of infection at this time, likely reactive -Follow  CBC  Hyponatremia-resolved as of 01/16/2020 -Considered due to alcohol use -Resolved with fluids -Continue fluids -BMP in a.m.   Antimicrobials: N/A  DVT prophylaxis: Lovenox Code Status: Full Family Communication: None present Disposition Plan: Status is: Inpatient  Remains inpatient appropriate because:Unsafe d/c plan, IV treatments appropriate due to intensity of illness or inability to take PO and Inpatient level of care appropriate due to severity of illness   Dispo: The patient is from: Home              Anticipated d/c is to: Home              Anticipated d/c date is: 1 day              Patient currently is not medically stable to d/c.  Objective: Blood pressure (!) 139/94, pulse (!) 113, temperature 97.9 F (36.6 C), temperature source Oral, resp. rate 16, height 5\' 10"  (1.778 m), weight 80.7 kg, SpO2 99 %.  Examination: General appearance: Adult man sitting up in bed in no obvious distress but appears tired and fatigued Head: Normocephalic, without obvious abnormality, atraumatic Eyes: EOMI, subtle scleral icterus Lungs: clear to auscultation bilaterally Heart: regular rate and rhythm and S1, S2 normal Abdomen: normal findings: bowel sounds normal and soft, non-tender Extremities: No edema Skin: mobility and turgor normal Neurologic: Grossly normal.  Subtle tremor in hands  Consultants:     Procedures:     Data Reviewed: I have personally reviewed following labs and imaging studies Results for orders placed or performed during the hospital encounter of 01/14/20 (from the past 24 hour(s))  Hepatitis  panel, acute     Status: None   Collection Time: 01/15/20  2:32 PM  Result Value Ref Range   Hepatitis B Surface Ag NON REACTIVE NON REACTIVE   HCV Ab NON REACTIVE NON REACTIVE   Hep A IgM NON REACTIVE NON REACTIVE   Hep B C IgM NON REACTIVE NON REACTIVE  Basic metabolic panel     Status: Abnormal   Collection Time: 01/16/20  6:56 AM  Result Value Ref Range    Sodium 135 135 - 145 mmol/L   Potassium 3.3 (L) 3.5 - 5.1 mmol/L   Chloride 100 98 - 111 mmol/L   CO2 26 22 - 32 mmol/L   Glucose, Bld 97 70 - 99 mg/dL   BUN 17 6 - 20 mg/dL   Creatinine, Ser 5.40 0.61 - 1.24 mg/dL   Calcium 8.4 (L) 8.9 - 10.3 mg/dL   GFR, Estimated >08 >67 mL/min   Anion gap 9 5 - 15  CBC with Differential/Platelet     Status: Abnormal   Collection Time: 01/16/20  6:56 AM  Result Value Ref Range   WBC 7.0 4.0 - 10.5 K/uL   RBC 3.96 (L) 4.22 - 5.81 MIL/uL   Hemoglobin 12.6 (L) 13.0 - 17.0 g/dL   HCT 61.9 (L) 50.9 - 32.6 %   MCV 93.9 80.0 - 100.0 fL   MCH 31.8 26.0 - 34.0 pg   MCHC 33.9 30.0 - 36.0 g/dL   RDW 71.2 45.8 - 09.9 %   Platelets 147 (L) 150 - 400 K/uL   nRBC 0.0 0.0 - 0.2 %   Neutrophils Relative % 69 %   Neutro Abs 4.8 1.7 - 7.7 K/uL   Lymphocytes Relative 20 %   Lymphs Abs 1.4 0.7 - 4.0 K/uL   Monocytes Relative 10 %   Monocytes Absolute 0.7 0.1 - 1.0 K/uL   Eosinophils Relative 1 %   Eosinophils Absolute 0.1 0.0 - 0.5 K/uL   Basophils Relative 0 %   Basophils Absolute 0.0 0.0 - 0.1 K/uL   Immature Granulocytes 0 %   Abs Immature Granulocytes 0.01 0.00 - 0.07 K/uL  Magnesium     Status: None   Collection Time: 01/16/20  6:56 AM  Result Value Ref Range   Magnesium 2.1 1.7 - 2.4 mg/dL  Phosphorus     Status: None   Collection Time: 01/16/20  6:56 AM  Result Value Ref Range   Phosphorus 3.4 2.5 - 4.6 mg/dL    Recent Results (from the past 240 hour(s))  Resp Panel by RT-PCR (Flu A&B, Covid) Nasopharyngeal Swab     Status: None   Collection Time: 01/14/20  8:43 PM   Specimen: Nasopharyngeal Swab; Nasopharyngeal(NP) swabs in vial transport medium  Result Value Ref Range Status   SARS Coronavirus 2 by RT PCR NEGATIVE NEGATIVE Final    Comment: (NOTE) SARS-CoV-2 target nucleic acids are NOT DETECTED.  The SARS-CoV-2 RNA is generally detectable in upper respiratory specimens during the acute phase of infection. The lowest concentration of  SARS-CoV-2 viral copies this assay can detect is 138 copies/mL. A negative result does not preclude SARS-Cov-2 infection and should not be used as the sole basis for treatment or other patient management decisions. A negative result may occur with  improper specimen collection/handling, submission of specimen other than nasopharyngeal swab, presence of viral mutation(s) within the areas targeted by this assay, and inadequate number of viral copies(<138 copies/mL). A negative result must be combined with clinical observations, patient history, and epidemiological  information. The expected result is Negative.  Fact Sheet for Patients:  BloggerCourse.com  Fact Sheet for Healthcare Providers:  SeriousBroker.it  This test is no t yet approved or cleared by the [place] FDA and  has been authorized for detection and/or diagnosis of SARS-CoV-2 by FDA under an Emergency Use Authorization (EUA). This EUA will remain  in effect (meaning this test can be used) for the duration of the COVID-19 declaration under Section 564(b)(1) of the Act, 21 U.S.C.section 360bbb-3(b)(1), unless the authorization is terminated  or revoked sooner.       Influenza A by PCR NEGATIVE NEGATIVE Final   Influenza B by PCR NEGATIVE NEGATIVE Final    Comment: (NOTE) The Xpert Xpress SARS-CoV-2/FLU/RSV plus assay is intended as an aid in the diagnosis of influenza from Nasopharyngeal swab specimens and should not be used as a sole basis for treatment. Nasal washings and aspirates are unacceptable for Xpert Xpress SARS-CoV-2/FLU/RSV testing.  Fact Sheet for Patients: BloggerCourse.com  Fact Sheet for Healthcare Providers: SeriousBroker.it  This test is not yet approved or cleared by the [place] FDA and has been authorized for detection and/or diagnosis of SARS-CoV-2 by FDA under an Emergency Use  Authorization (EUA). This EUA will remain in effect (meaning this test can be used) for the duration of the COVID-19 declaration under Section 564(b)(1) of the Act, 21 U.S.C. section 360bbb-3(b)(1), unless the authorization is terminated or revoked.  Performed at [facility], [address], [place], [place] [address]      Radiology Studies: No results found. No orders to display    Scheduled Meds: . calcium carbonate  1 tablet Oral TID WC  . chlordiazePOXIDE  10 mg Oral TID  . dextromethorphan-guaiFENesin  1 tablet Oral BID  . enoxaparin (LOVENOX) injection  40 mg Subcutaneous Q24H  . escitalopram  10 mg Oral Daily  . folic acid  1 mg Oral Daily  . multivitamin with minerals  1 tablet Oral Daily  . pantoprazole  40 mg Oral Daily  . thiamine  100 mg Oral Daily   Or  . thiamine  100 mg Intravenous Daily   PRN Meds: acetaminophen **OR** acetaminophen, hydrALAZINE, labetalol, LORazepam **OR** LORazepam, ondansetron **OR** ondansetron (ZOFRAN) IV, senna-docusate Continuous Infusions:    LOS: 2 days  Time spent: Greater than 50% of the 35 minute visit was spent in counseling/coordination of care for the patient as laid out in the A&P.   Lewie Chamber, MD [facility] 01/16/2020, 12:07 PM

## 2020-01-17 LAB — CBC WITH DIFFERENTIAL/PLATELET
Abs Immature Granulocytes: 0.02 10*3/uL (ref 0.00–0.07)
Basophils Absolute: 0 10*3/uL (ref 0.0–0.1)
Basophils Relative: 1 %
Eosinophils Absolute: 0.1 10*3/uL (ref 0.0–0.5)
Eosinophils Relative: 2 %
HCT: 39.8 % (ref 39.0–52.0)
Hemoglobin: 13.4 g/dL (ref 13.0–17.0)
Immature Granulocytes: 0 %
Lymphocytes Relative: 24 %
Lymphs Abs: 1.6 10*3/uL (ref 0.7–4.0)
MCH: 31.2 pg (ref 26.0–34.0)
MCHC: 33.7 g/dL (ref 30.0–36.0)
MCV: 92.6 fL (ref 80.0–100.0)
Monocytes Absolute: 0.8 10*3/uL (ref 0.1–1.0)
Monocytes Relative: 12 %
Neutro Abs: 4 10*3/uL (ref 1.7–7.7)
Neutrophils Relative %: 61 %
Platelets: 151 10*3/uL (ref 150–400)
RBC: 4.3 MIL/uL (ref 4.22–5.81)
RDW: 12.3 % (ref 11.5–15.5)
WBC: 6.5 10*3/uL (ref 4.0–10.5)
nRBC: 0 % (ref 0.0–0.2)

## 2020-01-17 LAB — BASIC METABOLIC PANEL
Anion gap: 9 (ref 5–15)
BUN: 16 mg/dL (ref 6–20)
CO2: 26 mmol/L (ref 22–32)
Calcium: 9 mg/dL (ref 8.9–10.3)
Chloride: 99 mmol/L (ref 98–111)
Creatinine, Ser: 0.89 mg/dL (ref 0.61–1.24)
GFR, Estimated: >60 mL/min (ref >60)
Glucose, Bld: 88 mg/dL (ref 70–99)
Potassium: 3.9 mmol/L (ref 3.5–5.1)
Sodium: 134 mmol/L — ABNORMAL LOW (ref 135–145)

## 2020-01-17 LAB — MAGNESIUM: Magnesium: 2 mg/dL (ref 1.7–2.4)

## 2020-01-17 LAB — PHOSPHORUS: Phosphorus: 4 mg/dL (ref 2.5–4.6)

## 2020-01-17 MED ORDER — ADULT MULTIVITAMIN W/MINERALS CH: 1.0000 | ORAL_TABLET | Freq: Every day | ORAL | Status: DC

## 2020-01-17 MED ORDER — ESCITALOPRAM OXALATE 10 MG PO TABS: 10.0000 mg | ORAL_TABLET | Freq: Every day | ORAL | 2 refills | Status: DC

## 2020-01-17 MED ORDER — CHLORDIAZEPOXIDE HCL 10 MG PO CAPS: 10.0000 mg | ORAL_CAPSULE | Freq: Three times a day (TID) | ORAL | 0 refills | Status: AC

## 2020-01-17 NOTE — TOC Progression Note (Signed)
Transition of Care [facility]) - Progression Note    Patient Details  Name: Alex Logan MRN: [id number] Date of Birth: 1965/03/22  [facility] of [facility]) CM/SW Contact  Armanda Heritage, RN Phone Number: 01/17/2020, 1:44 PM  Clinical Narrative:    Cab voucher provided.        Expected Discharge Plan and Services           Expected Discharge Date: 01/17/20                                     Social Determinants of Health (SDOH) Interventions    Readmission Risk Interventions No flowsheet data found.

## 2020-01-17 NOTE — Discharge Summary (Signed)
Physician Discharge Summary   Alex AlbinoMichael H Logan WUJ:[id number] DOB: 1965-03-13 DOA: 01/14/2020  PCP: [facility]  Admit date: 01/14/2020 Discharge date: 01/17/2020  Admitted From: home Disposition:  home Discharging physician: Lewie Chamberavid Joseff Luckman, MD  Recommendations for Outpatient Follow-up:  1. Encourage ongoing alcohol cessation  Patient discharged to home in Discharge Condition: stable CODE STATUS: Full Diet recommendation:  Diet Orders (From admission, onward)    Start     Ordered   01/17/20 0000  Diet general        01/17/20 0919   01/15/20 0632  Diet Heart Room service appropriate? Yes; Fluid consistency: Thin  Diet effective now       Question Answer Comment  Room service appropriate? Yes   Fluid consistency: Thin      01/15/20 0631          Hospital Course: Alex Logan is a 55  yo male with PMH alcohol abuse, HTN, depression/anxiety who presented to the hospital after excessive alcohol use at home.  He stated that he has been sober for approximately 2 months since his last episode.  This time he drank approximately 3 Fifths of vodka and a box of wine. He says he also goes to AA.   On work-up in the ER he was found to have elevated blood pressure, AKI, and was admitted for treatment as well as further monitoring for alcohol withdrawal. He was treated with Ativan as per CIWA protocol as well as placed on scheduled Librium.  His symptoms slowly improved and he was able to be discharged home with remaining Librium course to finish.  He will follow-up outpatient with ongoing AA for continued help with alcohol cessation.   * AKI (acute kidney injury) (HCC)-resolved as of 01/16/2020 -Considered prerenal from recent alcohol use and probable poor p.o. intake -Resolved with fluids -BMP in a.m.  Alcohol withdrawal (HCC) -Monitor on CIWA protocol; still scoring but starting to downtrend -TOC consult for substance abuse counseling -Patient follows with AA outpatient he says  which he plans to continue doing so -Librium course prescribed at discharge to complete  HTN (hypertension) -Labetalol and hydralazine as needed  Elevated LFTs -Check hepatitis panel = negative -AST/ALT pattern not typical for alcohol use at this time -Patient denies any illicit drug use or IV drugs  Leukocytosis-resolved as of 01/16/2020 -Low suspicion for signs of infection at this time, likely reactive -Follow CBC  Hyponatremia-resolved as of 01/16/2020 -Considered due to alcohol use -Resolved with fluids    The patient's chronic medical conditions were treated accordingly per the patient's home medication regimen except as noted.  On day of discharge, patient was felt deemed stable for discharge. Patient/family member advised to call PCP or come back to ER if needed.   Principal Diagnosis: AKI (acute kidney injury) [facility](HCC)  Discharge Diagnoses: Active Hospital Problems   Diagnosis Date Noted  . Alcohol withdrawal (HCC) 11/10/2016    Priority: High  . HTN (hypertension) 05/23/2017    Priority: Medium  . Elevated LFTs 05/27/2017    Priority: Low    Resolved Hospital Problems   Diagnosis Date Noted Date Resolved  . AKI (acute kidney injury) (HCC) 01/14/2020 01/16/2020    Priority: High  . Leukocytosis 01/14/2020 01/16/2020  . Hyponatremia 11/10/2016 01/16/2020    Discharge Instructions    Diet general   Complete by: As directed    Increase activity slowly   Complete by: As directed      Allergies as of 01/17/2020   No Known Allergies  Medication List    STOP taking these medications   ibuprofen 200 MG tablet Commonly known as: ADVIL     TAKE these medications   chlordiazePOXIDE 10 MG capsule Commonly known as: LIBRIUM Take 1 capsule (10 mg total) by mouth 3 (three) times daily for 4 days.   escitalopram 10 MG tablet Commonly known as: LEXAPRO Take 1 tablet (10 mg total) by mouth daily.   multivitamin with minerals Tabs tablet Take 1 tablet by mouth  daily.       No Known Allergies  Consultations: n/a  Discharge Exam: BP (!) 119/92 (BP Location: Right Arm)   Pulse 87   Temp 98.6 F (37 C) (Oral)   Resp 16   Ht 5\' 10"  (1.778 m)   Wt 80.7 kg   SpO2 100%   BMI 25.54 kg/m  General appearance: Adult man sitting up in bed appearing much more comfortable; AOx4 Head: Normocephalic, without obvious abnormality, atraumatic Eyes: EOMI Lungs: clear to auscultation bilaterally Heart: regular rate and rhythm and S1, S2 normal Abdomen: normal findings: bowel sounds normal and soft, non-tender Extremities: No edema Skin: mobility and turgor normal Neurologic: Grossly normal. No further tremor   The results of significant diagnostics from this hospitalization (including imaging, microbiology, ancillary and laboratory) are listed below for reference.   Microbiology: Recent Results (from the past 240 hour(s))  Resp Panel by RT-PCR (Flu A&B, Covid) Nasopharyngeal Swab     Status: None   Collection Time: 01/14/20  8:43 PM   Specimen: Nasopharyngeal Swab; Nasopharyngeal(NP) swabs in vial transport medium  Result Value Ref Range Status   SARS Coronavirus 2 by RT PCR NEGATIVE NEGATIVE Final    Comment: (NOTE) SARS-CoV-2 target nucleic acids are NOT DETECTED.  The SARS-CoV-2 RNA is generally detectable in upper respiratory specimens during the acute phase of infection. The lowest concentration of SARS-CoV-2 viral copies this assay can detect is 138 copies/mL. A negative result does not preclude SARS-Cov-2 infection and should not be used as the sole basis for treatment or other patient management decisions. A negative result may occur with  improper specimen collection/handling, submission of specimen other than nasopharyngeal swab, presence of viral mutation(s) within the areas targeted by this assay, and inadequate number of viral copies(<138 copies/mL). A negative result must be combined with clinical observations, patient history,  and epidemiological information. The expected result is Negative.  Fact Sheet for Patients:  03/13/20  Fact Sheet for Healthcare Providers:  BloggerCourse.com  This test is no t yet approved or cleared by the [place] FDA and  has been authorized for detection and/or diagnosis of SARS-CoV-2 by FDA under an Emergency Use Authorization (EUA). This EUA will remain  in effect (meaning this test can be used) for the duration of the COVID-19 declaration under Section 564(b)(1) of the Act, 21 U.S.C.section 360bbb-3(b)(1), unless the authorization is terminated  or revoked sooner.       Influenza A by PCR NEGATIVE NEGATIVE Final   Influenza B by PCR NEGATIVE NEGATIVE Final    Comment: (NOTE) The Xpert Xpress SARS-CoV-2/FLU/RSV plus assay is intended as an aid in the diagnosis of influenza from Nasopharyngeal swab specimens and should not be used as a sole basis for treatment. Nasal washings and aspirates are unacceptable for Xpert Xpress SARS-CoV-2/FLU/RSV testing.  Fact Sheet for Patients: [place]  Fact Sheet for Healthcare Providers: BloggerCourse.com  This test is not yet approved or cleared by the [employer] FDA and has been authorized for detection and/or diagnosis of SARS-CoV-2  by FDA under an Emergency Use Authorization (EUA). This EUA will remain in effect (meaning this test can be used) for the duration of the COVID-19 declaration under Section 564(b)(1) of the Act, 21 U.S.C. section 360bbb-3(b)(1), unless the authorization is terminated or revoked.  Performed at [facility], [address] [place], [place] [address]      Labs: BNP (last 3 results) No results for input(s): BNP in the last 8760 hours. Basic Metabolic Panel: Recent Labs  Lab 01/14/20 2115 01/15/20 0644 01/16/20 0656 01/17/20 0638  NA 131* 133* 135 134*   K 4.3 3.5 3.3* 3.9  CL 86* 96* 100 99  CO2 24 26 26 26   GLUCOSE 145* 129* 97 88  BUN 48* 36* 17 16  CREATININE 2.77* 1.73* 0.95 0.89  CALCIUM 9.3 8.8* 8.4* 9.0  MG  --  2.5* 2.1 2.0  PHOS  --  3.6 3.4 4.0   Liver Function Tests: Recent Labs  Lab 01/14/20 2115 01/15/20 0644  AST 82* 79*  ALT 168* 129*  ALKPHOS 61 52  BILITOT 3.4* 1.7*  PROT 8.3* 6.8  ALBUMIN 5.0 4.0   No results for input(s): LIPASE, AMYLASE in the last 168 hours. No results for input(s): AMMONIA in the last 168 hours. CBC: Recent Labs  Lab 01/14/20 2115 01/15/20 0644 01/16/20 0656 01/17/20 0638  WBC 15.1* 11.4* 7.0 6.5  NEUTROABS 12.4*  --  4.8 4.0  HGB 15.9 13.9 12.6* 13.4  HCT 44.7 40.1 37.2* 39.8  MCV 86.3 89.9 93.9 92.6  PLT 298 222 147* 151   Cardiac Enzymes: No results for input(s): CKTOTAL, CKMB, CKMBINDEX, TROPONINI in the last 168 hours. BNP: Invalid input(s): POCBNP CBG: No results for input(s): GLUCAP in the last 168 hours. D-Dimer No results for input(s): DDIMER in the last 72 hours. Hgb A1c No results for input(s): HGBA1C in the last 72 hours. Lipid Profile No results for input(s): CHOL, HDL, LDLCALC, TRIG, CHOLHDL, LDLDIRECT in the last 72 hours. Thyroid function studies No results for input(s): TSH, T4TOTAL, T3FREE, THYROIDAB in the last 72 hours.  Invalid input(s): FREET3 Anemia work up No results for input(s): VITAMINB12, FOLATE, FERRITIN, TIBC, IRON, RETICCTPCT in the last 72 hours. Urinalysis    Component Value Date/Time   COLORURINE YELLOW 05/23/2017 1028   APPEARANCEUR CLEAR 05/23/2017 1028   LABSPEC 1.021 05/23/2017 1028   PHURINE 5.0 05/23/2017 1028   GLUCOSEU NEGATIVE 05/23/2017 1028   HGBUR SMALL (A) 05/23/2017 1028   BILIRUBINUR NEGATIVE 05/23/2017 1028   KETONESUR 20 (A) 05/23/2017 1028   PROTEINUR 100 (A) 05/23/2017 1028   UROBILINOGEN 0.2 12/06/2012 1725   NITRITE NEGATIVE 05/23/2017 1028   LEUKOCYTESUR NEGATIVE 05/23/2017 1028   Sepsis  Labs Invalid input(s): PROCALCITONIN,  WBC,  LACTICIDVEN Microbiology Recent Results (from the past 240 hour(s))  Resp Panel by RT-PCR (Flu A&B, Covid) Nasopharyngeal Swab     Status: None   Collection Time: 01/14/20  8:43 PM   Specimen: Nasopharyngeal Swab; Nasopharyngeal(NP) swabs in vial transport medium  Result Value Ref Range Status   SARS Coronavirus 2 by RT PCR NEGATIVE NEGATIVE Final    Comment: (NOTE) SARS-CoV-2 target nucleic acids are NOT DETECTED.  The SARS-CoV-2 RNA is generally detectable in upper respiratory specimens during the acute phase of infection. The lowest concentration of SARS-CoV-2 viral copies this assay can detect is 138 copies/mL. A negative result does not preclude SARS-Cov-2 infection and should not be used as the sole basis for treatment or other patient management decisions.  A negative result may occur with  improper specimen collection/handling, submission of specimen other than nasopharyngeal swab, presence of viral mutation(s) within the areas targeted by this assay, and inadequate number of viral copies(<138 copies/mL). A negative result must be combined with clinical observations, patient history, and epidemiological information. The expected result is Negative.  Fact Sheet for Patients:  BloggerCourse.com  Fact Sheet for Healthcare Providers:  [employer]  This test is no t yet approved or cleared by the [place] FDA and  has been authorized for detection and/or diagnosis of SARS-CoV-2 by FDA under an Emergency Use Authorization (EUA). This EUA will remain  in effect (meaning this test can be used) for the duration of the COVID-19 declaration under Section 564(b)(1) of the Act, 21 U.S.C.section 360bbb-3(b)(1), unless the authorization is terminated  or revoked sooner.       Influenza A by PCR NEGATIVE NEGATIVE Final   Influenza B by PCR NEGATIVE NEGATIVE Final    Comment:  (NOTE) The Xpert Xpress SARS-CoV-2/FLU/RSV plus assay is intended as an aid in the diagnosis of influenza from Nasopharyngeal swab specimens and should not be used as a sole basis for treatment. Nasal washings and aspirates are unacceptable for Xpert Xpress SARS-CoV-2/FLU/RSV testing.  Fact Sheet for Patients: BloggerCourse.com  Fact Sheet for Healthcare Providers: [employer]  This test is not yet approved or cleared by the [place] FDA and has been authorized for detection and/or diagnosis of SARS-CoV-2 by FDA under an Emergency Use Authorization (EUA). This EUA will remain in effect (meaning this test can be used) for the duration of the COVID-19 declaration under Section 564(b)(1) of the Act, 21 U.S.C. section 360bbb-3(b)(1), unless the authorization is terminated or revoked.  Performed at [facility], [address] [place], [place] [address]     Procedures/Studies: No results found.   Time coordinating discharge: Over 30 minutes    Lewie Chamber, MD  [facility] 01/17/2020, 3:46 PM

## 2020-01-17 NOTE — Progress Notes (Signed)
AVS reviewed and questions answered.  Patient alert and oriented, skin warm and dry.  Transported to main lobby and care transferred to family/friend without incident.  

## 2020-06-26 DIAGNOSIS — F419 Anxiety disorder, unspecified: Secondary | ICD-10-CM | POA: Insufficient documentation

## 2020-07-14 ENCOUNTER — Encounter (HOSPITAL_COMMUNITY): Payer: Self-pay

## 2020-07-14 ENCOUNTER — Emergency Department (HOSPITAL_COMMUNITY): Payer: 59

## 2020-07-14 ENCOUNTER — Emergency Department (HOSPITAL_COMMUNITY)
Admission: EM | Admit: 2020-07-14 | Discharge: 2020-07-14 | Disposition: A | Discharge status: Home / Self Care | Payer: 59 | Attending: Emergency Medicine | Admitting: Emergency Medicine

## 2020-07-14 ENCOUNTER — Other Ambulatory Visit: Payer: Self-pay

## 2020-07-14 DIAGNOSIS — Z20822 Contact with and (suspected) exposure to covid-19: Secondary | ICD-10-CM | POA: Diagnosis not present

## 2020-07-14 DIAGNOSIS — M25511 Pain in right shoulder: Secondary | ICD-10-CM | POA: Diagnosis not present

## 2020-07-14 DIAGNOSIS — G8929 Other chronic pain: Secondary | ICD-10-CM | POA: Diagnosis not present

## 2020-07-14 DIAGNOSIS — Y900 Blood alcohol level of less than 20 mg/100 ml: Secondary | ICD-10-CM | POA: Insufficient documentation

## 2020-07-14 DIAGNOSIS — E875 Hyperkalemia: Secondary | ICD-10-CM | POA: Diagnosis not present

## 2020-07-14 DIAGNOSIS — E860 Dehydration: Secondary | ICD-10-CM | POA: Insufficient documentation

## 2020-07-14 DIAGNOSIS — R197 Diarrhea, unspecified: Secondary | ICD-10-CM | POA: Diagnosis not present

## 2020-07-14 DIAGNOSIS — I10 Essential (primary) hypertension: Secondary | ICD-10-CM | POA: Diagnosis not present

## 2020-07-14 LAB — CBC WITH DIFFERENTIAL/PLATELET
Abs Immature Granulocytes: 0.03 10*3/uL (ref 0.00–0.07)
Basophils Absolute: 0 10*3/uL (ref 0.0–0.1)
Basophils Relative: 0 %
Eosinophils Absolute: 0 10*3/uL (ref 0.0–0.5)
Eosinophils Relative: 0 %
HCT: 47 % (ref 39.0–52.0)
Hemoglobin: 16.4 g/dL (ref 13.0–17.0)
Immature Granulocytes: 0 %
Lymphocytes Relative: 6 %
Lymphs Abs: 0.6 10*3/uL — ABNORMAL LOW (ref 0.7–4.0)
MCH: 30.9 pg (ref 26.0–34.0)
MCHC: 34.9 g/dL (ref 30.0–36.0)
MCV: 88.5 fL (ref 80.0–100.0)
Monocytes Absolute: 0.6 10*3/uL (ref 0.1–1.0)
Monocytes Relative: 6 %
Neutro Abs: 9.3 10*3/uL — ABNORMAL HIGH (ref 1.7–7.7)
Neutrophils Relative %: 88 %
Platelets: 262 10*3/uL (ref 150–400)
RBC: 5.31 MIL/uL (ref 4.22–5.81)
RDW: 12.9 % (ref 11.5–15.5)
WBC: 10.6 10*3/uL — ABNORMAL HIGH (ref 4.0–10.5)
nRBC: 0 % (ref 0.0–0.2)

## 2020-07-14 LAB — RAPID URINE DRUG SCREEN, HOSP PERFORMED
Amphetamines: NOT DETECTED
Barbiturates: NOT DETECTED
Benzodiazepines: NOT DETECTED
Cocaine: NOT DETECTED
Opiates: NOT DETECTED
Tetrahydrocannabinol: NOT DETECTED

## 2020-07-14 LAB — COMPREHENSIVE METABOLIC PANEL
ALT: 44 U/L (ref 0–44)
AST: 59 U/L — ABNORMAL HIGH (ref 15–41)
Albumin: 5.4 g/dL — ABNORMAL HIGH (ref 3.5–5.0)
Alkaline Phosphatase: 67 U/L (ref 38–126)
Anion gap: 20 — ABNORMAL HIGH (ref 5–15)
BUN: 28 mg/dL — ABNORMAL HIGH (ref 6–20)
CO2: 24 mmol/L (ref 22–32)
Calcium: 9.8 mg/dL (ref 8.9–10.3)
Chloride: 86 mmol/L — ABNORMAL LOW (ref 98–111)
Creatinine, Ser: 1.54 mg/dL — ABNORMAL HIGH (ref 0.61–1.24)
GFR, Estimated: 54 mL/min — ABNORMAL LOW (ref >60)
Glucose, Bld: 146 mg/dL — ABNORMAL HIGH (ref 70–99)
Potassium: 5.4 mmol/L — ABNORMAL HIGH (ref 3.5–5.1)
Sodium: 130 mmol/L — ABNORMAL LOW (ref 135–145)
Total Bilirubin: 2.4 mg/dL — ABNORMAL HIGH (ref 0.3–1.2)
Total Protein: 8.8 g/dL — ABNORMAL HIGH (ref 6.5–8.1)

## 2020-07-14 LAB — RESP PANEL BY RT-PCR (FLU A&B, COVID) ARPGX2
Influenza A by PCR: NEGATIVE
Influenza B by PCR: NEGATIVE
SARS Coronavirus 2 by RT PCR: NEGATIVE

## 2020-07-14 LAB — LIPASE, BLOOD: Lipase: 59 U/L — ABNORMAL HIGH (ref 11–51)

## 2020-07-14 LAB — ETHANOL: Alcohol, Ethyl (B): <10 mg/dL (ref <10)

## 2020-07-14 MED ORDER — SODIUM CHLORIDE 0.9 % IV BOLUS
1000.0000 mL | Freq: Once | INTRAVENOUS | Status: AC
  Administered 2020-07-14: 12:00:00 1000 mL via INTRAVENOUS

## 2020-07-14 MED ORDER — LORAZEPAM 1 MG PO TABS: 0.0000 mg | ORAL_TABLET | Freq: Four times a day (QID) | ORAL | Status: DC

## 2020-07-14 MED ORDER — LORAZEPAM 2 MG/ML IJ SOLN: 0.0000 mg | Freq: Two times a day (BID) | INTRAMUSCULAR | Status: DC

## 2020-07-14 MED ORDER — SODIUM ZIRCONIUM CYCLOSILICATE 5 G PO PACK
5.0000 g | PACK | Freq: Once | ORAL | Status: AC
  Administered 2020-07-14: 12:00:00 5 g via ORAL
  Filled 2020-07-14: qty 1

## 2020-07-14 MED ORDER — LORAZEPAM 2 MG/ML IJ SOLN
0.0000 mg | Freq: Four times a day (QID) | INTRAMUSCULAR | Status: DC
  Administered 2020-07-14: 10:00:00 2 mg via INTRAVENOUS
  Filled 2020-07-14: qty 1

## 2020-07-14 MED ORDER — LORAZEPAM 1 MG PO TABS: 0.0000 mg | ORAL_TABLET | Freq: Two times a day (BID) | ORAL | Status: DC

## 2020-07-14 MED ORDER — ACETAMINOPHEN 325 MG PO TABS: 650.0000 mg | ORAL_TABLET | ORAL | Status: DC | PRN

## 2020-07-14 MED ORDER — THIAMINE HCL 100 MG/ML IJ SOLN
200.0000 mg | Freq: Three times a day (TID) | INTRAMUSCULAR | Status: DC
  Administered 2020-07-14: 10:00:00 200 mg via INTRAVENOUS
  Filled 2020-07-14: qty 2

## 2020-07-14 NOTE — Discharge Instructions (Addendum)
There were no serious problems found today.  You do not have a COVID infection.  For diarrhea, use Kaopectate or Imodium.  Make sure you are getting plenty of rest, drink a lot of fluids, especially water.  Try to avoid alcohol for now.  Follow-up with the orthopedist regarding your shoulder pain and recurrent dislocations.  The testing today indicated that you have some dehydration which should improve with oral fluids.  Make sure you are getting plenty of liquids daily.  Your potassium was elevated and will likely improve after the medicine, which we treated you with, and your dehydration improves.  Follow-up with a primary care doctor for general checkup in a few days, if you continue to have problems with diarrhea or having trouble eating.

## 2020-07-14 NOTE — ED Provider Notes (Signed)
[facility]-EMERGENCY DEPT Provider Note   CSN: [id number] Arrival date & time: 07/14/20  0857     History Chief Complaint  Patient presents with   Withdrawal    Alex Logan is a 55 y.o. male.  HPI He presents for 2 problems, diarrhea, and right shoulder pain.  He reports diarrhea for several days after eating food from a restaurant.  He thinks he has food poisoning.  He also wonders if he has COVID.  Additionally he complains of a right shoulder dislocation which occurred several days ago that he was able to relocate on his own.  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  He does not currently have an orthopedist, for shoulder care.  He denies fever, shortness of breath, cough, dizziness or weakness.  He is not having nausea or vomiting.  States he drank heavily over the weekend, but does not have a alcohol abuse problem.  There are no other known active modifying factors.    Past Medical History:  Diagnosis Date   Alcohol abuse    Anxiety    Depression    Hypertension     Patient Active Problem List   Diagnosis Date Noted   Tachycardia 01/14/2020   Elevated LFTs 05/27/2017   Muscular abdominal pain in right flank 05/27/2017   Fatty liver, alcoholic 05/27/2017   Depression with anxiety 05/27/2017   Pancytopenia (HCC) 05/27/2017   SVT (supraventricular tachycardia) (HCC)    HTN (hypertension) 05/23/2017   Acute vomiting    Pressure injury of skin 11/12/2016   Alcohol withdrawal (HCC) 11/10/2016   SIRS (systemic inflammatory response syndrome) (HCC) 11/10/2016   High anion gap metabolic acidosis 11/10/2016   Lactic acidosis 11/10/2016   Starvation ketoacidosis 11/10/2016   Alcohol use disorder, severe, dependence (HCC) 09/27/2016   Alcohol abuse with alcohol-induced mood disorder (HCC) 09/22/2016    Past Surgical History:  Procedure Laterality Date   NO PAST SURGERIES         Family History  Problem Relation Age of Onset    Emphysema Mother    Bipolar disorder Father    Alcohol abuse Father    Mental illness Neg Hx     Social History   Tobacco Use   Smoking status: Never   Smokeless tobacco: Never  Substance Use Topics   Alcohol use: Yes    Comment: Chronic ETOH use and abuse   Drug use: No    Home Medications Prior to Admission medications   Medication Sig Start Date End Date Taking? Authorizing Provider  escitalopram (LEXAPRO) 10 MG tablet Take 1 tablet (10 mg total) by mouth daily. 01/17/20   Lewie Chamber, MD  Multiple Vitamin (MULTIVITAMIN WITH MINERALS) TABS tablet Take 1 tablet by mouth daily. 01/17/20   Lewie Chamber, MD    Allergies    Patient has no known allergies.  Review of Systems   Review of Systems  All other systems reviewed and are negative.  Physical Exam Updated Vital Signs BP (!) 137/108   Pulse (!) 147   Temp 98.3 F (36.8 C) (Oral)   Resp 18   SpO2 96%  Physical exam was completed by me at 10:35 AM, following treatment with Ativan for elevated CIWA. Physical Exam Vitals and nursing note reviewed.  Constitutional:      General: He is not in acute distress.    Appearance: He is well-developed. He is not ill-appearing or diaphoretic.  HENT:     Head: Normocephalic and atraumatic.  Right Ear: External ear normal.     Left Ear: External ear normal.  Eyes:     Conjunctiva/sclera: Conjunctivae normal.     Pupils: Pupils are equal, round, and reactive to light.  Neck:     Trachea: Phonation normal.  Cardiovascular:     Rate and Rhythm: Normal rate and regular rhythm.     Heart sounds: Normal heart sounds.  Pulmonary:     Effort: Pulmonary effort is normal.     Breath sounds: Normal breath sounds.  Abdominal:     Palpations: Abdomen is soft.     Tenderness: There is no abdominal tenderness.  Musculoskeletal:        General: Normal range of motion.     Cervical back: Normal range of motion and neck supple.  Skin:    General: Skin is warm and dry.   Neurological:     Mental Status: He is alert and oriented to person, place, and time.     Cranial Nerves: No cranial nerve deficit.     Sensory: No sensory deficit.     Motor: No abnormal muscle tone.     Coordination: Coordination normal.     Comments: No dysarthria or aphasia.  No tremor.  Psychiatric:        Mood and Affect: Mood normal.        Behavior: Behavior normal.        Thought Content: Thought content normal.        Judgment: Judgment normal.    ED Results / Procedures / Treatments   Labs (all labs ordered are listed, but only abnormal results are displayed) Labs Reviewed  COMPREHENSIVE METABOLIC PANEL - Abnormal; Notable for the following components:      Result Value   Sodium 130 (*)    Potassium 5.4 (*)    Chloride 86 (*)    Glucose, Bld 146 (*)    BUN 28 (*)    Creatinine, Ser 1.54 (*)    Total Protein 8.8 (*)    Albumin 5.4 (*)    AST 59 (*)    Total Bilirubin 2.4 (*)    GFR, Estimated 54 (*)    Anion gap 20 (*)    All other components within normal limits  CBC WITH DIFFERENTIAL/PLATELET - Abnormal; Notable for the following components:   WBC 10.6 (*)    Neutro Abs 9.3 (*)    Lymphs Abs 0.6 (*)    All other components within normal limits  LIPASE, BLOOD - Abnormal; Notable for the following components:   Lipase 59 (*)    All other components within normal limits  RESP PANEL BY RT-PCR (FLU A&B, COVID) ARPGX2  ETHANOL  RAPID URINE DRUG SCREEN, HOSP PERFORMED    EKG None  Radiology DG Shoulder Right  Result Date: 07/14/2020 CLINICAL DATA:  Right shoulder pain with limited range of motion. History of shoulder dislocation EXAM: RIGHT SHOULDER - 2+ VIEW COMPARISON:  09/21/2018 FINDINGS: No acute fracture or dislocation. Glenohumeral and acromioclavicular joint spaces are maintained. Soft tissues unremarkable. IMPRESSION: Negative. Electronically Signed   By: Duanne Guess D.O.   On: 07/14/2020 12:21    Procedures .Critical Care  Date/Time:  07/14/2020 12:56 PM Performed by: Mancel Bale, MD Authorized by: Mancel Bale, MD   Critical care provider statement:    Critical care time (minutes):  40   Critical care start time:  07/14/2020 9:45 AM   Critical care end time:  07/14/2020 12:56 PM   Critical care time was  exclusive of:  Separately billable procedures and treating other patients   Critical care was necessary to treat or prevent imminent or life-threatening deterioration of the following conditions:  Metabolic crisis   Critical care was time spent personally by me on the following activities:  Blood draw for specimens, development of treatment plan with patient or surrogate, discussions with consultants, evaluation of patient's response to treatment, examination of patient, obtaining history from patient or surrogate, ordering and performing treatments and interventions, ordering and review of laboratory studies, pulse oximetry, re-evaluation of patient's condition, review of old charts and ordering and review of radiographic studies   Medications Ordered in ED Medications  LORazepam (ATIVAN) injection 0-4 mg (2 mg Intravenous Given 07/14/20 1008)    Or  LORazepam (ATIVAN) tablet 0-4 mg ( Oral See Alternative 07/14/20 1008)  LORazepam (ATIVAN) injection 0-4 mg (has no administration in time range)    Or  LORazepam (ATIVAN) tablet 0-4 mg (has no administration in time range)  thiamine (B-1) injection 200 mg (200 mg Intravenous Given 07/14/20 1008)  acetaminophen (TYLENOL) tablet 650 mg (has no administration in time range)  sodium chloride 0.9 % bolus 1,000 mL (0 mLs Intravenous Stopped 07/14/20 1254)  sodium zirconium cyclosilicate (LOKELMA) packet 5 g (5 g Oral Given 07/14/20 1222)    ED Course  I have reviewed the triage vital signs and the nursing notes.  Pertinent labs & imaging results that were available during my care of the patient were reviewed by me and considered in my medical decision making (see chart for details).     MDM Rules/Calculators/A&P                           Patient Vitals for the past 24 hrs:  BP Temp Temp src Pulse Resp SpO2  07/14/20 1219 (!) 137/108 -- -- (!) 147 18 96 %  07/14/20 1100 (!) 162/111 -- -- (!) 132 18 97 %  07/14/20 1034 (!) 141/111 -- -- (!) 142 18 97 %  07/14/20 1005 (!) 162/118 -- -- (!) 101 18 98 %  07/14/20 1000 -- -- -- (!) 132 -- 100 %  07/14/20 0915 (!) 181/124 -- -- (!) 139 20 100 %  07/14/20 0914 (!) 164/124 98.3 F (36.8 C) Oral -- 20 100 %    12:56 PM Reevaluation with update and discussion. After initial assessment and treatment, an updated evaluation reveals he is comfortable now, not vomiting and has no further complaints.  Findings discussed with the patient all questions were answered. Mancel Bale   Medical Decision Making:  This patient is presenting for evaluation of diarrhea and right shoulder pain, which does require a range of treatment options, and is a complaint that involves a moderate risk of morbidity and mortality. The differential diagnoses include alcohol withdrawal syndrome, acute viral or bacterial illness, chronic right shoulder disorder. I decided to review old records, and in summary patient with prior treatment in the ED for alcohol withdrawal syndrome, presenting with diarrhea and right shoulder pain.  He has had prior shoulder dislocations.  He has a history of alcohol use disorder, alcohol abuse, and alcohol withdrawal.  I did not require additional historical information from anyone.  Clinical Laboratory Tests Ordered, included CBC, Metabolic panel, and urine drug screen, lipase, alcohol level, viral panel . Review indicates normal except sodium low, potassium high, chloride high, glucose high, BUN high, creatinine high, total protein high, albumin high, AST high, lipase high, white count  high. Radiologic Tests Ordered, included right shoulder.  I independently Visualized: Radiograph images, which show no acute or chronic  injury    Critical Interventions-clinical evaluation, IV fluids, medication treatment for elevated CIWA score, laboratory assessment, radiographs, observation and reassessment  After These Interventions, the Patient was reevaluated and was found stable for discharge.  Patient's initial complaints were for concerns of alcohol withdrawal, but is also here for right shoulder pain.  He has diarrhea which is nonspecific.  He has mild dehydration.  He was treated with a dose of  CRITICAL CARE- yes Performed by: Mancel BaleElliott Lizanne Erker  Nursing Notes Reviewed/ Care Coordinated Applicable Imaging Reviewed Interpretation of Laboratory Data incorporated into ED treatment  The patient appears reasonably screened and/or stabilized for discharge and I doubt any other medical condition or other [facility] requiring further screening, evaluation, or treatment in the ED at this time prior to discharge.  Plan: Home Medications-Tylenol and Kaopectate as needed; Home Treatments-advance diet and increase fluids as tolerated; return here if the recommended treatment, does not improve the symptoms; Recommended follow up-PCP follow-up if not better in a few days.  Orthopedic problems regarding shoulder problem.     Final Clinical Impression(s) / ED Diagnoses Final diagnoses:  Diarrhea, unspecified type  Chronic right shoulder pain  Dehydration  Hyperkalemia    Rx / DC Orders ED Discharge Orders     None        Mancel BaleWentz, Babe Clenney, MD 07/14/20 1258

## 2020-07-14 NOTE — ED Triage Notes (Signed)
C/O N/V, headache, diarrhea since Friday.   Patient has tremors in triage.   Last had wine yesterday about 4pm.   Patient reports needing a covid test but then reports its alcohol withdrawal.   Takes BP medication but has not taken it in 4 days.   A/ox4 Ambulatory in triage.

## 2020-08-10 ENCOUNTER — Inpatient Hospital Stay (HOSPITAL_COMMUNITY)
Admission: EM | Admit: 2020-08-10 | Discharge: 2020-08-13 | DRG: 897 | Disposition: A | Discharge status: Home / Self Care | Payer: 59 | Attending: Internal Medicine | Admitting: Internal Medicine

## 2020-08-10 ENCOUNTER — Other Ambulatory Visit: Payer: Self-pay

## 2020-08-10 ENCOUNTER — Encounter (HOSPITAL_COMMUNITY): Payer: Self-pay

## 2020-08-10 DIAGNOSIS — D696 Thrombocytopenia, unspecified: Secondary | ICD-10-CM

## 2020-08-10 DIAGNOSIS — F418 Other specified anxiety disorders: Secondary | ICD-10-CM | POA: Diagnosis not present

## 2020-08-10 DIAGNOSIS — F10229 Alcohol dependence with intoxication, unspecified: Secondary | ICD-10-CM | POA: Diagnosis present

## 2020-08-10 DIAGNOSIS — I10 Essential (primary) hypertension: Secondary | ICD-10-CM | POA: Diagnosis present

## 2020-08-10 DIAGNOSIS — Z79899 Other long term (current) drug therapy: Secondary | ICD-10-CM | POA: Diagnosis not present

## 2020-08-10 DIAGNOSIS — K700 Alcoholic fatty liver: Secondary | ICD-10-CM | POA: Diagnosis present

## 2020-08-10 DIAGNOSIS — F10230 Alcohol dependence with withdrawal, uncomplicated: Secondary | ICD-10-CM | POA: Diagnosis not present

## 2020-08-10 DIAGNOSIS — F10239 Alcohol dependence with withdrawal, unspecified: Secondary | ICD-10-CM | POA: Diagnosis present

## 2020-08-10 DIAGNOSIS — F419 Anxiety disorder, unspecified: Secondary | ICD-10-CM | POA: Diagnosis present

## 2020-08-10 DIAGNOSIS — Y900 Blood alcohol level of less than 20 mg/100 ml: Secondary | ICD-10-CM | POA: Diagnosis present

## 2020-08-10 DIAGNOSIS — Z20822 Contact with and (suspected) exposure to covid-19: Secondary | ICD-10-CM | POA: Diagnosis present

## 2020-08-10 DIAGNOSIS — F32A Depression, unspecified: Secondary | ICD-10-CM | POA: Diagnosis present

## 2020-08-10 DIAGNOSIS — Z818 Family history of other mental and behavioral disorders: Secondary | ICD-10-CM | POA: Diagnosis not present

## 2020-08-10 DIAGNOSIS — F10939 Alcohol use, unspecified with withdrawal, unspecified: Secondary | ICD-10-CM | POA: Diagnosis present

## 2020-08-10 LAB — SARS CORONAVIRUS 2 (TAT 6-24 HRS): SARS Coronavirus 2: NEGATIVE

## 2020-08-10 LAB — CBC WITH DIFFERENTIAL/PLATELET
Abs Immature Granulocytes: 0.03 10*3/uL (ref 0.00–0.07)
Basophils Absolute: 0.1 10*3/uL (ref 0.0–0.1)
Basophils Relative: 1 %
Eosinophils Absolute: 0 10*3/uL (ref 0.0–0.5)
Eosinophils Relative: 0 %
HCT: 45 % (ref 39.0–52.0)
Hemoglobin: 15.3 g/dL (ref 13.0–17.0)
Immature Granulocytes: 0 %
Lymphocytes Relative: 9 %
Lymphs Abs: 0.9 10*3/uL (ref 0.7–4.0)
MCH: 31.2 pg (ref 26.0–34.0)
MCHC: 34 g/dL (ref 30.0–36.0)
MCV: 91.6 fL (ref 80.0–100.0)
Monocytes Absolute: 1 10*3/uL (ref 0.1–1.0)
Monocytes Relative: 9 %
Neutro Abs: 8.2 10*3/uL — ABNORMAL HIGH (ref 1.7–7.7)
Neutrophils Relative %: 81 %
Platelets: 305 10*3/uL (ref 150–400)
RBC: 4.91 MIL/uL (ref 4.22–5.81)
RDW: 13.3 % (ref 11.5–15.5)
WBC: 10.1 10*3/uL (ref 4.0–10.5)
nRBC: 0 % (ref 0.0–0.2)

## 2020-08-10 LAB — COMPREHENSIVE METABOLIC PANEL
ALT: 33 U/L (ref 0–44)
ALT: 32 U/L (ref 0–44)
AST: 40 U/L (ref 15–41)
AST: 34 U/L (ref 15–41)
Albumin: 4.7 g/dL (ref 3.5–5.0)
Albumin: 4.2 g/dL (ref 3.5–5.0)
Alkaline Phosphatase: 56 U/L (ref 38–126)
Alkaline Phosphatase: 44 U/L (ref 38–126)
Anion gap: 15 (ref 5–15)
Anion gap: 15 (ref 5–15)
BUN: 12 mg/dL (ref 6–20)
BUN: 10 mg/dL (ref 6–20)
CO2: 21 mmol/L — ABNORMAL LOW (ref 22–32)
CO2: 19 mmol/L — ABNORMAL LOW (ref 22–32)
Calcium: 9.4 mg/dL (ref 8.9–10.3)
Calcium: 8.8 mg/dL — ABNORMAL LOW (ref 8.9–10.3)
Chloride: 98 mmol/L (ref 98–111)
Chloride: 101 mmol/L (ref 98–111)
Creatinine, Ser: 0.91 mg/dL (ref 0.61–1.24)
Creatinine, Ser: 0.74 mg/dL (ref 0.61–1.24)
GFR, Estimated: >60 mL/min (ref >60)
GFR, Estimated: >60 mL/min (ref >60)
Glucose, Bld: 118 mg/dL — ABNORMAL HIGH (ref 70–99)
Glucose, Bld: 95 mg/dL (ref 70–99)
Potassium: 3.5 mmol/L (ref 3.5–5.1)
Potassium: 3.7 mmol/L (ref 3.5–5.1)
Sodium: 134 mmol/L — ABNORMAL LOW (ref 135–145)
Sodium: 135 mmol/L (ref 135–145)
Total Bilirubin: 0.7 mg/dL (ref 0.3–1.2)
Total Bilirubin: 0.9 mg/dL (ref 0.3–1.2)
Total Protein: 7.8 g/dL (ref 6.5–8.1)
Total Protein: 7 g/dL (ref 6.5–8.1)

## 2020-08-10 LAB — CBC
HCT: 41.8 % (ref 39.0–52.0)
Hemoglobin: 14 g/dL (ref 13.0–17.0)
MCH: 31.5 pg (ref 26.0–34.0)
MCHC: 33.5 g/dL (ref 30.0–36.0)
MCV: 93.9 fL (ref 80.0–100.0)
Platelets: 111 10*3/uL — ABNORMAL LOW (ref 150–400)
RBC: 4.45 MIL/uL (ref 4.22–5.81)
RDW: 13.2 % (ref 11.5–15.5)
WBC: 7 10*3/uL (ref 4.0–10.5)
nRBC: 0 % (ref 0.0–0.2)

## 2020-08-10 LAB — ETHANOL: Alcohol, Ethyl (B): 18 mg/dL — ABNORMAL HIGH (ref <10)

## 2020-08-10 LAB — MAGNESIUM: Magnesium: 1.7 mg/dL (ref 1.7–2.4)

## 2020-08-10 LAB — HEMOGLOBIN A1C
Hgb A1c MFr Bld: 5.5 % (ref 4.8–5.6)
Mean Plasma Glucose: 111.15 mg/dL

## 2020-08-10 LAB — PHOSPHORUS: Phosphorus: 2.7 mg/dL (ref 2.5–4.6)

## 2020-08-10 MED ORDER — ONDANSETRON HCL 4 MG/2ML IJ SOLN: 4.0000 mg | Freq: Four times a day (QID) | INTRAMUSCULAR | Status: DC | PRN

## 2020-08-10 MED ORDER — LORAZEPAM 1 MG PO TABS: 0.0000 mg | ORAL_TABLET | Freq: Four times a day (QID) | ORAL | Status: AC

## 2020-08-10 MED ORDER — THIAMINE HCL 100 MG/ML IJ SOLN: INTRAVENOUS | Status: DC

## 2020-08-10 MED ORDER — ESCITALOPRAM OXALATE 10 MG PO TABS
10.0000 mg | ORAL_TABLET | Freq: Every day | ORAL | Status: DC
  Administered 2020-08-11 – 2020-08-12 (×2): 10 mg via ORAL
  Filled 2020-08-10 (×3): qty 1

## 2020-08-10 MED ORDER — FOLIC ACID 1 MG PO TABS
1.0000 mg | ORAL_TABLET | Freq: Every day | ORAL | Status: DC
  Administered 2020-08-11 – 2020-08-13 (×3): 1 mg via ORAL
  Filled 2020-08-10 (×3): qty 1

## 2020-08-10 MED ORDER — LORAZEPAM 2 MG/ML IJ SOLN
2.0000 mg | Freq: Once | INTRAMUSCULAR | Status: AC
  Administered 2020-08-10: 2 mg via INTRAVENOUS
  Filled 2020-08-10: qty 1

## 2020-08-10 MED ORDER — SODIUM CHLORIDE 0.9 % IV BOLUS
1000.0000 mL | Freq: Once | INTRAVENOUS | Status: AC
  Administered 2020-08-10: 1000 mL via INTRAVENOUS

## 2020-08-10 MED ORDER — FOLIC ACID 1 MG PO TABS: 1.0000 mg | ORAL_TABLET | Freq: Every day | ORAL | Status: DC

## 2020-08-10 MED ORDER — LABETALOL HCL 100 MG PO TABS
100.0000 mg | ORAL_TABLET | Freq: Four times a day (QID) | ORAL | Status: DC | PRN
  Administered 2020-08-10 – 2020-08-11 (×2): 100 mg via ORAL
  Filled 2020-08-10 (×5): qty 1

## 2020-08-10 MED ORDER — ADULT MULTIVITAMIN W/MINERALS CH: 1.0000 | ORAL_TABLET | Freq: Every day | ORAL | Status: DC

## 2020-08-10 MED ORDER — LORAZEPAM 2 MG/ML IJ SOLN: 1.0000 mg | INTRAMUSCULAR | Status: AC | PRN

## 2020-08-10 MED ORDER — LORAZEPAM 1 MG PO TABS
0.0000 mg | ORAL_TABLET | Freq: Two times a day (BID) | ORAL | Status: DC
Start: 2020-08-12 — End: 2020-08-13

## 2020-08-10 MED ORDER — SODIUM CHLORIDE 0.9 % IV SOLN: INTRAVENOUS | Status: DC

## 2020-08-10 MED ORDER — ADULT MULTIVITAMIN W/MINERALS CH
1.0000 | ORAL_TABLET | Freq: Every day | ORAL | Status: DC
  Administered 2020-08-11 – 2020-08-13 (×3): 1 via ORAL
  Filled 2020-08-10 (×3): qty 1

## 2020-08-10 MED ORDER — LORAZEPAM 1 MG PO TABS
1.0000 mg | ORAL_TABLET | ORAL | Status: AC | PRN
Start: 2020-08-10 — End: 2020-08-13
  Administered 2020-08-11: 2 mg via ORAL
  Administered 2020-08-11 – 2020-08-12 (×2): 1 mg via ORAL
  Administered 2020-08-12: 2 mg via ORAL
  Filled 2020-08-10: qty 1
  Filled 2020-08-10 (×2): qty 2
  Filled 2020-08-10: qty 1

## 2020-08-10 MED ORDER — AMLODIPINE BESYLATE 10 MG PO TABS
10.0000 mg | ORAL_TABLET | Freq: Every day | ORAL | Status: DC
  Administered 2020-08-11 – 2020-08-13 (×3): 10 mg via ORAL
  Filled 2020-08-10 (×3): qty 1

## 2020-08-10 MED ORDER — PANTOPRAZOLE SODIUM 40 MG PO TBEC
40.0000 mg | DELAYED_RELEASE_TABLET | Freq: Every day | ORAL | Status: DC
  Administered 2020-08-10 – 2020-08-13 (×4): 40 mg via ORAL
  Filled 2020-08-10 (×4): qty 1

## 2020-08-10 MED ORDER — THIAMINE HCL 100 MG/ML IJ SOLN
Freq: Once | INTRAVENOUS | Status: AC
  Filled 2020-08-10: qty 1000

## 2020-08-10 MED ORDER — NAPROXEN SODIUM 220 MG PO TABS: 440.0000 mg | ORAL_TABLET | Freq: Every day | ORAL | Status: DC | PRN

## 2020-08-10 MED ORDER — THIAMINE HCL 100 MG/ML IJ SOLN: 100.0000 mg | Freq: Every day | INTRAMUSCULAR | Status: DC

## 2020-08-10 MED ORDER — THIAMINE HCL 100 MG/ML IJ SOLN
100.0000 mg | Freq: Every day | INTRAMUSCULAR | Status: DC
  Filled 2020-08-10 (×2): qty 2

## 2020-08-10 MED ORDER — ENOXAPARIN SODIUM 40 MG/0.4ML IJ SOSY
40.0000 mg | PREFILLED_SYRINGE | INTRAMUSCULAR | Status: DC
  Administered 2020-08-10 – 2020-08-13 (×4): 40 mg via SUBCUTANEOUS
  Filled 2020-08-10 (×4): qty 0.4

## 2020-08-10 MED ORDER — NAPROXEN 250 MG PO TABS
500.0000 mg | ORAL_TABLET | Freq: Every day | ORAL | Status: DC | PRN
  Filled 2020-08-10: qty 2

## 2020-08-10 MED ORDER — THIAMINE HCL 100 MG PO TABS: 100.0000 mg | ORAL_TABLET | Freq: Every day | ORAL | Status: DC

## 2020-08-10 MED ORDER — THIAMINE HCL 100 MG PO TABS
100.0000 mg | ORAL_TABLET | Freq: Every day | ORAL | Status: DC
  Administered 2020-08-11 – 2020-08-13 (×3): 100 mg via ORAL
  Filled 2020-08-10 (×3): qty 1

## 2020-08-10 NOTE — ED Triage Notes (Signed)
Pt reports alcohol withdrawals with tremors and nausea. Pt states that his last drink was yesterday morning.

## 2020-08-10 NOTE — ED Notes (Signed)
Pt ambulated to restroom independently, steady gait

## 2020-08-10 NOTE — ED Notes (Signed)
Brought pt snack and drink 

## 2020-08-10 NOTE — H&P (Signed)
History and Physical    Alex Logan EXH:[id number] DOB: Dec 26, 1965 DOA: 08/10/2020  [facility]: [facility] (Confirm with patient/family/NH records and if not entered, this has to be entered at [facility] point of entry) Patient coming from: Home  I have personally briefly reviewed patient's old medical records in [facility]  Chief Complaint: I feel better  HPI: Alex Logan is a 55 y.o. male with medical history significant of HTN, alcohol abuse, GERD, anxiety/depression, fatty liver, presented with palpitations, tremors, nauseous and vomiting.  Patient is a heavy drinker drinks usually 500 ml whiskey every day.  This week, he decided to detox himself.  He stopped alcohol drink altogether yesterday, and went into alcohol withdrawal in the evening.  He started to feel nausea, frequent vomiting of stomach content 5-6 times last night, then started to feel palpitations and the tremors.  No abdominal pain, no diarrhea.  ED Course: Patient was found to be in tachycardia, blood pressure significantly elevated.  Patient was given 1 L of IV boluses, and started to feel somewhat improved of palpitations.  Still feeling nausea.  Review of Systems: As per HPI otherwise 14 point review of systems negative.    Past Medical History:  Diagnosis Date   Alcohol abuse    Anxiety    Depression    Hypertension     Past Surgical History:  Procedure Laterality Date   NO PAST SURGERIES       reports that he has never smoked. He has never used smokeless tobacco. He reports current alcohol use. He reports that he does not use drugs.  No Known Allergies  Family History  Problem Relation Age of Onset   Emphysema Mother    Bipolar disorder Father    Alcohol abuse Father    Mental illness Neg Hx      Prior to Admission medications   Medication Sig Start Date End Date Taking? Authorizing Provider  escitalopram (LEXAPRO) 10 MG tablet Take 1 tablet (10 mg total) by mouth daily. 01/17/20   Yes Lewie Chamber, MD  Multiple Vitamin (MULTIVITAMIN WITH MINERALS) TABS tablet Take 1 tablet by mouth daily. 01/17/20   Lewie Chamber, MD    Physical Exam: Vitals:   08/10/20 0630 08/10/20 0645 08/10/20 0700 08/10/20 0815  BP: (!) 166/105 (!) 167/110 (!) 165/100 (!) 159/104  Pulse: (!) 113 (!) 106 99 (!) 116  Resp: (!) 21 18 20 18   Temp:      SpO2: 96% 99% 98% 99%    Constitutional: NAD, calm, comfortable Vitals:   08/10/20 0630 08/10/20 0645 08/10/20 0700 08/10/20 0815  BP: (!) 166/105 (!) 167/110 (!) 165/100 (!) 159/104  Pulse: (!) 113 (!) 106 99 (!) 116  Resp: (!) 21 18 20 18   Temp:      SpO2: 96% 99% 98% 99%   Eyes: PERRL, lids and conjunctivae normal ENMT: Mucous membranes are dry. Posterior pharynx clear of any exudate or lesions.Normal dentition.  Neck: normal, supple, no masses, no thyromegaly Respiratory: clear to auscultation bilaterally, no wheezing, no crackles. Normal respiratory effort. No accessory muscle use.  Cardiovascular: Regular rate and rhythm, no murmurs / rubs / gallops. No extremity edema. 2+ pedal pulses. No carotid bruits.  Abdomen: no tenderness, no masses palpated. No hepatosplenomegaly. Bowel sounds positive.  Musculoskeletal: no clubbing / cyanosis. No joint deformity upper and lower extremities. Good ROM, no contractures. Normal muscle tone.  Skin: no rashes, lesions, ulcers. No induration Neurologic: CN 2-12 grossly intact. Sensation intact, DTR normal.  Strength 5/5 in all 4.  Fine tremors on bilateral fingertips and tongue. Psychiatric: Normal judgment and insight. Alert and oriented x 3. Normal mood.     Labs on Admission: I have personally reviewed following labs and imaging studies  CBC: Recent Labs  Lab 08/10/20 0612  WBC 10.1  NEUTROABS 8.2*  HGB 15.3  HCT 45.0  MCV 91.6  PLT 305   Basic Metabolic Panel: Recent Labs  Lab 08/10/20 0612  NA 134*  K 3.5  CL 98  CO2 21*  GLUCOSE 118*  BUN 12  CREATININE 0.91  CALCIUM 9.4    GFR: CrCl cannot be calculated (Unknown ideal weight.). Liver Function Tests: Recent Labs  Lab 08/10/20 0612  AST 40  ALT 33  ALKPHOS 56  BILITOT 0.7  PROT 7.8  ALBUMIN 4.7   No results for input(s): LIPASE, AMYLASE in the last 168 hours. No results for input(s): AMMONIA in the last 168 hours. Coagulation Profile: No results for input(s): INR, PROTIME in the last 168 hours. Cardiac Enzymes: No results for input(s): CKTOTAL, CKMB, CKMBINDEX, TROPONINI in the last 168 hours. BNP (last 3 results) No results for input(s): PROBNP in the last 8760 hours. HbA1C: No results for input(s): HGBA1C in the last 72 hours. CBG: No results for input(s): GLUCAP in the last 168 hours. Lipid Profile: No results for input(s): CHOL, HDL, LDLCALC, TRIG, CHOLHDL, LDLDIRECT in the last 72 hours. Thyroid Function Tests: No results for input(s): TSH, T4TOTAL, FREET4, T3FREE, THYROIDAB in the last 72 hours. Anemia Panel: No results for input(s): VITAMINB12, FOLATE, FERRITIN, TIBC, IRON, RETICCTPCT in the last 72 hours. Urine analysis:    Component Value Date/Time   COLORURINE YELLOW 05/23/2017 1028   APPEARANCEUR CLEAR 05/23/2017 1028   LABSPEC 1.021 05/23/2017 1028   PHURINE 5.0 05/23/2017 1028   GLUCOSEU NEGATIVE 05/23/2017 1028   HGBUR SMALL (A) 05/23/2017 1028   BILIRUBINUR NEGATIVE 05/23/2017 1028   KETONESUR 20 (A) 05/23/2017 1028   PROTEINUR 100 (A) 05/23/2017 1028   UROBILINOGEN 0.2 12/06/2012 1725   NITRITE NEGATIVE 05/23/2017 1028   LEUKOCYTESUR NEGATIVE 05/23/2017 1028    Radiological Exams on Admission: No results found.  EKG: Independently reviewed.  Sinus tachycardia, no ST-T changes  Assessment/Plan Active Problems:   Alcohol withdrawal (HCC)  (please populate well all problems here in Problem List. (For example, if patient is on BP meds at home and you resume or decide to hold them, it is a problem that needs to be her. Same for CAD, COPD, HLD and so on)  Alcohol  withdrawal -Start CIWA protocol with scheduled benzo, titrate according to CIWA score. -Banana bag at 150 mL/h x 1 day. -Patient interested in outpatient alcohol rehab program, consult case manager. -Zofran for N/V  HTN uncontrolled -Restart amlodipine, as needed labetalol.  Anxiety depression -Restart Lexapro.  Fatty liver -Outpatient GI follow up.  DVT prophylaxis: Lovenox Code Status: Full code Family Communication: None at bedside Disposition Plan: Expect 2 to 3 days hospital stay to benzos with CIWA protocol. Consults called: None Admission status: Tele admit   Emeline General MD [facility] Pager [phone]  08/10/2020, 9:06 AM

## 2020-08-10 NOTE — ED Provider Notes (Signed)
[facility]EMERGENCY DEPT Provider Note   CSN: [id number] Arrival date & time: 08/10/20  0454     History Chief Complaint  Patient presents with   Alcohol Intoxication    Alex Logan is a 55 y.o. male.  Patient is a 55 year old male with past medical history of alcohol abuse, hypertension.  Patient presenting with complaints of alcohol withdrawal.  His last drink was yesterday afternoon.  He describes feeling his heart racing, feeling shaky and jittery, and reports 1 episode of vomiting.  He denies any chest pain or shortness of breath.  He has been in DTs in the past.  The history is provided by the patient.      Past Medical History:  Diagnosis Date   Alcohol abuse    Anxiety    Depression    Hypertension     Patient Active Problem List   Diagnosis Date Noted   Tachycardia 01/14/2020   Elevated LFTs 05/27/2017   Muscular abdominal pain in right flank 05/27/2017   Fatty liver, alcoholic 05/27/2017   Depression with anxiety 05/27/2017   Pancytopenia (HCC) 05/27/2017   SVT (supraventricular tachycardia) (HCC)    HTN (hypertension) 05/23/2017   Acute vomiting    Pressure injury of skin 11/12/2016   Alcohol withdrawal (HCC) 11/10/2016   SIRS (systemic inflammatory response syndrome) (HCC) 11/10/2016   High anion gap metabolic acidosis 11/10/2016   Lactic acidosis 11/10/2016   Starvation ketoacidosis 11/10/2016   Alcohol use disorder, severe, dependence (HCC) 09/27/2016   Alcohol abuse with alcohol-induced mood disorder (HCC) 09/22/2016    Past Surgical History:  Procedure Laterality Date   NO PAST SURGERIES         Family History  Problem Relation Age of Onset   Emphysema Mother    Bipolar disorder Father    Alcohol abuse Father    Mental illness Neg Hx     Social History   Tobacco Use   Smoking status: Never   Smokeless tobacco: Never  Substance Use Topics   Alcohol use: Yes    Comment: Chronic ETOH use and abuse   Drug  use: No    Home Medications Prior to Admission medications   Medication Sig Start Date End Date Taking? Authorizing Provider  escitalopram (LEXAPRO) 10 MG tablet Take 1 tablet (10 mg total) by mouth daily. 01/17/20   Lewie Chamber, MD  Multiple Vitamin (MULTIVITAMIN WITH MINERALS) TABS tablet Take 1 tablet by mouth daily. 01/17/20   Lewie Chamber, MD    Allergies    Patient has no known allergies.  Review of Systems   Review of Systems  All other systems reviewed and are negative.  Physical Exam Updated Vital Signs BP (!) 165/101   Pulse (!) 117   Temp 98.3 F (36.8 C)   Resp (!) 24   SpO2 100%   Physical Exam Vitals and nursing note reviewed.  Constitutional:      General: He is not in acute distress.    Appearance: He is well-developed. He is not diaphoretic.  HENT:     Head: Normocephalic and atraumatic.  Cardiovascular:     Rate and Rhythm: Regular rhythm. Tachycardia present.     Heart sounds: No murmur heard.   No friction rub.  Pulmonary:     Effort: Pulmonary effort is normal. No respiratory distress.     Breath sounds: Normal breath sounds. No wheezing or rales.  Abdominal:     General: Bowel sounds are normal. There is no distension.  Palpations: Abdomen is soft.     Tenderness: There is no abdominal tenderness.  Musculoskeletal:        General: Normal range of motion.     Cervical back: Normal range of motion and neck supple.  Skin:    General: Skin is warm and dry.  Neurological:     Mental Status: He is alert and oriented to person, place, and time.     Coordination: Coordination normal.     Comments: Patient is awake and alert.  He moves all extremities, but is somewhat tremulous.    ED Results / Procedures / Treatments   Labs (all labs ordered are listed, but only abnormal results are displayed) Labs Reviewed - No data to display  EKG None  Radiology No results found.  Procedures Procedures   Medications Ordered in ED Medications   sodium chloride 0.9 % bolus 1,000 mL (has no administration in time range)  LORazepam (ATIVAN) injection 2 mg (has no administration in time range)    ED Course  I have reviewed the triage vital signs and the nursing notes.  Pertinent labs & imaging results that were available during my care of the patient were reviewed by me and considered in my medical decision making (see chart for details).    MDM Rules/Calculators/A&P  Patient presenting with shakiness, rapid heartbeat and elevated blood pressure.  He has history of alcohol abuse and quit drinking yesterday.  This is similar to prior presentations to where he has been in alcohol withdrawal.  Patient given Ativan, but remains shaky and tachycardic.  Patient to be admitted for treatment of alcohol withdrawal and prevention of DTs.  Final Clinical Impression(s) / ED Diagnoses Final diagnoses:  None    Rx / DC Orders ED Discharge Orders     None        Geoffery Lyons, MD 08/11/20 [phone]

## 2020-08-10 NOTE — ED Notes (Signed)
Patient states he wants to detox from alcohol. He states his last drink was "shot of liquor" before coming in. Patient has visible tremors but is alert and oriented X4. Patient is pleasant and cooperative.

## 2020-08-10 NOTE — Plan of Care (Signed)

## 2020-08-11 DIAGNOSIS — F10230 Alcohol dependence with withdrawal, uncomplicated: Secondary | ICD-10-CM

## 2020-08-11 DIAGNOSIS — D696 Thrombocytopenia, unspecified: Secondary | ICD-10-CM

## 2020-08-11 MED ORDER — LISINOPRIL 20 MG PO TABS
40.0000 mg | ORAL_TABLET | Freq: Every day | ORAL | Status: DC
  Administered 2020-08-11 – 2020-08-13 (×3): 40 mg via ORAL
  Filled 2020-08-11 (×5): qty 2

## 2020-08-11 NOTE — Progress Notes (Signed)
Progress Note    Alex Logan   [id number]  DOB: 31-Aug-1965  DOA: 08/10/2020     1  [facility]P: [facility]  CC: N/V, tremors  Hospital Course: Alex Logan is a 55 yo male with PMH hypertension, ongoing alcohol abuse, GERD, anxiety/depression, fatty liver who presented with tremors and nausea/vomiting due to early onset of alcohol withdrawal.  He has been drinking almost up to 1/5 of liquor per day and had decided to detox himself. He presented with nausea, vomiting, palpitations, tremors.  Blood pressure was also elevated.  He had stated that he was recently started on amlodipine within the past 1 month but blood pressures at home were also still above goal. During hospitalization he endorsed wishes for pursuing further detox outpatient.  Interval History:  No events overnight.  Laying in bed comfortable this morning but still having some ongoing tremors noted in his hands.  He wishes to go to [facility] at discharge he says.  ROS: Constitutional: negative for chills and fevers, Respiratory: negative for cough and pneumonia, Cardiovascular: negative for chest pain, and Gastrointestinal: negative for abdominal pain  Assessment & Plan: * Alcohol withdrawal (HCC) - Continue on CIWA protocol for withdrawal.  Scores are starting to downtrend.  Still having some tremors when seen this morning - Tentative plan would be for discharge on Thursday or Friday.  Patient plans to go to [facility] at time of discharge  HTN (hypertension) - Recently started on amlodipine outpatient but patient still states that pressures have been above goal - Continue amlodipine while inpatient - Add on lisinopril and monitor pressure response.  Suspect part of his elevation is due to active withdrawal  Thrombocytopenia (HCC) - Intermittent but likely due to ongoing chronic alcohol use - No signs of bleeding  Depression with anxiety - Continue Lexapro  Fatty liver, alcoholic - Alcohol  cessation strongly recommended    Old records reviewed in assessment of this patient  Antimicrobials:   DVT prophylaxis: enoxaparin (LOVENOX) injection 40 mg Start: 08/10/20 1000   Code Status:   Code Status: Full Code Family Communication:   Disposition Plan: Status is: Inpatient  Remains inpatient appropriate because:IV treatments appropriate due to intensity of illness or inability to take PO and Inpatient level of care appropriate due to severity of illness  Dispo: The patient is from: Home              Anticipated d/c is to:  [facility] tentatively on Thursday or Friday              Patient currently is not medically stable to d/c.   Difficult to place patient No      Risk of unplanned readmission score: Unplanned Admission- Pilot do not use: 11.02   Objective: Blood pressure (!) 145/102, pulse (!) 113, temperature 99.6 F (37.6 C), temperature source Oral, resp. rate 20, height 5\' 10"  (1.778 m), weight 75.9 kg, SpO2 98 %.  Examination: General appearance: alert, cooperative, no distress, and mild tremor appreciated in bilateral hands Head: Normocephalic, without obvious abnormality, atraumatic Eyes:  EOMI Lungs: clear to auscultation bilaterally Heart:  Tachycardic, regular rhythm, S1-S2 present.  No appreciated murmurs Abdomen: normal findings: bowel sounds normal and soft, non-tender Extremities:  No edema Skin: mobility and turgor normal Neurologic: Grossly normal  Consultants:    Procedures:    Data Reviewed: I have personally reviewed following labs and imaging studies No results found for this or any previous visit (from the past 24 hour(s)).  Recent Results (from the past 240 hour(s))  SARS CORONAVIRUS 2 (TAT 6-24 HRS) Nasopharyngeal Nasopharyngeal Swab     Status: None   Collection Time: 08/10/20  9:45 AM   Specimen: Nasopharyngeal Swab  Result Value Ref Range Status   SARS Coronavirus 2 NEGATIVE NEGATIVE Final    Comment:  (NOTE) SARS-CoV-2 target nucleic acids are NOT DETECTED.  The SARS-CoV-2 RNA is generally detectable in upper and lower respiratory specimens during the acute phase of infection. Negative results do not preclude SARS-CoV-2 infection, do not rule out co-infections with other pathogens, and should not be used as the sole basis for treatment or other patient management decisions. Negative results must be combined with clinical observations, patient history, and epidemiological information. The expected result is Negative.  Fact Sheet for Patients: HairSlick.no  Fact Sheet for Healthcare Providers: quierodirigir.com  This test is not yet approved or cleared by the [place] FDA and  has been authorized for detection and/or diagnosis of SARS-CoV-2 by FDA under an Emergency Use Authorization (EUA). This EUA will remain  in effect (meaning this test can be used) for the duration of the COVID-19 declaration under Se ction 564(b)(1) of the Act, 21 U.S.C. section 360bbb-3(b)(1), unless the authorization is terminated or revoked sooner.  Performed at [facility], [address], [place], [place] [address]      Radiology Studies: No results found. No orders to display    Scheduled Meds:  amLODipine  10 mg Oral Daily   enoxaparin (LOVENOX) injection  40 mg Subcutaneous Q24H   escitalopram  10 mg Oral Daily   folic acid  1 mg Oral Daily   lisinopril  40 mg Oral Daily   LORazepam  0-4 mg Oral Q6H   Followed by   Melene Muller ON 08/12/2020] LORazepam  0-4 mg Oral Q12H   multivitamin with minerals  1 tablet Oral Daily   pantoprazole  40 mg Oral Daily   thiamine  100 mg Oral Daily   Or   thiamine  100 mg Intravenous Daily   PRN Meds: LORazepam **OR** LORazepam, naproxen, ondansetron (ZOFRAN) IV Continuous Infusions:   LOS: 1 day  Time spent: Greater than 50% of the 35 minute visit was spent in counseling/coordination of care  for the patient as laid out in the A&P.   Lewie Chamber, MD [facility] 08/11/2020, 2:47 PM

## 2020-08-11 NOTE — TOC Initial Note (Signed)
Transition of Care [facility]) - Initial/Assessment Note    Patient Details  Name: Alex Logan MRN: [id number] Date of Birth: 02/13/1965  [facility] CM/SW Contact:    Alex Rogue, LCSW Phone Number: 08/11/2020, 3:38 PM  Clinical Narrative:   Patient seen in follow up to MD consult for alcohol use.  Alex Logan admits alcohol use has gotten away from him, was at [facility] 3 years ago and has already reached out to them about returning, decided to come here for detox first.  Ex-wife and daughters are supports.  Wanted me to reach out to [facility] and make sure they have all information they need.  Spoke with Alex Logan, who confirms that they have patient slated to come in on Friday.  H and P and today's MD note FAXed. TOC will continue to follow during the course of hospitalization.                 Expected Discharge Plan: Home/Self Care Barriers to Discharge: No Barriers Identified   Patient Goals and CMS Choice        Expected Discharge Plan and Services Expected Discharge Plan: Home/Self Care In-house Referral: Clinical Social Work     Living arrangements for the past 2 months: Single Family Home                                      Prior Living Arrangements/Services Living arrangements for the past 2 months: Single Family Home Lives with:: Self Patient language and need for interpreter reviewed:: Yes        Need for Family Participation in Patient Care: No (Comment) Care giver support system in place?: Yes (comment)   Criminal Activity/Legal Involvement Pertinent to Current Situation/Hospitalization: No - Comment as needed  Activities of Daily Living Home Assistive Devices/Equipment: None ADL Screening (condition at time of admission) Patient's cognitive ability adequate to safely complete daily activities?: Yes Is the patient deaf or have difficulty hearing?: No Does the patient have difficulty seeing, even when wearing glasses/contacts?: No Does the  patient have difficulty concentrating, remembering, or making decisions?: No Patient able to express need for assistance with ADLs?: Yes Does the patient have difficulty dressing or bathing?: No Independently performs ADLs?: Yes (appropriate for developmental age) Does the patient have difficulty walking or climbing stairs?: Yes (secondary to tremors) Weakness of Legs: Both Weakness of Arms/Hands: None  Permission Sought/Granted Permission sought to share information with : Facility Industrial/product designer granted to share information with : Yes, Verbal Permission Granted  Share Information with NAME: Alex Logan           Emotional Assessment Appearance:: Appears stated age Attitude/Demeanor/Rapport: Engaged Affect (typically observed): Appropriate Orientation: : Oriented to Self, Oriented to Place, Oriented to  Time, Oriented to Situation Alcohol / Substance Use: Alcohol Use Psych Involvement: No (comment)  Admission diagnosis:  Alcohol withdrawal (HCC) [F10.239] Alcohol withdrawal syndrome with complication (HCC) [F10.239] Patient Active Problem List   Diagnosis Date Noted   Thrombocytopenia (HCC) 08/11/2020   Tachycardia 01/14/2020   Elevated LFTs 05/27/2017   Muscular abdominal pain in right flank 05/27/2017   Fatty liver, alcoholic 05/27/2017   Depression with anxiety 05/27/2017   Pancytopenia (HCC) 05/27/2017   SVT (supraventricular tachycardia) (HCC)    HTN (hypertension) 05/23/2017   Acute vomiting    Pressure injury of skin 11/12/2016   Alcohol withdrawal (HCC) 11/10/2016   SIRS (  systemic inflammatory response syndrome) (HCC) 11/10/2016   High anion gap metabolic acidosis 11/10/2016   Lactic acidosis 11/10/2016   Starvation ketoacidosis 11/10/2016   Alcohol use disorder, severe, dependence (HCC) 09/27/2016   Alcohol abuse with alcohol-induced mood disorder (HCC) 09/22/2016   PCP:  [facility]:   [facility]  [id number] - [place], [place] - [address] [place] [address] Phone: [phone] Fax: [phone]     Social Determinants of Health (SDOH) Interventions    Readmission Risk Interventions No flowsheet data found.

## 2020-08-11 NOTE — Hospital Course (Signed)
Mr. Bastin is a 55 yo male with PMH hypertension, ongoing alcohol abuse, GERD, anxiety/depression, fatty liver who presented with tremors and nausea/vomiting due to early onset of alcohol withdrawal.  He has been drinking almost up to 1/5 of liquor per day and had decided to detox himself. He presented with nausea, vomiting, palpitations, tremors.  Blood pressure was also elevated.  He had stated that he was recently started on amlodipine within the past 1 month but blood pressures at home were also still above goal. During hospitalization he endorsed wishes for pursuing further detox outpatient.

## 2020-08-11 NOTE — Assessment & Plan Note (Signed)
-   Intermittent but likely due to ongoing chronic alcohol use - No signs of bleeding

## 2020-08-11 NOTE — Assessment & Plan Note (Addendum)
-   Recently started on amlodipine outpatient but patient still states that pressures have been above goal - Continue amlodipine while inpatient - Add on lisinopril and monitor pressure response.  Suspect part of his elevation is due to active withdrawal -Blood pressure noted at time of discharge and patient had just received morning pills when pressure was taken.  His pressure curve typically downtrends after morning medications.  Due to suspicion that underlying alcohol use and withdrawal is contributing, informed patient we would not escalate further at this time due to presumption that pressures should further improve as he remains abstinent from alcohol.  He will also undergo ongoing blood pressure monitoring at [facility] and regimen can be further adjusted if needed.  He understands need for following up with outpatient PCP at time of discharge as well for any further medication adjustments.

## 2020-08-11 NOTE — Assessment & Plan Note (Signed)
Continue Lexapro

## 2020-08-11 NOTE — Assessment & Plan Note (Addendum)
-   Continue on CIWA protocol for withdrawal.  Scores are starting to downtrend.  Tremors improving - Tentative plan is for discharge on Friday.  Patient plans to go to [facility] at time of discharge

## 2020-08-11 NOTE — Assessment & Plan Note (Signed)
-   Alcohol cessation strongly recommended

## 2020-08-12 LAB — CBC WITH DIFFERENTIAL/PLATELET
Abs Immature Granulocytes: 0.01 10*3/uL (ref 0.00–0.07)
Basophils Absolute: 0.1 10*3/uL (ref 0.0–0.1)
Basophils Relative: 1 %
Eosinophils Absolute: 0.1 10*3/uL (ref 0.0–0.5)
Eosinophils Relative: 2 %
HCT: 44.1 % (ref 39.0–52.0)
Hemoglobin: 14.3 g/dL (ref 13.0–17.0)
Immature Granulocytes: 0 %
Lymphocytes Relative: 22 %
Lymphs Abs: 1.3 10*3/uL (ref 0.7–4.0)
MCH: 30.8 pg (ref 26.0–34.0)
MCHC: 32.4 g/dL (ref 30.0–36.0)
MCV: 95 fL (ref 80.0–100.0)
Monocytes Absolute: 0.7 10*3/uL (ref 0.1–1.0)
Monocytes Relative: 11 %
Neutro Abs: 3.9 10*3/uL (ref 1.7–7.7)
Neutrophils Relative %: 64 %
Platelets: 225 10*3/uL (ref 150–400)
RBC: 4.64 MIL/uL (ref 4.22–5.81)
RDW: 13 % (ref 11.5–15.5)
WBC: 6.1 10*3/uL (ref 4.0–10.5)
nRBC: 0 % (ref 0.0–0.2)

## 2020-08-12 LAB — BASIC METABOLIC PANEL
Anion gap: 7 (ref 5–15)
BUN: 12 mg/dL (ref 6–20)
CO2: 25 mmol/L (ref 22–32)
Calcium: 9.3 mg/dL (ref 8.9–10.3)
Chloride: 105 mmol/L (ref 98–111)
Creatinine, Ser: 0.88 mg/dL (ref 0.61–1.24)
GFR, Estimated: >60 mL/min (ref >60)
Glucose, Bld: 101 mg/dL — ABNORMAL HIGH (ref 70–99)
Potassium: 4.2 mmol/L (ref 3.5–5.1)
Sodium: 137 mmol/L (ref 135–145)

## 2020-08-12 LAB — MAGNESIUM: Magnesium: 1.8 mg/dL (ref 1.7–2.4)

## 2020-08-12 MED ORDER — LABETALOL HCL 5 MG/ML IV SOLN: 10.0000 mg | INTRAVENOUS | Status: DC | PRN

## 2020-08-12 MED ORDER — HYDRALAZINE HCL 25 MG PO TABS
25.0000 mg | ORAL_TABLET | ORAL | Status: DC | PRN
Start: 2020-08-12 — End: 2020-08-13
  Administered 2020-08-12: 25 mg via ORAL
  Filled 2020-08-12: qty 1

## 2020-08-12 NOTE — Progress Notes (Signed)
Progress Note    Alex Logan   [id number]  DOB: 1965/05/07  DOA: 08/10/2020     2  [facility]P: [facility]  CC: N/V, tremors  Hospital Course: Alex Logan is a 55 yo male with PMH hypertension, ongoing alcohol abuse, GERD, anxiety/depression, fatty liver who presented with tremors and nausea/vomiting due to early onset of alcohol withdrawal.  He has been drinking almost up to 1/5 of liquor per day and had decided to detox himself. He presented with nausea, vomiting, palpitations, tremors.  Blood pressure was also elevated.  He had stated that he was recently started on amlodipine within the past 1 month but blood pressures at home were also still above goal. During hospitalization he endorsed wishes for pursuing further detox outpatient.  Interval History:  No events overnight.  Tremors are little better this morning.  Blood pressure still slightly above goal.  Tentative plan is still for discharging tomorrow to [facility].   ROS: Constitutional: negative for chills and fevers, Respiratory: negative for cough and pneumonia, Cardiovascular: negative for chest pain, and Gastrointestinal: negative for abdominal pain  Assessment & Plan: * Alcohol withdrawal (HCC) - Continue on CIWA protocol for withdrawal.  Scores are starting to downtrend.  Tremors improving - Tentative plan is for discharge on Friday.  Patient plans to go to [facility] at time of discharge  HTN (hypertension) - Recently started on amlodipine outpatient but patient still states that pressures have been above goal - Continue amlodipine while inpatient - Add on lisinopril and monitor pressure response.  Suspect part of his elevation is due to active withdrawal  Thrombocytopenia (HCC) - Intermittent but likely due to ongoing chronic alcohol use - No signs of bleeding  Depression with anxiety - Continue Lexapro  Fatty liver, alcoholic - Alcohol cessation strongly recommended   Old records  reviewed in assessment of this patient  Antimicrobials:   DVT prophylaxis: enoxaparin (LOVENOX) injection 40 mg Start: 08/10/20 1000   Code Status:   Code Status: Full Code Family Communication:   Disposition Plan: Status is: Inpatient  Remains inpatient appropriate because:IV treatments appropriate due to intensity of illness or inability to take PO and Inpatient level of care appropriate due to severity of illness  Dispo: The patient is from: Home              Anticipated d/c is to:  [facility] tentatively on Thursday or Friday              Patient currently is not medically stable to d/c.   Difficult to place patient No  Risk of unplanned readmission score: Unplanned Admission- Pilot do not use: 9.76   Objective: Blood pressure (!) 139/109, pulse (!) 110, temperature 98.3 F (36.8 C), temperature source Oral, resp. rate 18, height 5\' 10"  (1.778 m), weight 75.9 kg, SpO2 97 %.  Examination: General appearance: alert, cooperative, no distress, and mild tremor appreciated in bilateral hands Head: Normocephalic, without obvious abnormality, atraumatic Eyes:  EOMI Lungs: clear to auscultation bilaterally Heart: regular rate and rhythm and S1, S2 normal Abdomen: normal findings: bowel sounds normal and soft, non-tender Extremities:  No edema Skin: mobility and turgor normal Neurologic: Grossly normal  Consultants:    Procedures:    Data Reviewed: I have personally reviewed following labs and imaging studies Results for orders placed or performed during the hospital encounter of 08/10/20 (from the past 24 hour(s))  Basic metabolic panel     Status: Abnormal   Collection Time: 08/12/20  5:31 AM  Result Value Ref Range   Sodium 137 135 - 145 mmol/L   Potassium 4.2 3.5 - 5.1 mmol/L   Chloride 105 98 - 111 mmol/L   CO2 25 22 - 32 mmol/L   Glucose, Bld 101 (H) 70 - 99 mg/dL   BUN 12 6 - 20 mg/dL   Creatinine, Ser 6.81 0.61 - 1.24 mg/dL   Calcium 9.3 8.9 - 27.5 mg/dL    GFR, Estimated >17 >00 mL/min   Anion gap 7 5 - 15  CBC with Differential/Platelet     Status: None   Collection Time: 08/12/20  5:31 AM  Result Value Ref Range   WBC 6.1 4.0 - 10.5 K/uL   RBC 4.64 4.22 - 5.81 MIL/uL   Hemoglobin 14.3 13.0 - 17.0 g/dL   HCT 17.4 94.4 - 96.7 %   MCV 95.0 80.0 - 100.0 fL   MCH 30.8 26.0 - 34.0 pg   MCHC 32.4 30.0 - 36.0 g/dL   RDW 59.1 63.8 - 46.6 %   Platelets 225 150 - 400 K/uL   nRBC 0.0 0.0 - 0.2 %   Neutrophils Relative % 64 %   Neutro Abs 3.9 1.7 - 7.7 K/uL   Lymphocytes Relative 22 %   Lymphs Abs 1.3 0.7 - 4.0 K/uL   Monocytes Relative 11 %   Monocytes Absolute 0.7 0.1 - 1.0 K/uL   Eosinophils Relative 2 %   Eosinophils Absolute 0.1 0.0 - 0.5 K/uL   Basophils Relative 1 %   Basophils Absolute 0.1 0.0 - 0.1 K/uL   Immature Granulocytes 0 %   Abs Immature Granulocytes 0.01 0.00 - 0.07 K/uL  Magnesium     Status: None   Collection Time: 08/12/20  5:31 AM  Result Value Ref Range   Magnesium 1.8 1.7 - 2.4 mg/dL    Recent Results (from the past 240 hour(s))  SARS CORONAVIRUS 2 (TAT 6-24 HRS) Nasopharyngeal Nasopharyngeal Swab     Status: None   Collection Time: 08/10/20  9:45 AM   Specimen: Nasopharyngeal Swab  Result Value Ref Range Status   SARS Coronavirus 2 NEGATIVE NEGATIVE Final    Comment: (NOTE) SARS-CoV-2 target nucleic acids are NOT DETECTED.  The SARS-CoV-2 RNA is generally detectable in upper and lower respiratory specimens during the acute phase of infection. Negative results do not preclude SARS-CoV-2 infection, do not rule out co-infections with other pathogens, and should not be used as the sole basis for treatment or other patient management decisions. Negative results must be combined with clinical observations, patient history, and epidemiological information. The expected result is Negative.  Fact Sheet for Patients: HairSlick.no  Fact Sheet for Healthcare  Providers: quierodirigir.com  This test is not yet approved or cleared by the [place] FDA and  has been authorized for detection and/or diagnosis of SARS-CoV-2 by FDA under an Emergency Use Authorization (EUA). This EUA will remain  in effect (meaning this test can be used) for the duration of the COVID-19 declaration under Se ction 564(b)(1) of the Act, 21 U.S.C. section 360bbb-3(b)(1), unless the authorization is terminated or revoked sooner.  Performed at [facility], [address] [place], [place] [address]      Radiology Studies: No results found. No orders to display    Scheduled Meds:  amLODipine  10 mg Oral Daily   enoxaparin (LOVENOX) injection  40 mg Subcutaneous Q24H   escitalopram  10 mg Oral Daily   folic acid  1 mg Oral Daily  lisinopril  40 mg Oral Daily   LORazepam  0-4 mg Oral Q12H   multivitamin with minerals  1 tablet Oral Daily   pantoprazole  40 mg Oral Daily   thiamine  100 mg Oral Daily   Or   thiamine  100 mg Intravenous Daily   PRN Meds: hydrALAZINE, labetalol, LORazepam **OR** LORazepam, naproxen, ondansetron (ZOFRAN) IV Continuous Infusions:   LOS: 2 days  Time spent: Greater than 50% of the 35 minute visit was spent in counseling/coordination of care for the patient as laid out in the A&P.   Lewie Chamber, MD [facility] 08/12/2020, 2:03 PM

## 2020-08-13 DIAGNOSIS — I10 Essential (primary) hypertension: Secondary | ICD-10-CM

## 2020-08-13 DIAGNOSIS — F418 Other specified anxiety disorders: Secondary | ICD-10-CM

## 2020-08-13 MED ORDER — AMLODIPINE BESYLATE 10 MG PO TABS: 10.0000 mg | ORAL_TABLET | Freq: Every day | ORAL | 5 refills | Status: DC

## 2020-08-13 MED ORDER — LISINOPRIL 40 MG PO TABS: 40.0000 mg | ORAL_TABLET | Freq: Every day | ORAL | 5 refills | Status: DC

## 2020-08-13 NOTE — Progress Notes (Signed)
Patient discharging with belongings. Education on medication will be provided.

## 2020-08-13 NOTE — Discharge Summary (Signed)
Physician Discharge Summary   Alex Logan TDH:[id number] DOB: [birth year] DOA: 08/10/2020  PCP: [facility]  Admit date: 08/10/2020 Discharge date: 08/13/2020   Admitted From: home Disposition:  [facility] Discharging physician: Lewie Chamber, MD  Recommendations for Outpatient Follow-up:  Adjust BP regimen further as needed  Home Health:  Equipment/Devices:   Patient discharged to [facility] in Discharge Condition: stable Risk of unplanned readmission score: Unplanned Admission- Pilot do not use: 8.88  CODE STATUS: Full Diet recommendation:  Diet Orders (From admission, onward)     Start     Ordered   08/13/20 0000  Diet - low sodium heart healthy        08/13/20 1039   08/10/20 0901  Diet regular Room service appropriate? Yes; Fluid consistency: Thin  Diet effective now       Question Answer Comment  Room service appropriate? Yes   Fluid consistency: Thin      08/10/20 0902            Hospital Course: Alex Logan is a 55 yo male with PMH hypertension, ongoing alcohol abuse, GERD, anxiety/depression, fatty liver who presented with tremors and nausea/vomiting due to early onset of alcohol withdrawal.  He has been drinking almost up to 1/5 of liquor per day and had decided to detox himself. He presented with nausea, vomiting, palpitations, tremors.  Blood pressure was also elevated.  He had stated that he was recently started on amlodipine within the past 1 month but blood pressures at home were also still above goal. During hospitalization he endorsed wishes for pursuing further detox outpatient.  * Alcohol withdrawal (HCC) - Continue on CIWA protocol for withdrawal.  Scores are starting to downtrend.  Tremors improving - Tentative plan is for discharge on Friday.  Patient plans to go to [facility] at time of discharge  HTN (hypertension) - Recently started on amlodipine outpatient but patient still states that pressures have been above  goal - Continue amlodipine while inpatient - Add on lisinopril and monitor pressure response.  Suspect part of his elevation is due to active withdrawal -Blood pressure noted at time of discharge and patient had just received morning pills when pressure was taken.  His pressure curve typically downtrends after morning medications.  Due to suspicion that underlying alcohol use and withdrawal is contributing, informed patient we would not escalate further at this time due to presumption that pressures should further improve as he remains abstinent from alcohol.  He will also undergo ongoing blood pressure monitoring at [facility] and regimen can be further adjusted if needed.  He understands need for following up with outpatient PCP at time of discharge as well for any further medication adjustments.  Thrombocytopenia (HCC) - Intermittent but likely due to ongoing chronic alcohol use - No signs of bleeding  Depression with anxiety - Continue Lexapro  Fatty liver, alcoholic - Alcohol cessation strongly recommended    The patient's chronic medical conditions were treated accordingly per the patient's home medication regimen except as noted.  On day of discharge, patient was felt deemed stable for discharge. Patient/family member advised to call PCP or come back to ER if needed.   Principal Diagnosis: Alcohol withdrawal (HCC)  Discharge Diagnoses: Active Hospital Problems   Diagnosis Date Noted   Alcohol withdrawal (HCC) 11/10/2016    Priority: High   HTN (hypertension) 05/23/2017    Priority: Medium   Thrombocytopenia (HCC) 08/11/2020   Depression with anxiety 05/27/2017   Fatty liver, alcoholic 05/27/2017  Resolved Hospital Problems  No resolved problems to display.    Discharge Instructions     Diet - low sodium heart healthy   Complete by: As directed    Increase activity slowly   Complete by: As directed       Allergies as of 08/13/2020   No Known Allergies       Medication List     STOP taking these medications    naproxen sodium 220 MG tablet Commonly known as: ALEVE       TAKE these medications    amLODipine 10 MG tablet Commonly known as: NORVASC Take 1 tablet (10 mg total) by mouth daily.   escitalopram 10 MG tablet Commonly known as: LEXAPRO Take 1 tablet (10 mg total) by mouth daily.   lisinopril 40 MG tablet Commonly known as: ZESTRIL Take 1 tablet (40 mg total) by mouth daily. Start taking on: August 14, 2020   multivitamin with minerals Tabs tablet Take 1 tablet by mouth daily.   pantoprazole 40 MG tablet Commonly known as: PROTONIX Take 40 mg by mouth daily.        No Known Allergies  Consultations:   Discharge Exam: BP (!) 151/116   Pulse (!) 109   Temp 97.8 F (36.6 C) (Oral)   Resp 18   Ht 5\' 10"  (1.778 m)   Wt 75.9 kg   SpO2 99%   BMI 24.01 kg/m  General appearance: alert, cooperative, no distress, no further tremor  Head: Normocephalic, without obvious abnormality, atraumatic Eyes:  EOMI Lungs: clear to auscultation bilaterally Heart: regular rate and rhythm and S1, S2 normal Abdomen: normal findings: bowel sounds normal and soft, non-tender Extremities:  No edema Skin: mobility and turgor normal Neurologic: Grossly normal  The results of significant diagnostics from this hospitalization (including imaging, microbiology, ancillary and laboratory) are listed below for reference.   Microbiology: Recent Results (from the past 240 hour(s))  SARS CORONAVIRUS 2 (TAT 6-24 HRS) Nasopharyngeal Nasopharyngeal Swab     Status: None   Collection Time: 08/10/20  9:45 AM   Specimen: Nasopharyngeal Swab  Result Value Ref Range Status   SARS Coronavirus 2 NEGATIVE NEGATIVE Final    Comment: (NOTE) SARS-CoV-2 target nucleic acids are NOT DETECTED.  The SARS-CoV-2 RNA is generally detectable in upper and lower respiratory specimens during the acute phase of infection. Negative results do not preclude  SARS-CoV-2 infection, do not rule out co-infections with other pathogens, and should not be used as the sole basis for treatment or other patient management decisions. Negative results must be combined with clinical observations, patient history, and epidemiological information. The expected result is Negative.  Fact Sheet for Patients: 10/10/20  Fact Sheet for Healthcare Providers: HairSlick.no  This test is not yet approved or cleared by the [place].[employer] FDA and  has been authorized for detection and/or diagnosis of SARS-CoV-2 by FDA under an Emergency Use Authorization (EUA). This EUA will remain  in effect (meaning this test can be used) for the duration of the COVID-19 declaration under Se ction 564(b)(1) of the Act, 21 U.S.C. section 360bbb-3(b)(1), unless the authorization is terminated or revoked sooner.  Performed at [facility], [address] [place], [place]      Labs: BNP (last 3 results) No results for input(s): BNP in the last 8760 hours. Basic Metabolic Panel: Recent Labs  Lab 08/10/20 0612 08/10/20 0945 08/12/20 0531  NA 134* 135 137  K 3.5 3.7 4.2  CL 98 101 105  CO2 21* 19* 25  GLUCOSE 118* 95 101*  BUN 12 10 12   CREATININE 0.91 0.74 0.88  CALCIUM 9.4 8.8* 9.3  MG  --  1.7 1.8  PHOS  --  2.7  --    Liver Function Tests: Recent Labs  Lab 08/10/20 0612 08/10/20 0945  AST 40 34  ALT 33 32  ALKPHOS 56 44  BILITOT 0.7 0.9  PROT 7.8 7.0  ALBUMIN 4.7 4.2   No results for input(s): LIPASE, AMYLASE in the last 168 hours. No results for input(s): AMMONIA in the last 168 hours. CBC: Recent Labs  Lab 08/10/20 0612 08/10/20 0945 08/12/20 0531  WBC 10.1 7.0 6.1  NEUTROABS 8.2*  --  3.9  HGB 15.3 14.0 14.3  HCT 45.0 41.8 44.1  MCV 91.6 93.9 95.0  PLT 305 111* 225   Cardiac Enzymes: No results for input(s): CKTOTAL, CKMB, CKMBINDEX, TROPONINI in the last 168  hours. BNP: Invalid input(s): POCBNP CBG: No results for input(s): GLUCAP in the last 168 hours. D-Dimer No results for input(s): DDIMER in the last 72 hours. Hgb A1c No results for input(s): HGBA1C in the last 72 hours. Lipid Profile No results for input(s): CHOL, HDL, LDLCALC, TRIG, CHOLHDL, LDLDIRECT in the last 72 hours. Thyroid function studies No results for input(s): TSH, T4TOTAL, T3FREE, THYROIDAB in the last 72 hours.  Invalid input(s): FREET3 Anemia work up No results for input(s): VITAMINB12, FOLATE, FERRITIN, TIBC, IRON, RETICCTPCT in the last 72 hours. Urinalysis    Component Value Date/Time   COLORURINE YELLOW 05/23/2017 1028   APPEARANCEUR CLEAR 05/23/2017 1028   LABSPEC 1.021 05/23/2017 1028   PHURINE 5.0 05/23/2017 1028   GLUCOSEU NEGATIVE 05/23/2017 1028   HGBUR SMALL (A) 05/23/2017 1028   BILIRUBINUR NEGATIVE 05/23/2017 1028   KETONESUR 20 (A) 05/23/2017 1028   PROTEINUR 100 (A) 05/23/2017 1028   UROBILINOGEN 0.2 12/06/2012 1725   NITRITE NEGATIVE 05/23/2017 1028   LEUKOCYTESUR NEGATIVE 05/23/2017 1028   Sepsis Labs Invalid input(s): PROCALCITONIN,  WBC,  LACTICIDVEN Microbiology Recent Results (from the past 240 hour(s))  SARS CORONAVIRUS 2 (TAT 6-24 HRS) Nasopharyngeal Nasopharyngeal Swab     Status: None   Collection Time: 08/10/20  9:45 AM   Specimen: Nasopharyngeal Swab  Result Value Ref Range Status   SARS Coronavirus 2 NEGATIVE NEGATIVE Final    Comment: (NOTE) SARS-CoV-2 target nucleic acids are NOT DETECTED.  The SARS-CoV-2 RNA is generally detectable in upper and lower respiratory specimens during the acute phase of infection. Negative results do not preclude SARS-CoV-2 infection, do not rule out co-infections with other pathogens, and should not be used as the sole basis for treatment or other patient management decisions. Negative results must be combined with clinical observations, patient history, and epidemiological information. The  expected result is Negative.  Fact Sheet for Patients: 10/10/20  Fact Sheet for Healthcare Providers: HairSlick.no  This test is not yet approved or cleared by the [employer] FDA and  has been authorized for detection and/or diagnosis of SARS-CoV-2 by FDA under an Emergency Use Authorization (EUA). This EUA will remain  in effect (meaning this test can be used) for the duration of the COVID-19 declaration under Se ction 564(b)(1) of the Act, 21 U.S.C. section 360bbb-3(b)(1), unless the authorization is terminated or revoked sooner.  Performed at [facility], [address] [place], [place]     Procedures/Studies: No results found.   Time coordinating discharge: Over 30 minutes    [id number], MD  [facility] 08/13/2020, 3:47 PM

## 2020-09-17 ENCOUNTER — Ambulatory Visit: Payer: 59 | Admitting: Nurse Practitioner

## 2020-11-15 ENCOUNTER — Encounter (HOSPITAL_COMMUNITY): Payer: Self-pay

## 2020-11-15 ENCOUNTER — Other Ambulatory Visit: Payer: Self-pay

## 2020-11-15 ENCOUNTER — Emergency Department (HOSPITAL_COMMUNITY)
Admission: EM | Admit: 2020-11-15 | Discharge: 2020-11-15 | Disposition: A | Discharge status: Home / Self Care | Payer: 59 | Attending: Emergency Medicine | Admitting: Emergency Medicine

## 2020-11-15 ENCOUNTER — Emergency Department (HOSPITAL_COMMUNITY): Payer: 59

## 2020-11-15 DIAGNOSIS — S4991XA Unspecified injury of right shoulder and upper arm, initial encounter: Secondary | ICD-10-CM | POA: Diagnosis present

## 2020-11-15 DIAGNOSIS — I10 Essential (primary) hypertension: Secondary | ICD-10-CM | POA: Diagnosis not present

## 2020-11-15 DIAGNOSIS — X58XXXA Exposure to other specified factors, initial encounter: Secondary | ICD-10-CM | POA: Insufficient documentation

## 2020-11-15 DIAGNOSIS — Z79899 Other long term (current) drug therapy: Secondary | ICD-10-CM | POA: Insufficient documentation

## 2020-11-15 DIAGNOSIS — S43014A Anterior dislocation of right humerus, initial encounter: Secondary | ICD-10-CM | POA: Diagnosis not present

## 2020-11-15 MED ORDER — KETAMINE HCL 10 MG/ML IJ SOLN
INTRAMUSCULAR | Status: AC
  Administered 2020-11-15: 10 mg
  Filled 2020-11-15: qty 1

## 2020-11-15 MED ORDER — SODIUM CHLORIDE 0.9 % IV BOLUS
500.0000 mL | Freq: Once | INTRAVENOUS | Status: AC
  Administered 2020-11-15: 500 mL via INTRAVENOUS

## 2020-11-15 MED ORDER — FENTANYL CITRATE PF 50 MCG/ML IJ SOSY
50.0000 ug | PREFILLED_SYRINGE | Freq: Once | INTRAMUSCULAR | Status: AC
  Administered 2020-11-15: 50 ug via INTRAVENOUS
  Filled 2020-11-15: qty 1

## 2020-11-15 MED ORDER — ONDANSETRON HCL 4 MG/2ML IJ SOLN
4.0000 mg | Freq: Once | INTRAMUSCULAR | Status: AC
  Administered 2020-11-15: 4 mg via INTRAVENOUS
  Filled 2020-11-15: qty 2

## 2020-11-15 MED ORDER — PROPOFOL 10 MG/ML IV BOLUS
0.5000 mg/kg | Freq: Once | INTRAVENOUS | Status: AC
  Administered 2020-11-15: 37.9 mg via INTRAVENOUS
  Filled 2020-11-15: qty 20

## 2020-11-15 NOTE — Discharge Instructions (Signed)
You need to follow up with the orthopeadist for shoulder surgery.  Wear the shoulder immobilizer for the next week.  Avoid lifting with the right arm.

## 2020-11-15 NOTE — ED Provider Notes (Signed)
[facility]-EMERGENCY DEPT Provider Note   CSN: [id number] Arrival date & time: 11/15/20  0500     History Chief Complaint  Patient presents with   shoulder dislocation    Alex Logan is a 55 y.o. male.  Patient is a 55 year old male with a history of prior alcohol abuse who is no longer drinking, depression and hypertension who presents today with severe pain in his right shoulder.  Patient reports that he was sleeping and rolled over in bed and felt a sudden severe pain 10 out of 10 in his right shoulder.  It does not radiate and is worse if he tries to move it.  He reports it feels like the last time he dislocated his shoulder.  The right shoulder is now been dislocated 3 times total.  The last time after the dislocation he did follow-up with orthopedics who reported that he had a tear and needed surgery.  He reports for the last 4 months to been doing really well so he had avoided surgery.  He denies any numbness or tingling in his hand.  He has no medication allergies.  The history is provided by the patient.      Past Medical History:  Diagnosis Date   Alcohol abuse    Anxiety    Depression    Hypertension     Patient Active Problem List   Diagnosis Date Noted   Thrombocytopenia (HCC) 08/11/2020   Tachycardia 01/14/2020   Elevated LFTs 05/27/2017   Muscular abdominal pain in right flank 05/27/2017   Fatty liver, alcoholic 05/27/2017   Depression with anxiety 05/27/2017   Pancytopenia (HCC) 05/27/2017   SVT (supraventricular tachycardia) (HCC)    HTN (hypertension) 05/23/2017   Acute vomiting    Pressure injury of skin 11/12/2016   Alcohol withdrawal (HCC) 11/10/2016   SIRS (systemic inflammatory response syndrome) (HCC) 11/10/2016   High anion gap metabolic acidosis 11/10/2016   Lactic acidosis 11/10/2016   Starvation ketoacidosis 11/10/2016   Alcohol use disorder, severe, dependence (HCC) 09/27/2016   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 Authorizing Provider  amLODipine (NORVASC) 10 MG tablet Take 1 tablet (10 mg total) by mouth daily. 08/13/20   Lewie Chamber, MD  escitalopram (LEXAPRO) 10 MG tablet Take 1 tablet (10 mg total) by mouth daily. 01/17/20   Lewie Chamber, MD  lisinopril (ZESTRIL) 40 MG tablet Take 1 tablet (40 mg total) by mouth daily. 08/14/20   Lewie Chamber, MD  Multiple Vitamin (MULTIVITAMIN WITH MINERALS) TABS tablet Take 1 tablet by mouth daily. 01/17/20   Lewie Chamber, MD  pantoprazole (PROTONIX) 40 MG tablet Take 40 mg by mouth daily. 08/04/20   [provider]    Allergies    Patient has no known allergies.  Review of Systems   Review of Systems  All other systems reviewed and are negative.  Physical Exam Updated Vital Signs BP (!) 118/107 (BP Location: Left Arm)   Pulse 63   Temp 97.9 F (36.6 C) (Oral)   Resp 18   Ht 5\' 10"  (1.778 m)   Wt 75.8 kg   SpO2 100%   BMI 23.96 kg/m   Physical Exam Vitals and nursing note reviewed.  Constitutional:      General: He is not in acute distress.    Appearance: He is well-developed.     Comments: Appears uncomfortable  HENT:     Head: Normocephalic and atraumatic.     Mouth/Throat:     Mouth: Mucous membranes are moist.  Eyes:     Conjunctiva/sclera: Conjunctivae normal.     Pupils: Pupils are equal, round, and reactive to light.  Cardiovascular:     Rate and Rhythm: Normal rate and regular  rhythm.     Pulses: Normal pulses.     Heart sounds: No murmur heard.    Comments: 2+right radial pulse Pulmonary:     Effort: Pulmonary effort is normal. No respiratory distress.     Breath sounds: Normal breath sounds. No wheezing or rales.  Musculoskeletal:        General: Tenderness and deformity present.     Right shoulder: Deformity, tenderness and bony tenderness present. Decreased range of motion. Normal strength. Normal pulse.     Cervical back: Normal range of motion and neck supple.  Skin:    General: Skin is warm and dry.     Findings: No erythema or rash.  Neurological:     Mental Status: He is alert and oriented to person, place, and time.     Comments: Sensation is intact in the right hand  Psychiatric:        Behavior: Behavior normal.    ED Results / Procedures / Treatments   Labs (all labs ordered are listed, but only abnormal results are displayed) Labs Reviewed - No data to display  EKG None  Radiology DG Shoulder 1 View Right  Result Date: 11/15/2020 CLINICAL DATA:  55 year old male with recurrent right shoulder dislocation. EXAM: RIGHT SHOULDER - 1 VIEW COMPARISON:  07/21/2020. FINDINGS: Single-view demonstrating abnormal alignment of the right humeral head and glenoid, likely and anterior, subcoracoid dislocation. No acute fracture identified. Ice bag resting on the right AC joint region. Chronic right 5th rib fracture. Negative visible right chest. IMPRESSION: Positive for right glenohumeral dislocation, probably anterior - subcoracoid based on this single view. No acute fracture identified. Electronically Signed   By: 07/23/2020 M.D.   On: 11/15/2020 06:02    Procedures .Sedation  Date/Time: 11/15/2020 10:31 AM Performed by: 13/07/2020, MD Authorized by: Gwyneth Sprout, MD   Consent:    Consent obtained:  Verbal   Consent given by:  Patient   Risks discussed:  Prolonged hypoxia resulting in organ damage, respiratory compromise necessitating  ventilatory assistance and intubation, inadequate sedation, nausea and vomiting   Alternatives discussed:  Analgesia without sedation Universal protocol:    Procedure explained and questions answered to patient or proxy's satisfaction: yes     Relevant documents present and verified: yes     Imaging studies available: yes     Immediately prior to procedure, a time out was called: yes     Patient identity confirmed:  Arm band and verbally with patient Indications:    Procedure performed:  Dislocation reduction   Procedure necessitating sedation performed by:  Physician performing sedation Pre-sedation  assessment:    Time since last food or drink:  6 hours   ASA classification: class 2 - patient with mild systemic disease     Mouth opening:  3 or more finger widths   Thyromental distance:  4 finger widths   Mallampati score:  I - soft palate, uvula, fauces, pillars visible   Neck mobility: normal     Pre-sedation assessments completed and reviewed: airway patency, cardiovascular function, hydration status, mental status, nausea/vomiting, pain level, respiratory function and temperature     Pre-sedation assessment completed:  11/15/2020 9:32 AM Immediate pre-procedure details:    Reassessment: Patient reassessed immediately prior to procedure     Reviewed: vital signs and NPO status     Verified: bag valve mask available, emergency equipment available, intubation equipment available, IV patency confirmed, oxygen available and suction available   Procedure details (see MAR for exact dosages):    Preoxygenation:  Nasal cannula   Sedation:  Propofol and ketamine   Intended level of sedation: deep   Analgesia:  Fentanyl   Intra-procedure monitoring:  Blood pressure monitoring, continuous capnometry, frequent LOC assessments, frequent vital sign checks, continuous pulse oximetry and cardiac monitor   Intra-procedure events: none     Total Provider sedation time (minutes):  20 Post-procedure  details:    Post-sedation assessment completed:  11/15/2020 10:33 AM   Attendance: Constant attendance by certified staff until patient recovered     Recovery: Patient returned to pre-procedure baseline     Post-sedation assessments completed and reviewed: airway patency, cardiovascular function, hydration status, mental status, nausea/vomiting, pain level, respiratory function and temperature     Patient is stable for discharge or admission: yes     Procedure completion:  Tolerated well, no immediate complications Reduction of dislocation  Date/Time: 11/15/2020 10:33 AM Performed by: Gwyneth Sprout, MD Authorized by: Gwyneth Sprout, MD  Consent: Verbal consent obtained. Consent given by: patient Imaging studies: imaging studies available Patient identity confirmed: verbally with patient  Sedation: Patient sedated: yes Sedation type: moderate (conscious) sedation Sedatives: ketamine and propofol Analgesia: fentanyl  Patient tolerance: patient tolerated the procedure well with no immediate complications Comments: Pt was difficult to sedate but once he was relaxed shoulder was easily reduced      Medications Ordered in ED Medications  fentaNYL (SUBLIMAZE) injection 50 mcg (has no administration in time range)  ondansetron (ZOFRAN) injection 4 mg (has no administration in time range)  propofol (DIPRIVAN) 10 mg/mL bolus/IV push 37.9 mg (has no administration in time range)  sodium chloride 0.9 % bolus 500 mL (has no administration in time range)    ED Course  I have reviewed the triage vital signs and the nursing notes.  Pertinent labs & imaging results that were available during my care of the patient were reviewed by me and considered in my medical decision making (see chart for details).    MDM Rules/Calculators/A&P                           Patient presenting today with recurrent right shoulder dislocation.  Neurovascularly intact at this time.  Imaging confirmed  right glenohumeral dislocation.  Reduction as above and patient was encouraged to follow back up with orthopedics as he will most likely need surgery for recurrent dislocations.  10:34 AM Shoulder reduced and pt sent home with immobilizer.  MDM   Amount and/or Complexity of Data Reviewed Tests in the radiology section of CPT: ordered and reviewed Independent visualization  of images, tracings, or specimens: yes    Final Clinical Impression(s) / ED Diagnoses Final diagnoses:  Anterior dislocation of right shoulder, initial encounter    Rx / DC Orders ED Discharge Orders     None        Gwyneth Sprout, MD 11/15/20 1037

## 2020-11-15 NOTE — ED Notes (Signed)
Conscious sedation being performed.

## 2020-11-15 NOTE — ED Triage Notes (Signed)
Pt states that he dislocated his right shoulder rolling over in the bed this morning.

## 2020-11-15 NOTE — Progress Notes (Signed)
Orthopedic Tech Progress Note Patient Details:  Alex Logan 1966-01-05 [id number]  Ortho Devices Type of Ortho Device: Shoulder immobilizer Ortho Device/Splint Location: right Ortho Device/Splint Interventions: Application   Post Interventions Patient Tolerated: Well Instructions Provided: Care of device  Saul Fordyce 11/15/2020, 11:31 AM

## 2020-11-15 NOTE — ED Notes (Addendum)
Time out was performed at 0935 by writer, Anitra Lauth MD, respiratory, and ortho for the right shoulder dislocation. Initial BP was 200/131 (151), 100% on 2L, 79 pulse, 18 RR. 40 of propofal was given at 0935 by [facility] MD. Another 20 of proprfal was given by Anitra Lauth MD due to patient still being conscious. Another 20 of proporfal was given to help the patient relax at 0937, [phone]. 30 of propofal was given at 0940. At this time BP is 188/117, 100% on 2L, 23 RR, 83 pulse. At this time shoulder is still dislocated and patient is tense. Another 30 of propofal was given to help patient relax at [phone]. 7.5 of ketamine was given at 0952. BP at this time was 144/103 (117), 100% on 2L, 20 RR, 85 pulse. 40 more of propofol was given when patient was able to relax and the shoulder was put into place at 0958. An xray was taken to confirm placement of the right shoulder and ortho placed sling on the right shoulder to stablize. Patient is resting comfortably. Current vital signs are 156/92, 100% on 2L, 16 RR, 71 pulse

## 2021-07-18 DIAGNOSIS — K219 Gastro-esophageal reflux disease without esophagitis: Secondary | ICD-10-CM | POA: Diagnosis not present

## 2021-07-18 DIAGNOSIS — Z125 Encounter for screening for malignant neoplasm of prostate: Secondary | ICD-10-CM | POA: Diagnosis not present

## 2021-07-18 DIAGNOSIS — I10 Essential (primary) hypertension: Secondary | ICD-10-CM | POA: Diagnosis not present

## 2021-07-18 DIAGNOSIS — F1011 Alcohol abuse, in remission: Secondary | ICD-10-CM | POA: Diagnosis not present

## 2021-07-26 DIAGNOSIS — M25561 Pain in right knee: Secondary | ICD-10-CM | POA: Diagnosis not present

## 2021-07-26 DIAGNOSIS — M25562 Pain in left knee: Secondary | ICD-10-CM | POA: Diagnosis not present

## 2021-07-28 ENCOUNTER — Ambulatory Visit
Admission: RE | Admit: 2021-07-28 | Discharge: 2021-07-28 | Disposition: A | Discharge status: Home / Self Care | Payer: BC Managed Care – PPO | Source: Ambulatory Visit | Attending: Sports Medicine | Admitting: Sports Medicine

## 2021-07-28 ENCOUNTER — Other Ambulatory Visit: Payer: Self-pay | Admitting: Sports Medicine

## 2021-07-28 DIAGNOSIS — R52 Pain, unspecified: Secondary | ICD-10-CM

## 2021-07-28 DIAGNOSIS — M1711 Unilateral primary osteoarthritis, right knee: Secondary | ICD-10-CM | POA: Diagnosis not present

## 2021-07-28 DIAGNOSIS — M1712 Unilateral primary osteoarthritis, left knee: Secondary | ICD-10-CM | POA: Diagnosis not present

## 2021-08-24 DIAGNOSIS — I10 Essential (primary) hypertension: Secondary | ICD-10-CM | POA: Diagnosis not present

## 2021-08-25 DIAGNOSIS — M1712 Unilateral primary osteoarthritis, left knee: Secondary | ICD-10-CM | POA: Diagnosis not present

## 2021-08-27 ENCOUNTER — Encounter (HOSPITAL_BASED_OUTPATIENT_CLINIC_OR_DEPARTMENT_OTHER): Payer: Self-pay

## 2021-08-27 ENCOUNTER — Other Ambulatory Visit: Payer: Self-pay

## 2021-08-27 ENCOUNTER — Emergency Department (HOSPITAL_BASED_OUTPATIENT_CLINIC_OR_DEPARTMENT_OTHER): Payer: BC Managed Care – PPO | Admitting: Radiology

## 2021-08-27 ENCOUNTER — Emergency Department (HOSPITAL_BASED_OUTPATIENT_CLINIC_OR_DEPARTMENT_OTHER)
Admission: EM | Admit: 2021-08-27 | Discharge: 2021-08-27 | Disposition: A | Discharge status: Home / Self Care | Payer: BC Managed Care – PPO | Attending: Emergency Medicine | Admitting: Emergency Medicine

## 2021-08-27 ENCOUNTER — Emergency Department (HOSPITAL_BASED_OUTPATIENT_CLINIC_OR_DEPARTMENT_OTHER): Payer: BC Managed Care – PPO

## 2021-08-27 DIAGNOSIS — S43014A Anterior dislocation of right humerus, initial encounter: Secondary | ICD-10-CM | POA: Diagnosis not present

## 2021-08-27 DIAGNOSIS — S4991XA Unspecified injury of right shoulder and upper arm, initial encounter: Secondary | ICD-10-CM | POA: Diagnosis not present

## 2021-08-27 DIAGNOSIS — Z79899 Other long term (current) drug therapy: Secondary | ICD-10-CM | POA: Insufficient documentation

## 2021-08-27 DIAGNOSIS — X501XXA Overexertion from prolonged static or awkward postures, initial encounter: Secondary | ICD-10-CM | POA: Diagnosis not present

## 2021-08-27 DIAGNOSIS — I10 Essential (primary) hypertension: Secondary | ICD-10-CM | POA: Insufficient documentation

## 2021-08-27 MED ORDER — FENTANYL CITRATE PF 50 MCG/ML IJ SOSY
50.0000 ug | PREFILLED_SYRINGE | Freq: Once | INTRAMUSCULAR | Status: AC
  Administered 2021-08-27: 50 ug via INTRAVENOUS
  Filled 2021-08-27: qty 1

## 2021-08-27 MED ORDER — ONDANSETRON HCL 4 MG/2ML IJ SOLN
4.0000 mg | Freq: Once | INTRAMUSCULAR | Status: AC
  Administered 2021-08-27: 4 mg via INTRAVENOUS
  Filled 2021-08-27: qty 2

## 2021-08-27 MED ORDER — ETOMIDATE 2 MG/ML IV SOLN
0.1000 mg/kg | Freq: Once | INTRAVENOUS | Status: AC
  Administered 2021-08-27: 3 mg via INTRAVENOUS

## 2021-08-27 MED ORDER — ETOMIDATE 2 MG/ML IV SOLN
INTRAVENOUS | Status: AC
  Administered 2021-08-27: 2 mg
  Filled 2021-08-27: qty 10

## 2021-08-27 MED ORDER — FENTANYL CITRATE PF 50 MCG/ML IJ SOSY
50.0000 ug | PREFILLED_SYRINGE | Freq: Once | INTRAMUSCULAR | Status: AC
Start: 2021-08-27 — End: 2021-08-27
  Administered 2021-08-27: 50 ug via INTRAVENOUS
  Filled 2021-08-27: qty 1

## 2021-08-27 NOTE — ED Provider Notes (Signed)
Physical Exam  BP (!) 160/105   Pulse 85   Temp 99 F (37.2 C) (Oral)   Resp (!) 22   Wt 74.8 kg   SpO2 100%   BMI 23.68 kg/m   Physical Exam  Procedures  .Sedation  Date/Time: 08/27/2021 2:18 PM  Performed by: Derwood Kaplan, MD Authorized by: Derwood Kaplan, MD   Consent:    Consent obtained:  Written   Consent given by:  Patient   Risks discussed:  Allergic reaction, prolonged hypoxia resulting in organ damage, prolonged sedation necessitating reversal, dysrhythmia, inadequate sedation, respiratory compromise necessitating ventilatory assistance and intubation, nausea and vomiting   Alternatives discussed:  Analgesia without sedation Universal protocol:    Procedure explained and questions answered to patient or proxy's satisfaction: yes     Relevant documents present and verified: yes     Test results available: yes     Imaging studies available: yes     Site/side marked: yes     Immediately prior to procedure, a time out was called: yes     Patient identity confirmed:  Arm band Indications:    Procedure performed:  Fracture reduction   Procedure necessitating sedation performed by:  Physician performing sedation Pre-sedation assessment:    Time since last food or drink:  4 hours   ASA classification: class 3 - patient with severe systemic disease     Mouth opening:  3 or more finger widths   Mallampati score:  II - soft palate, uvula, fauces visible   Neck mobility: normal     Pre-sedation assessments completed and reviewed: airway patency, cardiovascular function, hydration status, mental status, nausea/vomiting, pain level, respiratory function and temperature     Pre-sedation assessment completed:  08/27/2021 1:29 PM Immediate pre-procedure details:    Reassessment: Patient reassessed immediately prior to procedure     Reviewed: vital signs, relevant labs/tests and NPO status     Verified: bag valve mask available, emergency equipment available, intubation  equipment available, IV patency confirmed, oxygen available and suction available   Procedure details (see MAR for exact dosages):    Preoxygenation:  Nasal cannula   Sedation:  Etomidate   Intended level of sedation: deep   Analgesia:  Fentanyl   Intra-procedure monitoring:  Blood pressure monitoring, continuous capnometry, frequent LOC assessments, frequent vital sign checks, continuous pulse oximetry and cardiac monitor   Intra-procedure events: none     Total Provider sedation time (minutes):  15 Post-procedure details:    Post-sedation assessment completed:  08/27/2021 2:20 PM   Attendance: Constant attendance by certified staff until patient recovered     Recovery: Patient returned to pre-procedure baseline     Post-sedation assessments completed and reviewed: airway patency, cardiovascular function, mental status, nausea/vomiting, pain level and respiratory function     Patient is stable for discharge or admission: yes     Procedure completion:  Tolerated well, no immediate complications   ED Course / MDM    Medical Decision Making Amount and/or Complexity of Data Reviewed Radiology: ordered.  Risk Prescription drug management.   56 year old male with history of recurrent shoulder dislocation comes in with chief complaint of shoulder dislocation.  He was given etomidate, total of 14 mg along with 50 mcg of fentanyl.  Satisfactory reduction completed.  Orthopedic follow-up will be recommended.  Patient has history of heavy alcohol use, which is why he likely required more than the recommended dose for procedural sedation for etomidate.       Derwood Kaplan, MD 08/27/21  1421  

## 2021-08-27 NOTE — ED Notes (Signed)
Dc instructions reviewed with patient. Patient voiced understanding. Dc with belongings.  °

## 2021-08-27 NOTE — ED Triage Notes (Signed)
He tells me he felt his right shoulder "dislocate" while putting on a t-shirt. He states he has a remote hx of this and has seen an orthopedist, who "mentioned the possibility of needing a surgery".

## 2021-08-27 NOTE — ED Notes (Signed)
Patient placed on ETCO2 w/ 2 L bleed in for procedure. Patient ETCO2 34-38 mmHg throughout procedure. Patient on RA w/ O2 sats of 100%.

## 2021-08-27 NOTE — Discharge Instructions (Addendum)
Your shoulder was reduced or put back into place today.  X-ray did not show any evidence of fracture.  You were given a shoulder immobilizer.  Please follow-up with your sports medicine doctor given this is a recurrent issue for you they can talk about definitive management.  If you have any worsening symptoms you can return to the emergency room. You state you have a sports medicine doctor to follow-up with.

## 2021-08-27 NOTE — ED Provider Notes (Signed)
[facility] EMERGENCY DEPT Provider Note   CSN: [id number] Arrival date & time: 08/27/21  1202     History  Chief Complaint  Patient presents with  . Shoulder Pain    Alex Logan is a 56 y.o. male.  56 year old male with history of multiple right shoulder dislocations presents today for evaluation of concern for right shoulder dislocation that occurred around 930 this morning.  Patient reports he was putting on a T-shirt which was relatively tight and he rolled his shoulder to maneuver to get his arm in when he felt his shoulder pop.  Denies any other injury.  The history is provided by the patient. No language interpreter was used.       Home Medications Prior to Admission medications   Medication Sig Start Date End Date Taking? Authorizing Provider  amLODipine (NORVASC) 10 MG tablet Take 1 tablet (10 mg total) by mouth daily. 08/13/20   Dwyane Dee, MD  escitalopram (LEXAPRO) 20 MG tablet Take 20 mg by mouth daily. 11/08/20   [provider]  ibuprofen (ADVIL) 200 MG tablet Take 600 mg by mouth every 6 (six) hours as needed for mild pain.    [provider]  lisinopril (ZESTRIL) 40 MG tablet Take 1 tablet (40 mg total) by mouth daily. 08/14/20   Dwyane Dee, MD  Multiple Vitamin (MULTIVITAMIN WITH MINERALS) TABS tablet Take 1 tablet by mouth daily. 01/17/20   Dwyane Dee, MD  naltrexone (DEPADE) 50 MG tablet Take 50 mg by mouth daily. 10/18/20   [provider]  pantoprazole (PROTONIX) 40 MG tablet Take 40 mg by mouth daily. 08/04/20   [provider]  propranolol ER (INDERAL LA) 80 MG 24 hr capsule Take 80 mg by mouth daily. 10/18/20   [provider]      Allergies    Patient has no known allergies.    Review of Systems   Review of Systems  Constitutional:  Negative for fever.  Musculoskeletal:  Positive for arthralgias. Negative for joint swelling.  Neurological:  Negative for weakness and numbness.   All other systems reviewed and are negative.   Physical Exam Updated Vital Signs BP (!) 159/105 (BP Location: Left Arm)   Pulse 92   Temp 98.5 F (36.9 C) (Oral)   Resp 16   SpO2 100%  Physical Exam Vitals and nursing note reviewed.  Constitutional:      General: He is not in acute distress.    Appearance: Normal appearance. He is not ill-appearing.  HENT:     Head: Normocephalic and atraumatic.     Nose: Nose normal.  Eyes:     Conjunctiva/sclera: Conjunctivae normal.  Pulmonary:     Effort: Pulmonary effort is normal. No respiratory distress.  Musculoskeletal:        General: No deformity.     Comments: Right shoulder deformity noted.  Mild tenderness to palpation present of the right shoulder.  2+ radial pulse present.  Sensation intact in the right hand.  Range of motion limited in the right shoulder joint.  Skin:    Findings: No rash.  Neurological:     Mental Status: He is alert.     ED Results / Procedures / Treatments   Labs (all labs ordered are listed, but only abnormal results are displayed) Labs Reviewed - No data to display  EKG None  Radiology No results found.  Procedures Reduction of dislocation  Date/Time: 08/27/2021 1:48 PM  Performed by: Evlyn Courier, PA-C Authorized by: Deatra Canter,  Lene Mckay, PA-C  Consent: Verbal consent obtained. Written consent obtained. Consent given by: patient Patient understanding: patient states understanding of the procedure being performed Patient consent: the patient's understanding of the procedure matches consent given Procedure consent: procedure consent matches procedure scheduled Test results: test results available and properly labeled Imaging studies: imaging studies available Patient identity confirmed: verbally with patient and arm band Time out: Immediately prior to procedure a "time out" was called to verify the correct patient, procedure, equipment, support staff and site/side marked as  required.  Sedation: Patient sedated: yes Sedatives: etomidate Analgesia: fentanyl Sedation start date/time: 08/27/2021 1:31 PM Sedation end date/time: 08/27/2021 1:42 PM Vitals: Vital signs were monitored during sedation.  Patient tolerance: patient tolerated the procedure well with no immediate complications       Medications Ordered in ED Medications - No data to display  ED Course/ Medical Decision Making/ A&P Clinical Course as of 08/27/21 1430  Sat Aug 27, 2021  1429 DG Shoulder Right Portable [AA]    Clinical Course User Index [AA] Marita Kansas, PA-C                           Medical Decision Making Amount and/or Complexity of Data Reviewed Radiology: ordered.  Risk Prescription drug management.   Medical Decision Making / ED Course   This patient presents to the ED for concern of right shoulder pain, this involves an extensive number of treatment options, and is a complaint that carries with it a high risk of complications and morbidity.  The differential diagnosis includes joint fracture, shoulder dislocation  MDM: 56 year old male with past medical history significant for shoulder dislocation presents today for right shoulder dislocation.  On exam patient does have deformity to the right shoulder.  Neurovascularly intact.  Will obtain x-ray, provide pain medication.  X-ray demonstrates anterior dislocation. Shoulder reduction performed.  Dr. Rhunette Croft present for procedural sedation.  Shoulder successfully reduced.  Postreduction x-ray ordered. Shoulder immobilizer placed immediately following procedure.  Post reduction x-ray demonstrates successful shoulder reduction.   Effects of sedation have worn off.  Moderate conscious sedation patient instruction provided to patient at discharge.  Patient without pain on reevaluation.  Patient is appropriate for discharge.   Lab Tests: -I ordered, reviewed, and interpreted labs.   The pertinent results include:   Labs  Reviewed - No data to display    EKG  EKG Interpretation  Date/Time:    Ventricular Rate:    PR Interval:    QRS Duration:   QT Interval:    QTC Calculation:   R Axis:     Text Interpretation:           Imaging Studies ordered: I ordered imaging studies including right shoulder x ray I independently visualized and interpreted imaging. I agree with the radiologist interpretation   Medicines ordered and prescription drug management: Meds ordered this encounter  Medications  . fentaNYL (SUBLIMAZE) injection 50 mcg  . ondansetron (ZOFRAN) injection 4 mg    -I have reviewed the patients home medicines and have made adjustments as needed  Critical interventions Shoulder reduction  Cardiac Monitoring: The patient was maintained on a cardiac monitor.  I personally viewed and interpreted the cardiac monitored which showed an underlying rhythm of: NSR  Reevaluation: After the interventions noted above, I reevaluated the patient and found that they have :resolved  Co morbidities that complicate the patient evaluation . Past Medical History:  Diagnosis Date  . Alcohol abuse   .  Anxiety   . Depression   . Hypertension       Dispostion: Patient is appropriate for discharge.  Discharged in stable condition.  Return precautions provided.  Patient has a sports med provider that he follows with.  We will give him on-call orthopedist information in case he is unable to follow-up with his primary orthopedist.     Final Clinical Impression(s) / ED Diagnoses Final diagnoses:  Anterior dislocation of right shoulder, initial encounter    Rx / DC Orders ED Discharge Orders     None         Marita Kansas, PA-C 08/27/21 1431    Derwood Kaplan, MD 08/29/21 1225

## 2021-09-05 DIAGNOSIS — F1020 Alcohol dependence, uncomplicated: Secondary | ICD-10-CM | POA: Diagnosis not present

## 2021-10-04 ENCOUNTER — Emergency Department (HOSPITAL_COMMUNITY): Payer: BC Managed Care – PPO

## 2021-10-04 ENCOUNTER — Encounter (HOSPITAL_COMMUNITY): Payer: Self-pay

## 2021-10-04 ENCOUNTER — Emergency Department (HOSPITAL_COMMUNITY)
Admission: EM | Admit: 2021-10-04 | Discharge: 2021-10-04 | Disposition: A | Discharge status: Home / Self Care | Payer: BC Managed Care – PPO | Attending: Emergency Medicine | Admitting: Emergency Medicine

## 2021-10-04 DIAGNOSIS — W208XXA Other cause of strike by thrown, projected or falling object, initial encounter: Secondary | ICD-10-CM | POA: Insufficient documentation

## 2021-10-04 DIAGNOSIS — S4991XA Unspecified injury of right shoulder and upper arm, initial encounter: Secondary | ICD-10-CM | POA: Diagnosis not present

## 2021-10-04 DIAGNOSIS — S43004A Unspecified dislocation of right shoulder joint, initial encounter: Secondary | ICD-10-CM | POA: Insufficient documentation

## 2021-10-04 DIAGNOSIS — S43014A Anterior dislocation of right humerus, initial encounter: Secondary | ICD-10-CM | POA: Diagnosis not present

## 2021-10-04 DIAGNOSIS — M19011 Primary osteoarthritis, right shoulder: Secondary | ICD-10-CM | POA: Diagnosis not present

## 2021-10-04 DIAGNOSIS — Y990 Civilian activity done for income or pay: Secondary | ICD-10-CM | POA: Diagnosis not present

## 2021-10-04 DIAGNOSIS — S43011A Anterior subluxation of right humerus, initial encounter: Secondary | ICD-10-CM | POA: Diagnosis not present

## 2021-10-04 MED ORDER — ETOMIDATE 2 MG/ML IV SOLN
8.0000 mg | Freq: Once | INTRAVENOUS | Status: DC
  Filled 2021-10-04: qty 10

## 2021-10-04 MED ORDER — OXYCODONE-ACETAMINOPHEN 5-325 MG PO TABS
1.0000 | ORAL_TABLET | Freq: Once | ORAL | Status: AC
  Administered 2021-10-04: 1 via ORAL
  Filled 2021-10-04: qty 1

## 2021-10-04 MED ORDER — ETOMIDATE 2 MG/ML IV SOLN
INTRAVENOUS | Status: AC | PRN
  Administered 2021-10-04 (×2): 6 mg via INTRAVENOUS

## 2021-10-04 MED ORDER — FENTANYL CITRATE PF 50 MCG/ML IJ SOSY
50.0000 ug | PREFILLED_SYRINGE | Freq: Once | INTRAMUSCULAR | Status: DC
  Filled 2021-10-04: qty 1

## 2021-10-04 MED ORDER — FENTANYL CITRATE (PF) 100 MCG/2ML IJ SOLN
INTRAMUSCULAR | Status: AC | PRN
  Administered 2021-10-04: 50 ug via INTRAVENOUS

## 2021-10-04 NOTE — ED Notes (Signed)
Consent signed. IV inserted.

## 2021-10-04 NOTE — ED Triage Notes (Signed)
Pt arrived via POV, c/o right shoulder pain after trying to catch pallet falling at work, possible dislocation, hx of same.

## 2021-10-04 NOTE — ED Provider Triage Note (Signed)
Emergency Medicine Provider Triage Evaluation Note  Alex Logan , a 56 y.o. male  was evaluated in triage.  Pt complains of shoulder dislocation.  History of similar.  He was post to have this surgically fixed has not yet.  He went to reach for a cup that was falling when he felt his shoulder dislocate.  No numbness or weakness.  Feels similar to prior presentations.  Review of Systems  Positive: Right shoulder dislocation Negative:   Physical Exam  BP (!) 148/112 (BP Location: Left Arm)   Pulse 78   Temp 98 F (36.7 C) (Oral)   Resp 18   SpO2 98%  Gen:   Awake, no distress   Resp:  Normal effort  MSK:   Deformity to right shoulder Other:   Medical Decision Making  Medically screening exam initiated at 1:36 PM.  Appropriate orders placed.  Alex Logan was informed that the remainder of the evaluation will be completed by another provider, this initial triage assessment does not replace that evaluation, and the importance of remaining in the ED until their evaluation is complete.  Possible shoulder dislcoation   Adisson Deak A, PA-C 10/04/21 1337

## 2021-10-04 NOTE — Discharge Instructions (Addendum)
Keep your arm in the sling.  Follow-up with the orthopedic doctors in 2 weeks.  No heavy lifting or any activity above the shoulder level recommended until cleared by orthopedist.

## 2021-10-04 NOTE — ED Notes (Signed)
Room set up with ambu bag, suction, co2 monitor, crash cart.  Patient placed on monitor and pre-rhythm strip performed.

## 2021-10-04 NOTE — ED Provider Notes (Signed)
[facility] DEPT Provider Note   CSN: [id number] Arrival date & time: 10/04/21  1314     History  Chief Complaint  Patient presents with   Shoulder Injury    Alex Logan is a 56 y.o. male.  HPI    56 year old male comes in with chief complaint of shoulder injury. Patient indicates that he was at work and a crate was falling, he tried to intervene and ended up popping out his shoulder.  Patient works at [employer].  He has had history of recurrent dislocation in the same extremity, has not had surgical evaluation yet.  Denies any numbness or tingling.  N.p.o. since 1 PM.  Home Medications Prior to Admission medications   Medication Sig Start Date End Date Taking? Authorizing Provider  amLODipine (NORVASC) 10 MG tablet Take 1 tablet (10 mg total) by mouth daily. 08/13/20   Dwyane Dee, MD  escitalopram (LEXAPRO) 20 MG tablet Take 20 mg by mouth daily. 11/08/20   [provider]  ibuprofen (ADVIL) 200 MG tablet Take 600 mg by mouth every 6 (six) hours as needed for mild pain.    [provider]  lisinopril (ZESTRIL) 40 MG tablet Take 1 tablet (40 mg total) by mouth daily. 08/14/20   Dwyane Dee, MD  Multiple Vitamin (MULTIVITAMIN WITH MINERALS) TABS tablet Take 1 tablet by mouth daily. 01/17/20   Dwyane Dee, MD  naltrexone (DEPADE) 50 MG tablet Take 50 mg by mouth daily. 10/18/20   [provider]  pantoprazole (PROTONIX) 40 MG tablet Take 40 mg by mouth daily. 08/04/20   [provider]  propranolol ER (INDERAL LA) 80 MG 24 hr capsule Take 80 mg by mouth daily. 10/18/20   [provider]      Allergies    Patient has no known allergies.    Review of Systems   Review of Systems  Physical Exam Updated Vital Signs BP (!) 139/108   Pulse 80   Temp 98 F (36.7 C)   Resp 16   SpO2 99%  Physical Exam Vitals and nursing note reviewed.  Constitutional:      Appearance: He is well-developed.   HENT:     Head: Atraumatic.  Cardiovascular:     Rate and Rhythm: Normal rate.  Pulmonary:     Effort: Pulmonary effort is normal.  Musculoskeletal:        General: Swelling and deformity present.     Cervical back: Neck supple.  Skin:    General: Skin is warm.  Neurological:     Mental Status: He is alert and oriented to person, place, and time.     ED Results / Procedures / Treatments   Labs (all labs ordered are listed, but only abnormal results are displayed) Labs Reviewed - No data to display  EKG None  Radiology DG Shoulder Right  Result Date: 10/04/2021 CLINICAL DATA:  Possible dislocation EXAM: RIGHT SHOULDER - 2+ VIEW COMPARISON:  Earlier today FINDINGS: Interval reduction of the previously seen dislocated right shoulder. Normal alignment. No fracture. Degenerative changes in the right AC joint. IMPRESSION: Interval reduction.  No fracture or dislocation currently. Electronically Signed   By: Rolm Baptise M.D.   On: 10/04/2021 17:08   DG Shoulder Right  Result Date: 10/04/2021 CLINICAL DATA:  No fracture lines are seen EXAM: RIGHT SHOULDER - 2+ VIEW COMPARISON:  Glenoid. FINDINGS: There is anterior subcoracoid dislocation of humeral head in relation to glenoid. No fracture is seen. IMPRESSION: Anterior dislocation  of right shoulder. Electronically Signed   By: Elmer Picker M.D.   On: 10/04/2021 14:07    Procedures .Sedation  Date/Time: 10/04/2021 4:25 PM  Performed by: Varney Biles, MD Authorized by: Varney Biles, MD   Consent:    Consent obtained:  Written   Consent given by:  Patient   Risks discussed:  Prolonged hypoxia resulting in organ damage, prolonged sedation necessitating reversal, allergic reaction, dysrhythmia, inadequate sedation, respiratory compromise necessitating ventilatory assistance and intubation, nausea and vomiting   Alternatives discussed:  Analgesia without sedation Universal protocol:    Procedure explained and questions  answered to patient or proxy's satisfaction: yes     Site/side marked: yes     Immediately prior to procedure, a time out was called: yes     Patient identity confirmed:  Arm band Indications:    Procedure performed:  Dislocation reduction   Procedure necessitating sedation performed by:  Physician performing sedation Pre-sedation assessment:    Time since last food or drink:  3 hours   ASA classification: class 2 - patient with mild systemic disease     Mouth opening:  3 or more finger widths   Mallampati score:  II - soft palate, uvula, fauces visible   Neck mobility: normal     Pre-sedation assessments completed and reviewed: airway patency, cardiovascular function, hydration status, mental status, nausea/vomiting, pain level, respiratory function and temperature     Pre-sedation assessment completed:  10/04/2021 4:20 PM Immediate pre-procedure details:    Reassessment: Patient reassessed immediately prior to procedure     Reviewed: vital signs and NPO status     Verified: bag valve mask available, emergency equipment available, intubation equipment available, IV patency confirmed, oxygen available and suction available   Procedure details (see MAR for exact dosages):    Preoxygenation:  Nasal cannula   Sedation:  Etomidate   Intended level of sedation: deep   Analgesia:  Fentanyl   Intra-procedure monitoring:  Blood pressure monitoring, continuous capnometry, frequent LOC assessments, cardiac monitor, continuous pulse oximetry and frequent vital sign checks   Intra-procedure events: none     Total Provider sedation time (minutes):  18 Post-procedure details:    Post-sedation assessment completed:  10/04/2021 5:00 PM   Attendance: Constant attendance by certified staff until patient recovered     Recovery: Patient returned to pre-procedure baseline     Post-sedation assessments completed and reviewed: airway patency, cardiovascular function, mental status, nausea/vomiting, pain level  and respiratory function     Patient is stable for discharge or admission: yes     Procedure completion:  Tolerated well, no immediate complications Reduction of dislocation  Date/Time: 10/04/2021 6:28 PM  Performed by: Varney Biles, MD Authorized by: Varney Biles, MD  Consent: Written consent obtained. Risks and benefits: risks, benefits and alternatives were discussed Consent given by: patient Patient understanding: patient states understanding of the procedure being performed Imaging studies: imaging studies not available Required items: required blood products, implants, devices, and special equipment available Patient identity confirmed: arm band Time out: Immediately prior to procedure a "time out" was called to verify the correct patient, procedure, equipment, support staff and site/side marked as required. Preparation: Patient was prepped and draped in the usual sterile fashion. Local anesthesia used: no  Anesthesia: Local anesthesia used: no  Sedation: Patient sedated: yes  Patient tolerance: patient tolerated the procedure well with no immediate complications       Medications Ordered in ED Medications  etomidate (AMIDATE) injection 8 mg (8 mg Intravenous  Not Given 10/04/21 1659)  fentaNYL (SUBLIMAZE) injection 50 mcg (50 mcg Intravenous Not Given 10/04/21 1659)  oxyCODONE-acetaminophen (PERCOCET/ROXICET) 5-325 MG per tablet 1 tablet (1 tablet Oral Given 10/04/21 1345)  etomidate (AMIDATE) injection (6 mg Intravenous Given 10/04/21 1644)  fentaNYL (SUBLIMAZE) injection (50 mcg Intravenous Given 10/04/21 1639)    ED Course/ Medical Decision Making/ A&P                           Medical Decision Making Amount and/or Complexity of Data Reviewed Radiology: ordered.  Risk Prescription drug management.   This patient presents to the ED with chief complaint(s) of shoulder injury with pertinent past medical history of previous shoulder dislocation.The complaint  involves an extensive differential diagnosis and also carries with it a high risk of complications and morbidity.    The differential diagnosis includes shoulder dislocation, shoulder fracture.  X-ray of the shoulder interpreted independently, clear evidence of anterior shoulder dislocation.  Patient is neurovascularly intact.  Plan is to reduce the shoulder.  Additional history obtained: Records reviewed  previous ED visits for the same complaint.   Independent visualization and interpretation of imaging: - I independently visualized the following imaging with scope of interpretation limited to determining acute life threatening conditions related to emergency care: X-ray of the shoulder, which revealed dislocation of the right shoulder. I also independently interpreted the x-ray of the shoulder post reduction, satisfactory reduction appreciated.  Treatment and Reassessment: Patient neurovascularly intact postprocedure.  Placed in a sling.  Patient is AOx3.  Discharged after he demonstrated good capacity.   Final Clinical Impression(s) / ED Diagnoses Final diagnoses:  Dislocation of right shoulder joint, initial encounter    Rx / DC Orders ED Discharge Orders     None         Varney Biles, MD 10/04/21 1830

## 2021-10-06 DIAGNOSIS — F331 Major depressive disorder, recurrent, moderate: Secondary | ICD-10-CM | POA: Diagnosis not present

## 2021-10-06 DIAGNOSIS — Z23 Encounter for immunization: Secondary | ICD-10-CM | POA: Diagnosis not present

## 2021-10-06 DIAGNOSIS — F1011 Alcohol abuse, in remission: Secondary | ICD-10-CM | POA: Diagnosis not present

## 2021-10-06 DIAGNOSIS — I10 Essential (primary) hypertension: Secondary | ICD-10-CM | POA: Diagnosis not present

## 2021-11-09 DIAGNOSIS — M25311 Other instability, right shoulder: Secondary | ICD-10-CM | POA: Diagnosis not present

## 2021-11-09 DIAGNOSIS — S43431A Superior glenoid labrum lesion of right shoulder, initial encounter: Secondary | ICD-10-CM | POA: Diagnosis not present

## 2021-11-24 DIAGNOSIS — E291 Testicular hypofunction: Secondary | ICD-10-CM | POA: Diagnosis not present

## 2021-11-24 DIAGNOSIS — N5201 Erectile dysfunction due to arterial insufficiency: Secondary | ICD-10-CM | POA: Diagnosis not present

## 2021-12-06 DIAGNOSIS — E291 Testicular hypofunction: Secondary | ICD-10-CM | POA: Diagnosis not present

## 2021-12-13 DIAGNOSIS — Z125 Encounter for screening for malignant neoplasm of prostate: Secondary | ICD-10-CM | POA: Diagnosis not present

## 2021-12-13 DIAGNOSIS — E291 Testicular hypofunction: Secondary | ICD-10-CM | POA: Diagnosis not present

## 2022-02-17 ENCOUNTER — Inpatient Hospital Stay (HOSPITAL_BASED_OUTPATIENT_CLINIC_OR_DEPARTMENT_OTHER)
Admission: EM | Admit: 2022-02-17 | Discharge: 2022-02-19 | DRG: 683 | Disposition: A | Discharge status: Home / Self Care | Payer: BC Managed Care – PPO | Attending: Internal Medicine | Admitting: Internal Medicine

## 2022-02-17 ENCOUNTER — Other Ambulatory Visit: Payer: Self-pay

## 2022-02-17 ENCOUNTER — Encounter (HOSPITAL_BASED_OUTPATIENT_CLINIC_OR_DEPARTMENT_OTHER): Payer: Self-pay | Admitting: Emergency Medicine

## 2022-02-17 DIAGNOSIS — E875 Hyperkalemia: Secondary | ICD-10-CM | POA: Diagnosis present

## 2022-02-17 DIAGNOSIS — E872 Acidosis, unspecified: Secondary | ICD-10-CM | POA: Diagnosis present

## 2022-02-17 DIAGNOSIS — R1115 Cyclical vomiting syndrome unrelated to migraine: Secondary | ICD-10-CM | POA: Diagnosis present

## 2022-02-17 DIAGNOSIS — Z79899 Other long term (current) drug therapy: Secondary | ICD-10-CM

## 2022-02-17 DIAGNOSIS — E039 Hypothyroidism, unspecified: Secondary | ICD-10-CM | POA: Diagnosis not present

## 2022-02-17 DIAGNOSIS — Z811 Family history of alcohol abuse and dependence: Secondary | ICD-10-CM

## 2022-02-17 DIAGNOSIS — F419 Anxiety disorder, unspecified: Secondary | ICD-10-CM | POA: Diagnosis present

## 2022-02-17 DIAGNOSIS — F10130 Alcohol abuse with withdrawal, uncomplicated: Secondary | ICD-10-CM | POA: Diagnosis present

## 2022-02-17 DIAGNOSIS — I10 Essential (primary) hypertension: Secondary | ICD-10-CM | POA: Diagnosis present

## 2022-02-17 DIAGNOSIS — F32A Depression, unspecified: Secondary | ICD-10-CM | POA: Diagnosis not present

## 2022-02-17 DIAGNOSIS — R066 Hiccough: Secondary | ICD-10-CM | POA: Diagnosis not present

## 2022-02-17 DIAGNOSIS — Z825 Family history of asthma and other chronic lower respiratory diseases: Secondary | ICD-10-CM

## 2022-02-17 DIAGNOSIS — F0390 Unspecified dementia without behavioral disturbance: Secondary | ICD-10-CM | POA: Diagnosis not present

## 2022-02-17 DIAGNOSIS — Z818 Family history of other mental and behavioral disorders: Secondary | ICD-10-CM

## 2022-02-17 DIAGNOSIS — F10239 Alcohol dependence with withdrawal, unspecified: Secondary | ICD-10-CM | POA: Diagnosis not present

## 2022-02-17 DIAGNOSIS — I959 Hypotension, unspecified: Secondary | ICD-10-CM | POA: Diagnosis not present

## 2022-02-17 DIAGNOSIS — R000 Tachycardia, unspecified: Secondary | ICD-10-CM | POA: Diagnosis not present

## 2022-02-17 DIAGNOSIS — E871 Hypo-osmolality and hyponatremia: Secondary | ICD-10-CM | POA: Diagnosis not present

## 2022-02-17 DIAGNOSIS — F10930 Alcohol use, unspecified with withdrawal, uncomplicated: Secondary | ICD-10-CM

## 2022-02-17 DIAGNOSIS — E119 Type 2 diabetes mellitus without complications: Secondary | ICD-10-CM | POA: Diagnosis not present

## 2022-02-17 DIAGNOSIS — K219 Gastro-esophageal reflux disease without esophagitis: Secondary | ICD-10-CM | POA: Diagnosis not present

## 2022-02-17 DIAGNOSIS — F10939 Alcohol use, unspecified with withdrawal, unspecified: Secondary | ICD-10-CM | POA: Diagnosis not present

## 2022-02-17 DIAGNOSIS — J449 Chronic obstructive pulmonary disease, unspecified: Secondary | ICD-10-CM | POA: Diagnosis not present

## 2022-02-17 DIAGNOSIS — N179 Acute kidney failure, unspecified: Secondary | ICD-10-CM | POA: Diagnosis not present

## 2022-02-17 LAB — CBC
HCT: 44 % (ref 39.0–52.0)
Hemoglobin: 15.7 g/dL (ref 13.0–17.0)
MCH: 31.1 pg (ref 26.0–34.0)
MCHC: 35.7 g/dL (ref 30.0–36.0)
MCV: 87.1 fL (ref 80.0–100.0)
Platelets: 334 10*3/uL (ref 150–400)
RBC: 5.05 MIL/uL (ref 4.22–5.81)
RDW: 13.1 % (ref 11.5–15.5)
WBC: 18.9 10*3/uL — ABNORMAL HIGH (ref 4.0–10.5)
nRBC: 0 % (ref 0.0–0.2)

## 2022-02-17 LAB — BASIC METABOLIC PANEL
Anion gap: 19 — ABNORMAL HIGH (ref 5–15)
Anion gap: 18 — ABNORMAL HIGH (ref 5–15)
Anion gap: 12 (ref 5–15)
BUN: 57 mg/dL — ABNORMAL HIGH (ref 6–20)
BUN: 50 mg/dL — ABNORMAL HIGH (ref 6–20)
BUN: 46 mg/dL — ABNORMAL HIGH (ref 6–20)
CO2: 28 mmol/L (ref 22–32)
CO2: 24 mmol/L (ref 22–32)
CO2: 29 mmol/L (ref 22–32)
Calcium: 8.5 mg/dL — ABNORMAL LOW (ref 8.9–10.3)
Calcium: 8.2 mg/dL — ABNORMAL LOW (ref 8.9–10.3)
Calcium: 8.4 mg/dL — ABNORMAL LOW (ref 8.9–10.3)
Chloride: 80 mmol/L — ABNORMAL LOW (ref 98–111)
Chloride: 84 mmol/L — ABNORMAL LOW (ref 98–111)
Chloride: 90 mmol/L — ABNORMAL LOW (ref 98–111)
Creatinine, Ser: 2.28 mg/dL — ABNORMAL HIGH (ref 0.61–1.24)
Creatinine, Ser: 1.95 mg/dL — ABNORMAL HIGH (ref 0.61–1.24)
Creatinine, Ser: 1.56 mg/dL — ABNORMAL HIGH (ref 0.61–1.24)
GFR, Estimated: 33 mL/min — ABNORMAL LOW (ref >60)
GFR, Estimated: 40 mL/min — ABNORMAL LOW (ref >60)
GFR, Estimated: 52 mL/min — ABNORMAL LOW (ref >60)
Glucose, Bld: 124 mg/dL — ABNORMAL HIGH (ref 70–99)
Glucose, Bld: 192 mg/dL — ABNORMAL HIGH (ref 70–99)
Glucose, Bld: 188 mg/dL — ABNORMAL HIGH (ref 70–99)
Potassium: 5 mmol/L (ref 3.5–5.1)
Potassium: 5.2 mmol/L — ABNORMAL HIGH (ref 3.5–5.1)
Potassium: 3.7 mmol/L (ref 3.5–5.1)
Sodium: 127 mmol/L — ABNORMAL LOW (ref 135–145)
Sodium: 126 mmol/L — ABNORMAL LOW (ref 135–145)
Sodium: 131 mmol/L — ABNORMAL LOW (ref 135–145)

## 2022-02-17 LAB — COMPREHENSIVE METABOLIC PANEL
ALT: 23 U/L (ref 0–44)
AST: 35 U/L (ref 15–41)
Albumin: 5.1 g/dL — ABNORMAL HIGH (ref 3.5–5.0)
Alkaline Phosphatase: 64 U/L (ref 38–126)
Anion gap: 38 — ABNORMAL HIGH (ref 5–15)
BUN: 59 mg/dL — ABNORMAL HIGH (ref 6–20)
CO2: 17 mmol/L — ABNORMAL LOW (ref 22–32)
Calcium: 9.3 mg/dL (ref 8.9–10.3)
Chloride: 70 mmol/L — ABNORMAL LOW (ref 98–111)
Creatinine, Ser: 3.32 mg/dL — ABNORMAL HIGH (ref 0.61–1.24)
GFR, Estimated: 21 mL/min — ABNORMAL LOW (ref >60)
Glucose, Bld: 178 mg/dL — ABNORMAL HIGH (ref 70–99)
Potassium: 5.2 mmol/L — ABNORMAL HIGH (ref 3.5–5.1)
Sodium: 125 mmol/L — ABNORMAL LOW (ref 135–145)
Total Bilirubin: 1.7 mg/dL — ABNORMAL HIGH (ref 0.3–1.2)
Total Protein: 8.2 g/dL — ABNORMAL HIGH (ref 6.5–8.1)

## 2022-02-17 LAB — URINALYSIS, ROUTINE W REFLEX MICROSCOPIC
Bacteria, UA: NONE SEEN
Bilirubin Urine: NEGATIVE
Glucose, UA: NEGATIVE mg/dL
Hgb urine dipstick: NEGATIVE
Leukocytes,Ua: NEGATIVE
Nitrite: NEGATIVE
Protein, ur: 30 mg/dL — AB
Specific Gravity, Urine: 1.022 (ref 1.005–1.030)
pH: 5 (ref 5.0–8.0)

## 2022-02-17 LAB — RAPID URINE DRUG SCREEN, HOSP PERFORMED
Amphetamines: NOT DETECTED
Barbiturates: NOT DETECTED
Benzodiazepines: NOT DETECTED
Cocaine: NOT DETECTED
Opiates: NOT DETECTED
Tetrahydrocannabinol: NOT DETECTED

## 2022-02-17 LAB — PHOSPHORUS: Phosphorus: 4.7 mg/dL — ABNORMAL HIGH (ref 2.5–4.6)

## 2022-02-17 LAB — TSH: TSH: 1.355 u[IU]/mL (ref 0.350–4.500)

## 2022-02-17 LAB — LIPASE, BLOOD: Lipase: 64 U/L — ABNORMAL HIGH (ref 11–51)

## 2022-02-17 LAB — SODIUM, URINE, RANDOM: Sodium, Ur: 46 mmol/L

## 2022-02-17 LAB — OSMOLALITY: Osmolality: 292 mOsm/kg (ref 275–295)

## 2022-02-17 LAB — ETHANOL: Alcohol, Ethyl (B): <10 mg/dL (ref <10)

## 2022-02-17 LAB — MAGNESIUM: Magnesium: 2.1 mg/dL (ref 1.7–2.4)

## 2022-02-17 LAB — OSMOLALITY, URINE: Osmolality, Ur: 602 mOsm/kg (ref 300–900)

## 2022-02-17 MED ORDER — THIAMINE MONONITRATE 100 MG PO TABS
100.0000 mg | ORAL_TABLET | Freq: Every day | ORAL | Status: DC
  Administered 2022-02-17: 100 mg via ORAL
  Filled 2022-02-17: qty 1

## 2022-02-17 MED ORDER — THIAMINE HCL 100 MG/ML IJ SOLN: 100.0000 mg | Freq: Every day | INTRAMUSCULAR | Status: DC

## 2022-02-17 MED ORDER — GABAPENTIN 300 MG PO CAPS
300.0000 mg | ORAL_CAPSULE | Freq: Three times a day (TID) | ORAL | Status: DC
  Administered 2022-02-17 – 2022-02-19 (×8): 300 mg via ORAL
  Filled 2022-02-17 (×8): qty 1

## 2022-02-17 MED ORDER — LORAZEPAM 2 MG/ML IJ SOLN: 1.0000 mg | INTRAMUSCULAR | Status: DC | PRN

## 2022-02-17 MED ORDER — LORAZEPAM 2 MG/ML IJ SOLN: 0.0000 mg | Freq: Two times a day (BID) | INTRAMUSCULAR | Status: DC

## 2022-02-17 MED ORDER — LORAZEPAM 1 MG PO TABS
1.0000 mg | ORAL_TABLET | ORAL | Status: DC | PRN
  Filled 2022-02-17: qty 2

## 2022-02-17 MED ORDER — SODIUM CHLORIDE 0.9 % IV BOLUS: 1000.0000 mL | Freq: Once | INTRAVENOUS | Status: DC

## 2022-02-17 MED ORDER — METOPROLOL TARTRATE 5 MG/5ML IV SOLN: 5.0000 mg | INTRAVENOUS | Status: DC | PRN

## 2022-02-17 MED ORDER — PANTOPRAZOLE SODIUM 40 MG IV SOLR
40.0000 mg | Freq: Once | INTRAVENOUS | Status: AC
  Administered 2022-02-17: 40 mg via INTRAVENOUS
  Filled 2022-02-17: qty 10

## 2022-02-17 MED ORDER — LACTATED RINGERS IV BOLUS
1000.0000 mL | Freq: Once | INTRAVENOUS | Status: AC
  Administered 2022-02-17: 1000 mL via INTRAVENOUS

## 2022-02-17 MED ORDER — DEXTROSE-NACL 5-0.9 % IV SOLN: INTRAVENOUS | Status: DC

## 2022-02-17 MED ORDER — THIAMINE MONONITRATE 100 MG PO TABS: 100.0000 mg | ORAL_TABLET | Freq: Every day | ORAL | Status: DC

## 2022-02-17 MED ORDER — FOLIC ACID 1 MG PO TABS
1.0000 mg | ORAL_TABLET | Freq: Every day | ORAL | Status: DC
  Administered 2022-02-17: 1 mg via ORAL
  Filled 2022-02-17: qty 1

## 2022-02-17 MED ORDER — CHLORDIAZEPOXIDE HCL 5 MG PO CAPS
25.0000 mg | ORAL_CAPSULE | Freq: Three times a day (TID) | ORAL | Status: DC
  Administered 2022-02-17 – 2022-02-19 (×8): 25 mg via ORAL
  Filled 2022-02-17 (×8): qty 5

## 2022-02-17 MED ORDER — AMLODIPINE BESYLATE 5 MG PO TABS
10.0000 mg | ORAL_TABLET | Freq: Once | ORAL | Status: AC
  Administered 2022-02-17: 10 mg via ORAL
  Filled 2022-02-17: qty 2

## 2022-02-17 MED ORDER — DEXTROSE-NACL 5-0.45 % IV SOLN: INTRAVENOUS | Status: DC

## 2022-02-17 MED ORDER — FOLIC ACID 1 MG PO TABS: 1.0000 mg | ORAL_TABLET | Freq: Every day | ORAL | Status: DC

## 2022-02-17 MED ORDER — LORAZEPAM 2 MG/ML IJ SOLN
1.0000 mg | Freq: Once | INTRAMUSCULAR | Status: AC
  Administered 2022-02-17: 1 mg via INTRAVENOUS
  Filled 2022-02-17: qty 1

## 2022-02-17 MED ORDER — LORAZEPAM 1 MG PO TABS: 1.0000 mg | ORAL_TABLET | ORAL | Status: DC | PRN

## 2022-02-17 MED ORDER — PANTOPRAZOLE SODIUM 40 MG PO TBEC: 40.0000 mg | DELAYED_RELEASE_TABLET | Freq: Every day | ORAL | Status: DC

## 2022-02-17 MED ORDER — LORAZEPAM 2 MG/ML IJ SOLN: 0.0000 mg | Freq: Four times a day (QID) | INTRAMUSCULAR | Status: DC

## 2022-02-17 MED ORDER — ALUM & MAG HYDROXIDE-SIMETH 200-200-20 MG/5ML PO SUSP
30.0000 mL | Freq: Four times a day (QID) | ORAL | Status: DC | PRN
  Administered 2022-02-19 (×2): 30 mL via ORAL
  Filled 2022-02-17 (×2): qty 30

## 2022-02-17 MED ORDER — HYDRALAZINE HCL 25 MG PO TABS
25.0000 mg | ORAL_TABLET | Freq: Three times a day (TID) | ORAL | Status: DC
  Administered 2022-02-17 – 2022-02-19 (×7): 25 mg via ORAL
  Filled 2022-02-17 (×7): qty 1

## 2022-02-17 MED ORDER — LORAZEPAM 1 MG PO TABS
1.0000 mg | ORAL_TABLET | ORAL | Status: DC | PRN
  Administered 2022-02-17 (×2): 2 mg via ORAL
  Filled 2022-02-17: qty 2

## 2022-02-17 MED ORDER — FOLIC ACID 1 MG PO TABS
1.0000 mg | ORAL_TABLET | Freq: Every day | ORAL | Status: DC
  Administered 2022-02-18 – 2022-02-19 (×2): 1 mg via ORAL
  Filled 2022-02-17 (×3): qty 1

## 2022-02-17 MED ORDER — PROPRANOLOL HCL 1 MG/ML IV SOLN
1.0000 mg | Freq: Once | INTRAVENOUS | Status: AC
  Administered 2022-02-17: 1 mg via INTRAVENOUS
  Filled 2022-02-17: qty 1

## 2022-02-17 MED ORDER — METOCLOPRAMIDE HCL 5 MG/ML IJ SOLN
10.0000 mg | Freq: Once | INTRAMUSCULAR | Status: AC
  Administered 2022-02-17: 10 mg via INTRAVENOUS
  Filled 2022-02-17: qty 2

## 2022-02-17 MED ORDER — THIAMINE MONONITRATE 100 MG PO TABS
100.0000 mg | ORAL_TABLET | Freq: Every day | ORAL | Status: DC
  Administered 2022-02-18 – 2022-02-19 (×2): 100 mg via ORAL
  Filled 2022-02-17 (×3): qty 1

## 2022-02-17 MED ORDER — LORAZEPAM 2 MG/ML IJ SOLN
0.0000 mg | Freq: Four times a day (QID) | INTRAMUSCULAR | Status: AC
  Administered 2022-02-17: 2 mg via INTRAVENOUS
  Filled 2022-02-17 (×2): qty 1

## 2022-02-17 MED ORDER — ADULT MULTIVITAMIN W/MINERALS CH
1.0000 | ORAL_TABLET | Freq: Every day | ORAL | Status: DC
  Administered 2022-02-18 – 2022-02-19 (×2): 1 via ORAL
  Filled 2022-02-17 (×3): qty 1

## 2022-02-17 MED ORDER — PANTOPRAZOLE SODIUM 40 MG PO TBEC
40.0000 mg | DELAYED_RELEASE_TABLET | Freq: Every day | ORAL | Status: DC
  Administered 2022-02-17 – 2022-02-19 (×3): 40 mg via ORAL
  Filled 2022-02-17 (×3): qty 1

## 2022-02-17 MED ORDER — BACLOFEN 10 MG PO TABS
10.0000 mg | ORAL_TABLET | Freq: Three times a day (TID) | ORAL | Status: AC
  Administered 2022-02-17 (×3): 10 mg via ORAL
  Filled 2022-02-17 (×3): qty 1

## 2022-02-17 MED ORDER — PROPRANOLOL HCL 20 MG PO TABS
80.0000 mg | ORAL_TABLET | Freq: Every day | ORAL | Status: DC
  Administered 2022-02-17 – 2022-02-19 (×3): 80 mg via ORAL
  Filled 2022-02-17 (×3): qty 4

## 2022-02-17 MED ORDER — ADULT MULTIVITAMIN W/MINERALS CH
1.0000 | ORAL_TABLET | Freq: Every day | ORAL | Status: DC
  Administered 2022-02-17: 1 via ORAL
  Filled 2022-02-17: qty 1

## 2022-02-17 MED ORDER — SODIUM CHLORIDE 0.9 % IV SOLN: INTRAVENOUS | Status: DC

## 2022-02-17 MED ORDER — AMLODIPINE BESYLATE 10 MG PO TABS
10.0000 mg | ORAL_TABLET | Freq: Every day | ORAL | Status: DC
  Administered 2022-02-18 – 2022-02-19 (×2): 10 mg via ORAL
  Filled 2022-02-17 (×3): qty 1

## 2022-02-17 MED ORDER — HEPARIN SODIUM (PORCINE) 5000 UNIT/ML IJ SOLN
5000.0000 [IU] | Freq: Three times a day (TID) | INTRAMUSCULAR | Status: DC
  Administered 2022-02-17 – 2022-02-19 (×7): 5000 [IU] via SUBCUTANEOUS
  Filled 2022-02-17 (×7): qty 1

## 2022-02-17 MED ORDER — ADULT MULTIVITAMIN W/MINERALS CH: 1.0000 | ORAL_TABLET | Freq: Every day | ORAL | Status: DC

## 2022-02-17 MED ORDER — ESCITALOPRAM OXALATE 20 MG PO TABS
20.0000 mg | ORAL_TABLET | Freq: Every day | ORAL | Status: DC
  Administered 2022-02-17 – 2022-02-19 (×3): 20 mg via ORAL
  Filled 2022-02-17 (×3): qty 1

## 2022-02-17 MED ORDER — ONDANSETRON HCL 4 MG/2ML IJ SOLN: 4.0000 mg | Freq: Four times a day (QID) | INTRAMUSCULAR | Status: DC | PRN

## 2022-02-17 MED ORDER — CHLORDIAZEPOXIDE HCL 25 MG PO CAPS
50.0000 mg | ORAL_CAPSULE | Freq: Once | ORAL | Status: AC
  Administered 2022-02-17: 50 mg via ORAL
  Filled 2022-02-17: qty 2

## 2022-02-17 NOTE — ED Triage Notes (Signed)
Drinking for last few weeks. Drinking about a pint of fireball and box of wine a day . Wants to stop Last drink around noon N/v  Shaking in triage

## 2022-02-17 NOTE — Progress Notes (Signed)
This is a 57 year old male with known history of alcohol abuse.  Per reports he drinks a pint of fireball and a box of whiskey daily.  He additionally has hypertension, GERD, anxiety and depression.  He presented to the ER stating that he wants to stop drinking.  His last drink was around noon yesterday and he was shaking.  Labs done revealed a sodium of 125 (no prior history of hyponatremia), potassium 5.2 chloride 70, CO2 17 creatinine 3.32 [baseline creatinine normal] and anion gap of 38.  White blood count 18.9.  No report or sign of of infection. Patient's blood pressure was quite elevated as high as 187/161.   Patient admitted to progressive care.   -CIWA Protocol initiated, thiamine, vitamin B12 and multivitamin -NS hydration for electrolyte imbalance including hyperkalemia and AKI -Strict I's and O's for AKI -Home meds of Norvasc resumed, as needed Lopressor.  Lisinopril on hold with AKI -Protonix resumed -BMP every 6 hours x 3 with hyponatremia.  Urine and plasma osmolality, TSH, urine sodium.  Defer to a.m. team nephrology consult -Tachycardia.  On telemetry -High anion gap with metabolic acidosis.  Likely related to alcoholic ketoacidosis, starvation and AKI.  Will check ketone levels

## 2022-02-17 NOTE — ED Provider Notes (Signed)
[facility] Provider Note   CSN: [id number] Arrival date & time: 02/17/22  0111     History  Chief Complaint  Patient presents with   Alcohol Problem    Alex Logan is a 57 y.o. male.  57 year old male who presents ER today with nausea, vomiting and shakiness after withdrawing from alcohol.  Patient states he has a history of having problems with withdrawal from alcohol.  He started drinking again a couple weeks ago.  He states he drinks a couple boxes of boxed wine a day which equals about 4 bottles.  Prior to this he was drinking 1/5 a day and prior to that about a pint a day.  Has any drink since yesterday.  He feels shaky, nauseous and abdominal cramping.  A bit sweaty.  No other associated symptoms.  On review of the records he has been admitted in the past for alcohol withdrawal without complications.   Alcohol Problem       Home Medications Prior to Admission medications   Medication Sig Start Date End Date Taking? Authorizing Provider  amLODipine (NORVASC) 10 MG tablet Take 1 tablet (10 mg total) by mouth daily. 08/13/20   Dwyane Dee, MD  escitalopram (LEXAPRO) 20 MG tablet Take 20 mg by mouth daily. 11/08/20   [provider]  ibuprofen (ADVIL) 200 MG tablet Take 600 mg by mouth every 6 (six) hours as needed for mild pain.    [provider]  lisinopril (ZESTRIL) 40 MG tablet Take 1 tablet (40 mg total) by mouth daily. 08/14/20   Dwyane Dee, MD  Multiple Vitamin (MULTIVITAMIN WITH MINERALS) TABS tablet Take 1 tablet by mouth daily. 01/17/20   Dwyane Dee, MD  naltrexone (DEPADE) 50 MG tablet Take 50 mg by mouth daily. 10/18/20   [provider]  pantoprazole (PROTONIX) 40 MG tablet Take 40 mg by mouth daily. 08/04/20   [provider]  propranolol ER (INDERAL LA) 80 MG 24 hr capsule Take 80 mg by mouth daily. 10/18/20   [provider]      Allergies    Patient has no  known allergies.    Review of Systems   Review of Systems  Physical Exam Updated Vital Signs BP (!) 146/96   Pulse (!) 115   Temp 97.6 F (36.4 C) (Oral)   Resp 20   SpO2 94%  Physical Exam Vitals and nursing note reviewed.  Constitutional:      Appearance: He is well-developed.  HENT:     Head: Normocephalic and atraumatic.     Mouth/Throat:     Mouth: Mucous membranes are dry.  Eyes:     Pupils: Pupils are equal, round, and reactive to light.  Cardiovascular:     Rate and Rhythm: Tachycardia present.  Pulmonary:     Effort: Pulmonary effort is normal. No respiratory distress.  Abdominal:     General: There is no distension.     Tenderness: There is no abdominal tenderness.  Musculoskeletal:        General: Normal range of motion.     Cervical back: Normal range of motion.  Neurological:     Mental Status: He is alert.     Comments: tremulous     ED Results / Procedures / Treatments   Labs (all labs ordered are listed, but only abnormal results are displayed) Labs Reviewed  COMPREHENSIVE METABOLIC PANEL - Abnormal; Notable for the following components:      Result Value  Sodium 125 (*)    Potassium 5.2 (*)    Chloride 70 (*)    CO2 17 (*)    Glucose, Bld 178 (*)    BUN 59 (*)    Creatinine, Ser 3.32 (*)    Total Protein 8.2 (*)    Albumin 5.1 (*)    Total Bilirubin 1.7 (*)    GFR, Estimated 21 (*)    Anion gap 38 (*)    All other components within normal limits  CBC - Abnormal; Notable for the following components:   WBC 18.9 (*)    All other components within normal limits  LIPASE, BLOOD - Abnormal; Notable for the following components:   Lipase 64 (*)    All other components within normal limits  ETHANOL  RAPID URINE DRUG SCREEN, HOSP PERFORMED    EKG None  Radiology No results found.  Procedures .Critical Care  Performed by: Merrily Pew, MD Authorized by: Merrily Pew, MD   Critical care provider statement:    Critical care time  (minutes):  30   Critical care was necessary to treat or prevent imminent or life-threatening deterioration of the following conditions:  Metabolic crisis, dehydration and toxidrome   Critical care was time spent personally by me on the following activities:  Development of treatment plan with patient or surrogate, discussions with consultants, evaluation of patient's response to treatment, examination of patient, ordering and review of laboratory studies, ordering and review of radiographic studies, ordering and performing treatments and interventions, pulse oximetry, re-evaluation of patient's condition and review of old charts     Medications Ordered in ED Medications  lactated ringers bolus 1,000 mL (0 mLs Intravenous Stopped 02/17/22 0300)  LORazepam (ATIVAN) injection 1 mg (1 mg Intravenous Given 02/17/22 0205)  metoCLOPramide (REGLAN) injection 10 mg (10 mg Intravenous Given 02/17/22 0204)  pantoprazole (PROTONIX) injection 40 mg (40 mg Intravenous Given 02/17/22 0204)  LORazepam (ATIVAN) injection 1 mg (1 mg Intravenous Given 02/17/22 0320)  lactated ringers bolus 1,000 mL (0 mLs Intravenous Stopped 02/17/22 0425)  propranolol (INDERAL) injection 1 mg (1 mg Intravenous Given 02/17/22 0321)  amLODipine (NORVASC) tablet 10 mg (10 mg Oral Given 02/17/22 0320)  chlordiazePOXIDE (LIBRIUM) capsule 50 mg (50 mg Oral Given 02/17/22 [id number])    ED Course/ Medical Decision Making/ A&P                             Medical Decision Making Amount and/or Complexity of Data Reviewed Labs: ordered.  Risk Prescription drug management. Decision regarding hospitalization.   57 year old male that presents the ER today in acute alcohol withdrawal.  Initially fluids and labs were obtained and Ativan given.  Patient felt much better with Ativan Zofran and fluids however persistently tachycardic and also concern for alcoholic ketoacidosis on his labs.  Started D5 half-normal saline and Librium.  Discussed with hospitalist  for admission.  Patient stable and feeling better at this time.  Also has an AKI.  Final Clinical Impression(s) / ED Diagnoses Final diagnoses:  Tachycardia  Hyponatremia  Alcohol withdrawal syndrome without complication [facility])    Rx / DC Orders ED Discharge Orders     None         Marielys Trinidad, Corene Cornea, MD 02/17/22 [phone]

## 2022-02-17 NOTE — ED Notes (Signed)
Handoff report given to [facility] ?

## 2022-02-17 NOTE — H&P (Signed)
History and Physical    Patient: Alex Logan [id number] DOB: 02-Oct-1965 DOA: 02/17/2022 DOS: the patient was seen and examined on 02/17/2022 PCP: [facility]  Patient coming from:  [facility]  Chief Complaint:  Chief Complaint  Patient presents with   Alcohol Problem   HPI: Alex Logan is a 57 y.o. male who presents form [facility] due to alcohol withdrawal and AKI. Pt reports his last drink was 24 hrs ago. He usually drinks (3) 750 mL bottles of wine. However, recently he started including [employer]. He reports some vomiting which is what brought him to the hospital.  Vomiting sometimes had a little bit of blood mixed in. During triage the pt was apparently shaking.  Given that the pt has tachycardia, an AKI and persistent nausea and vomiting he will be admitted to the hospital for further management.   Review of Systems: As mentioned in the history of present illness. All other systems reviewed and are negative. Past Medical History:  Diagnosis Date   Alcohol abuse    Anxiety    Depression    Hypertension    Past Surgical History:  Procedure Laterality Date   NO PAST SURGERIES     Social History:  reports that he has never smoked. He has never used smokeless tobacco. He reports current alcohol use. He reports that he does not use drugs.  No Known Allergies  Family History  Problem Relation Age of Onset   Emphysema Mother    Bipolar disorder Father    Alcohol abuse Father    Mental illness Neg Hx     Prior to Admission medications   Medication Sig Start Date End Date Taking? Authorizing Provider  amLODipine (NORVASC) 10 MG tablet Take 1 tablet (10 mg total) by mouth daily. 08/13/20   Dwyane Dee, MD  escitalopram (LEXAPRO) 20 MG tablet Take 20 mg by mouth daily. 11/08/20   [provider]  ibuprofen (ADVIL) 200 MG tablet Take 600 mg by mouth every 6 (six) hours as needed for mild pain.    [provider]  lisinopril (ZESTRIL) 40  MG tablet Take 1 tablet (40 mg total) by mouth daily. 08/14/20   Dwyane Dee, MD  Multiple Vitamin (MULTIVITAMIN WITH MINERALS) TABS tablet Take 1 tablet by mouth daily. 01/17/20   Dwyane Dee, MD  naltrexone (DEPADE) 50 MG tablet Take 50 mg by mouth daily. 10/18/20   [provider]  pantoprazole (PROTONIX) 40 MG tablet Take 40 mg by mouth daily. 08/04/20   [provider]  propranolol ER (INDERAL LA) 80 MG 24 hr capsule Take 80 mg by mouth daily. 10/18/20   [provider]    Physical Exam: Vitals:   02/17/22 0708 02/17/22 0717 02/17/22 0815 02/17/22 0902  BP:  (!) 138/100  136/87  Pulse:  (!) 122  (!) 124  Resp:    20  Temp: 98.5 F (36.9 C)  98.7 F (37.1 C) (!) 97.5 F (36.4 C)  TempSrc: Oral  Oral Oral  SpO2:    98%   Physical Exam Constitutional:      Appearance: Normal appearance.  HENT:     Head: Normocephalic and atraumatic.     Mouth/Throat:     Mouth: Mucous membranes are moist.  Cardiovascular:     Rate and Rhythm: Tachycardia present.  Pulmonary:     Effort: Pulmonary effort is normal.  Abdominal:     General: Abdomen is flat.     Palpations: Abdomen is soft.  Musculoskeletal:        General: Normal range of motion.     Cervical back: Neck supple.  Skin:    General: Skin is warm.  Neurological:     Mental Status: He is alert and oriented to person, place, and time.  Psychiatric:        Mood and Affect: Mood normal.    Data Reviewed:  There are no new results to review at this time.  Assessment and Plan: Alcohol withdrawal  - Cardiac monitoring  - Ativan per CIWA  - Librium 25 mg PO tid  - IV 123456 0000000 cc/hr  - Folic acid 1 mg PO daily  - MVI PO daily  - Thiamine 100 mg PO daily  - Gabapentin 300 mg PO tid  - Lexapro 20 mg PO daily   Acute hyponatermia  - IV fluids as above   AKI - IV fluids as above   Increased anion gap metabolic acidosis  - IV fluids as above   Nausea and vomiting  - Maalox 30 mL PO q6  hr PRN   6. HTN  - Norvasc 10 mg PO daily  - Propranolol 80 mg PO daily  7. Hiccups - Baclofen 10 mg PO tid for 24 hr   8. GERD  - Protonix 40 mg PO daily   DVT prophylaxis: Heparin 5000 units sq q8hr    Advance Care Planning:   Code Status: Prior FULL   Severity of Illness: The appropriate patient status for this patient is INPATIENT. Inpatient status is judged to be reasonable and necessary in order to provide the required intensity of service to ensure the patient's safety. The patient's presenting symptoms, physical exam findings, and initial radiographic and laboratory data in the context of their chronic comorbidities is felt to place them at high risk for further clinical deterioration. Furthermore, it is not anticipated that the patient will be medically stable for discharge from the hospital within 2 midnights of admission.   * I certify that at the point of admission it is my clinical judgment that the patient will require inpatient hospital care spanning beyond 2 midnights from the point of admission due to high intensity of service, high risk for further deterioration and high frequency of surveillance required.*  Author: Lucienne Minks , MD 02/17/2022 9:13 AM  For on call review [email or web address].

## 2022-02-18 DIAGNOSIS — F10930 Alcohol use, unspecified with withdrawal, uncomplicated: Secondary | ICD-10-CM | POA: Diagnosis not present

## 2022-02-18 DIAGNOSIS — N179 Acute kidney failure, unspecified: Secondary | ICD-10-CM | POA: Diagnosis not present

## 2022-02-18 LAB — CBC WITH DIFFERENTIAL/PLATELET
Abs Immature Granulocytes: 0.02 10*3/uL (ref 0.00–0.07)
Basophils Absolute: 0 10*3/uL (ref 0.0–0.1)
Basophils Relative: 0 %
Eosinophils Absolute: 0 10*3/uL (ref 0.0–0.5)
Eosinophils Relative: 0 %
HCT: 37.4 % — ABNORMAL LOW (ref 39.0–52.0)
Hemoglobin: 12.8 g/dL — ABNORMAL LOW (ref 13.0–17.0)
Immature Granulocytes: 0 %
Lymphocytes Relative: 10 %
Lymphs Abs: 0.9 10*3/uL (ref 0.7–4.0)
MCH: 31.2 pg (ref 26.0–34.0)
MCHC: 34.2 g/dL (ref 30.0–36.0)
MCV: 91.2 fL (ref 80.0–100.0)
Monocytes Absolute: 1.2 10*3/uL — ABNORMAL HIGH (ref 0.1–1.0)
Monocytes Relative: 14 %
Neutro Abs: 6.6 10*3/uL (ref 1.7–7.7)
Neutrophils Relative %: 76 %
Platelets: 208 10*3/uL (ref 150–400)
RBC: 4.1 MIL/uL — ABNORMAL LOW (ref 4.22–5.81)
RDW: 12.9 % (ref 11.5–15.5)
WBC: 8.6 10*3/uL (ref 4.0–10.5)
nRBC: 0 % (ref 0.0–0.2)

## 2022-02-18 LAB — COMPREHENSIVE METABOLIC PANEL
ALT: 25 U/L (ref 0–44)
AST: 43 U/L — ABNORMAL HIGH (ref 15–41)
Albumin: 3.7 g/dL (ref 3.5–5.0)
Alkaline Phosphatase: 44 U/L (ref 38–126)
Anion gap: 10 (ref 5–15)
BUN: 38 mg/dL — ABNORMAL HIGH (ref 6–20)
CO2: 28 mmol/L (ref 22–32)
Calcium: 8.3 mg/dL — ABNORMAL LOW (ref 8.9–10.3)
Chloride: 96 mmol/L — ABNORMAL LOW (ref 98–111)
Creatinine, Ser: 1.1 mg/dL (ref 0.61–1.24)
GFR, Estimated: >60 mL/min (ref >60)
Glucose, Bld: 149 mg/dL — ABNORMAL HIGH (ref 70–99)
Potassium: 3.5 mmol/L (ref 3.5–5.1)
Sodium: 134 mmol/L — ABNORMAL LOW (ref 135–145)
Total Bilirubin: 1.2 mg/dL (ref 0.3–1.2)
Total Protein: 6.3 g/dL — ABNORMAL LOW (ref 6.5–8.1)

## 2022-02-18 LAB — PHOSPHORUS: Phosphorus: 3 mg/dL (ref 2.5–4.6)

## 2022-02-18 LAB — HEMOGLOBIN A1C
Hgb A1c MFr Bld: 5.3 % (ref 4.8–5.6)
Mean Plasma Glucose: 105.41 mg/dL

## 2022-02-18 LAB — MAGNESIUM: Magnesium: 2.8 mg/dL — ABNORMAL HIGH (ref 1.7–2.4)

## 2022-02-18 NOTE — Progress Notes (Signed)
PROGRESS NOTE    Alex Logan  [id number] DOB: 11-26-1965 DOA: 02/17/2022 [facility]CP: [facility]    Brief Narrative:  Alex Logan is a 57 y.o. male with past medical history of depression, hypertension, COPD, dementia, diabetes, hypothyroidism and history of alcohol abuse presented to hospital from [facility] ED with complaints of persistent vomiting with shaking tachycardia and signs of alcohol withdrawal.  He did have some blood mixed during vomitus.  In the ED, patient was hypotensive and tachycardic.  Initial labs showed mild leukocytosis at 18.9, alcohol level was less than 10.  Patient had mild hyponatremia with sodium of 125 and mild hyperkalemia at 5.2 bicarb was 17 and low and creatinine was elevated at 3.3.  Bilirubin was slightly elevated at 1.7.  Urine drug screen was negative.  Due to persistent nausea vomiting patient was then considered for admission to the hospital for further evaluation and treatment.  Assessment and plan.  Alcohol withdrawal, history of alcohol abuse. Continue telemetry monitoring, Ativan and CIWA protocol, Librium 3 times daily, D5 normal saline, thiamine folic acid gabapentin and supportive care. Currently little better.  Mild hyponatermia   received IV fluids.  Sodium level today at 134.   Acute kidney injury.  Continue IV fluids. Check bmp in am. Has improved.  Anion gap metabolic acidosis.  Improved with IV fluids.  Will continue to monitor closely.  Nausea and vomiting  - Maalox 30 mL PO q6 hr PRN.  Continue Zofran and supportive care.  Essential hypertension.   Continue Norvasc and propranolol.  Hiccups.- Baclofen 10 mg PO tid for now. Improved.  GERD  Continue Protonix       DVT prophylaxis: heparin injection 5,000 Units Start: 02/17/22 1400   Code Status:     Code Status: Full Code  Disposition: Home likely in 1-2 days.  Status is: Inpatient  Remains inpatient appropriate because: alcohol withdrawal, CIWA  protocol   Family Communication: none   Consultants:  None   Procedures:  None   Antimicrobials:  None   Anti-infectives (From admission, onward)    None       Subjective: Today, patient was seen and examined at bedside.Feels little better, little shaking but feels stressed from his life situations.  Objective: Vitals:   02/17/22 2300 02/18/22 0112 02/18/22 0528 02/18/22 0905  BP:  109/74 (!) 133/94 (!) 129/91  Pulse: 80 80 79 95  Resp:   17 18  Temp:  98.4 F (36.9 C) 98.1 F (36.7 C) 98.3 F (36.8 C)  TempSrc:  Oral Oral Oral  SpO2:  97% 98% 97%  Weight:      Height:        Intake/Output Summary (Last 24 hours) at 02/18/2022 1150 Last data filed at 02/18/2022 0900 Gross per 24 hour  Intake 2798.65 ml  Output 900 ml  Net 1898.65 ml    Filed Weights   02/17/22 1034  Weight: 74.1 kg    Physical Examination: Body mass index is 23.45 kg/m.   General:  Average built, not in obvious distress HENT:   No scleral pallor or icterus noted. Oral mucosa is moist.  Chest:  Clear breath sounds.  Diminished breath sounds bilaterally. No crackles or wheezes.  CVS: S1 &S2 heard. No murmur.  Regular rate and rhythm. Abdomen: Soft, nontender, nondistended.  Bowel sounds are heard.   Extremities: No cyanosis, clubbing or edema.  Peripheral pulses are palpable. Psych: Alert, awake and oriented, normal mood CNS:  No cranial nerve deficits.  Power  equal in all extremities.   Skin: Warm and dry.  No rashes noted.  Data Reviewed:   CBC: Recent Labs  Lab 02/17/22 0129 02/18/22 0020  WBC 18.9* 8.6  NEUTROABS  --  6.6  HGB 15.7 12.8*  HCT 44.0 37.4*  MCV 87.1 91.2  PLT 334 208     Basic Metabolic Panel: Recent Labs  Lab 02/17/22 0129 02/17/22 0640 02/17/22 1232 02/17/22 1859 02/18/22 0020  NA 125* 127* 126* 131* 134*  K 5.2* 5.0 5.2* 3.7 3.5  CL 70* 80* 84* 90* 96*  CO2 17* 28 24 29 28  $ GLUCOSE 178* 124* 192* 188* 149*  BUN 59* 57* 50* 46* 38*   CREATININE 3.32* 2.28* 1.95* 1.56* 1.10  CALCIUM 9.3 8.5* 8.2* 8.4* 8.3*  MG  --  2.1  --   --  2.8*  PHOS  --  4.7*  --   --  3.0     Liver Function Tests: Recent Labs  Lab 02/17/22 0129 02/18/22 0020  AST 35 43*  ALT 23 25  ALKPHOS 64 44  BILITOT 1.7* 1.2  PROT 8.2* 6.3*  ALBUMIN 5.1* 3.7      Radiology Studies: No results found.    LOS: 1 day    Time spent: 39 minutes spent on chart review, discussion with nursing staff, consultants, updating family and interview/physical exam; more than 50% of that time was spent in counseling and/or coordination of care.  Flora Lipps, MD [facility] Available via Epic secure chat 7am-7pm After these hours, please refer to coverage provider listed on [email or web address] 02/18/2022, 11:50 AM

## 2022-02-18 NOTE — Plan of Care (Signed)
  Problem: Education: Goal: Knowledge of General Education information will improve Description: Including pain rating scale, medication(s)/side effects and non-pharmacologic comfort measures Outcome: Progressing   Problem: Activity: Goal: Risk for activity intolerance will decrease Outcome: Progressing   Problem: Nutrition: Goal: Adequate nutrition will be maintained Outcome: Progressing   Problem: Coping: Goal: Level of anxiety will decrease Outcome: Progressing   

## 2022-02-18 NOTE — TOC Initial Note (Signed)
Transition of Care [facility]) - Initial/Assessment Note    Patient Details  Name: Alex Logan MRN: [id number] Date of Birth: 03-Feb-1965  [facility] CM/SW Contact:    Henrietta Dine, RN Phone Number: 02/18/2022, 3:13 PM  Clinical Narrative:                 [facility] consulted for SA; spoke w/ pt in room; pt says he lives at home and plans to return at d/c; he identified Appleby (ex-wife) [phone]; pt says he has transportation; he denies difficulty paying utilities, food insecurity, and IPV; he says he does not have glasses, HA, dentures, DME, HH services, or home oxygen; pt agrees to receiving resources for SA; resource guides  for residential and OP SA counseling placed in d/c instructions; copies of resources given to pt; he will make his own appt at agency of choice; no TOC needs.  Expected Discharge Plan: Home/Self Care Barriers to Discharge: Continued Medical Work up   Patient Goals and CMS Choice Patient states their goals for this hospitalization and ongoing recovery are:: home          Expected Discharge Plan and Services   Discharge Planning Services: CM Consult   Living arrangements for the past 2 months: Single Family Home                                      Prior Living Arrangements/Services Living arrangements for the past 2 months: Single Family Home Lives with:: Self Patient language and need for interpreter reviewed:: Yes Do you feel safe going back to the place where you live?: Yes      Need for Family Participation in Patient Care: Yes (Comment) Care giver support system in place?: Yes (comment)   Criminal Activity/Legal Involvement Pertinent to Current Situation/Hospitalization: No - Comment as needed  Activities of Daily Living Home Assistive Devices/Equipment: None ADL Screening (condition at time of admission) Patient's cognitive ability adequate to safely complete daily activities?: Yes Is the patient deaf or  have difficulty hearing?: No Does the patient have difficulty seeing, even when wearing glasses/contacts?: No Does the patient have difficulty concentrating, remembering, or making decisions?: No Patient able to express need for assistance with ADLs?: Yes Does the patient have difficulty dressing or bathing?: No Independently performs ADLs?: Yes (appropriate for developmental age) Does the patient have difficulty walking or climbing stairs?: No Weakness of Legs: None Weakness of Arms/Hands: None  Permission Sought/Granted Permission sought to share information with : Case Manager Permission granted to share information with : Yes, Verbal Permission Granted  Share Information with NAME: Lenor Coffin, RN, CM     Permission granted to share info w Relationship: Lido Frosch (ex-wife) [phone]     Emotional Assessment Appearance:: Appears stated age Attitude/Demeanor/Rapport: Gracious Affect (typically observed): Accepting Orientation: : Oriented to Self, Oriented to Place, Oriented to  Time, Oriented to Situation Alcohol / Substance Use: Alcohol Use Psych Involvement: No (comment)  Admission diagnosis:  Alcohol withdrawal ([place]) [F10.939] Hyponatremia [E87.1] Tachycardia [R00.0] Alcohol withdrawal syndrome without complication (HCC) 0000000 AKI (acute kidney injury) ([place]) [N17.9] Patient Active Problem List   Diagnosis Date Noted   AKI (acute kidney injury) ([place]) 02/17/2022   Thrombocytopenia ([place]) 08/11/2020   Tachycardia 01/14/2020   Elevated LFTs 05/27/2017   Muscular abdominal pain in right flank 05/27/2017   Fatty liver, alcoholic [id number]   Depression with anxiety 05/27/2017  Pancytopenia ([place]) 05/27/2017   SVT (supraventricular tachycardia)    HTN (hypertension) 05/23/2017   Acute vomiting    Pressure injury of skin 11/12/2016   Alcohol withdrawal ([place]) 11/10/2016   SIRS (systemic inflammatory response syndrome) ([place]) 11/10/2016   High anion gap metabolic  acidosis [id number]   Lactic acidosis 11/10/2016   Starvation ketoacidosis 11/10/2016   Alcohol use disorder, severe, dependence ([place]) 09/27/2016   Alcohol abuse with alcohol-induced mood disorder ([place]) 09/22/2016   PCP:  [facility]:   [place] [id number], [place] [address] Phone: [phone] Fax: [phone]     Social Determinants of Health (SDOH) Social History: [place]: No Food Insecurity (02/17/2022)  Housing: Low Risk  (02/17/2022)  Transportation Needs: No Transportation Needs (02/17/2022)  Utilities: Not At Risk (02/17/2022)  Alcohol Screen: Medium Risk (11/12/2016)  Tobacco Use: Low Risk  (02/17/2022)   SDOH Interventions: Housing Interventions: Intervention Not Indicated   Readmission Risk Interventions     No data to display

## 2022-02-18 NOTE — Progress Notes (Signed)
Mobility Specialist - Progress Note   02/18/22 1234  Mobility  Activity Ambulated with assistance in hallway  Level of Assistance Contact guard assist, steadying assist  Assistive Device None  Distance Ambulated (ft) 500 ft  Range of Motion/Exercises Active  Activity Response Tolerated well  Mobility Referral Yes  $Mobility charge 1 Mobility   Pt was found in bed and agreeable to ambulate. Was contact assist due to being unsteady when getting up and during ambulation had some cross steps. Stated having a bad knee as well. At EOS returned to bed with necessities in reach. RN notified.  Ferd Hibbs Mobility Specialist

## 2022-02-18 NOTE — Discharge Instructions (Signed)
[facility]

## 2022-02-18 NOTE — Hospital Course (Addendum)
Alex Logan is a 57 y.o. male with past medical history of depression, hypertension, COPD, dementia, diabetes, hypothyroidism and history of alcohol abuse presented to hospital from [facility] ED with complaints of persistent vomiting with shaking tachycardia and signs of alcohol withdrawal.  He did have some blood mixed during vomitus.  In the ED, patient was hypotensive and tachycardic.  Initial labs showed mild leukocytosis at 18.9, alcohol level was less than 10.  Patient had mild hyponatremia with sodium of 125 and mild hyperkalemia at 5.2 bicarb was 17 and low and creatinine was elevated at 3.3.  Bilirubin was slightly elevated at 1.7.  Urine drug screen was negative.  Due to persistent nausea vomiting patient was then considered for admission to the hospital for further evaluation and treatment.  Assessment and plan.  Alcohol withdrawal, history of alcohol abuse. Continue telemetry monitoring, Ativan and CIWA protocol, Librium 3 times daily, D5 normal saline, thiamine folic acid gabapentin and supportive care.  Mild hyponatermia   received IV fluids.  Sodium level today at 134.   Acute kidney injury.  Continue IV fluids.  Anion gap metabolic acidosis.  Improved with IV fluids.  Will continue to monitor closely.  Nausea and vomiting  - Maalox 30 mL PO q6 hr PRN.  Continue Zofran and supportive care.  Essential hypertension.   Continue Norvasc and propranolol.  Hiccups.- Baclofen 10 mg PO tid for now.  GERD  Continue Protonix

## 2022-02-18 NOTE — Progress Notes (Signed)
PROGRESS NOTE    Alex Logan  [id number] DOB: 1965-04-17 DOA: 02/17/2022 PCP: [facility]    Brief Narrative:  Alex Logan is a 57 y.o. male with past medical history of depression, hypertension, COPD, dementia, diabetes, hypothyroidism and history of alcohol abuse presented to hospital from [facility] ED with complaints of persistent vomiting with shaking tachycardia and signs of alcohol withdrawal.  He did have some blood mixed during vomitus.  In the ED, patient was hypotensive and tachycardic.  Initial labs showed mild leukocytosis at 18.9, alcohol level was less than 10.  Patient had mild hyponatremia with sodium of 125 and mild hyperkalemia at 5.2 bicarb was 17 and low and creatinine was elevated at 3.3.  Bilirubin was slightly elevated at 1.7.  Urine drug screen was negative.  Due to persistent nausea vomiting patient was then considered for admission to the hospital for further evaluation and treatment.  Assessment and plan.  Alcohol withdrawal, history of alcohol abuse. Continue telemetry monitoring, Ativan and CIWA protocol, Librium 3 times daily, D5 normal saline, thiamine folic acid gabapentin and supportive care.  Mild hyponatermia   received IV fluids.  Sodium level today at 134.   Acute kidney injury.  Continue IV fluids.  Anion gap metabolic acidosis.  Improved with IV fluids.  Will continue to monitor closely.  Nausea and vomiting  - Maalox 30 mL PO q6 hr PRN.  Continue Zofran and supportive care.  Essential hypertension.   Continue Norvasc and propranolol.  Hiccups.- Baclofen 10 mg PO tid for now.  GERD  Continue Protonix       DVT prophylaxis: heparin injection 5,000 Units Start: 02/17/22 1400   Code Status:     Code Status: Full Code  Disposition: Home   Status is: Inpatient  Remains inpatient appropriate because: alcohol withdrawal   Family Communication: none   Consultants:  None   Procedures:  None   Antimicrobials:   None   Anti-infectives (From admission, onward)    None       Subjective: Today, patient was seen and examined at bedside.Feels little better, little shaking   Objective: Vitals:   02/17/22 2300 02/18/22 0112 02/18/22 0528 02/18/22 0905  BP:  109/74 (!) 133/94 (!) 129/91  Pulse: 80 80 79 95  Resp:   17 18  Temp:  98.4 F (36.9 C) 98.1 F (36.7 C) 98.3 F (36.8 C)  TempSrc:  Oral Oral Oral  SpO2:  97% 98% 97%  Weight:      Height:        Intake/Output Summary (Last 24 hours) at 02/18/2022 1129 Last data filed at 02/18/2022 0900 Gross per 24 hour  Intake 2798.65 ml  Output 900 ml  Net 1898.65 ml   Filed Weights   02/17/22 1034  Weight: 74.1 kg    Physical Examination: Body mass index is 23.45 kg/m.  General:  Average built, not in obvious distress HENT:   No scleral pallor or icterus noted. Oral mucosa is moist.  Chest:  Clear breath sounds.  Diminished breath sounds bilaterally. No crackles or wheezes.  CVS: S1 &S2 heard. No murmur.  Regular rate and rhythm. Abdomen: Soft, nontender, nondistended.  Bowel sounds are heard.   Extremities: No cyanosis, clubbing or edema.  Peripheral pulses are palpable. Psych: Alert, awake and oriented, normal mood CNS:  No cranial nerve deficits.  Power equal in all extremities.   Skin: Warm and dry.  No rashes noted.  Data Reviewed:   CBC: Recent Labs  Lab 02/17/22 0129 02/18/22 0020  WBC 18.9* 8.6  NEUTROABS  --  6.6  HGB 15.7 12.8*  HCT 44.0 37.4*  MCV 87.1 91.2  PLT 334 [id number]    Basic Metabolic Panel: Recent Labs  Lab 02/17/22 0129 02/17/22 0640 02/17/22 1232 02/17/22 1859 02/18/22 0020  NA 125* 127* 126* 131* 134*  K 5.2* 5.0 5.2* 3.7 3.5  CL 70* 80* 84* 90* 96*  CO2 17* 28 24 29 28  $ GLUCOSE 178* 124* 192* 188* 149*  BUN 59* 57* 50* 46* 38*  CREATININE 3.32* 2.28* 1.95* 1.56* 1.10  CALCIUM 9.3 8.5* 8.2* 8.4* 8.3*  MG  --  2.1  --   --  2.8*  PHOS  --  4.7*  --   --  3.0    Liver Function  Tests: Recent Labs  Lab 02/17/22 0129 02/18/22 0020  AST 35 43*  ALT 23 25  ALKPHOS 64 44  BILITOT 1.7* 1.2  PROT 8.2* 6.3*  ALBUMIN 5.1* 3.7     Radiology Studies: No results found.    LOS: 1 day    Time spent: 39 minutes spent on chart review, discussion with nursing staff, consultants, updating family and interview/physical exam; more than 50% of that time was spent in counseling and/or coordination of care.  Flora Lipps, MD [facility] Available via Epic secure chat 7am-7pm After these hours, please refer to coverage provider listed on [email or web address] 02/18/2022, 11:29 AM

## 2022-02-19 DIAGNOSIS — N179 Acute kidney failure, unspecified: Secondary | ICD-10-CM | POA: Diagnosis not present

## 2022-02-19 DIAGNOSIS — F10930 Alcohol use, unspecified with withdrawal, uncomplicated: Secondary | ICD-10-CM | POA: Diagnosis not present

## 2022-02-19 LAB — CBC
HCT: 36.6 % — ABNORMAL LOW (ref 39.0–52.0)
Hemoglobin: 12.3 g/dL — ABNORMAL LOW (ref 13.0–17.0)
MCH: 31.5 pg (ref 26.0–34.0)
MCHC: 33.6 g/dL (ref 30.0–36.0)
MCV: 93.6 fL (ref 80.0–100.0)
Platelets: 156 10*3/uL (ref 150–400)
RBC: 3.91 MIL/uL — ABNORMAL LOW (ref 4.22–5.81)
RDW: 12.6 % (ref 11.5–15.5)
WBC: 4.9 10*3/uL (ref 4.0–10.5)
nRBC: 0 % (ref 0.0–0.2)

## 2022-02-19 LAB — BASIC METABOLIC PANEL
Anion gap: 6 (ref 5–15)
BUN: 14 mg/dL (ref 6–20)
CO2: 25 mmol/L (ref 22–32)
Calcium: 8.2 mg/dL — ABNORMAL LOW (ref 8.9–10.3)
Chloride: 106 mmol/L (ref 98–111)
Creatinine, Ser: 0.71 mg/dL (ref 0.61–1.24)
GFR, Estimated: >60 mL/min (ref >60)
Glucose, Bld: 104 mg/dL — ABNORMAL HIGH (ref 70–99)
Potassium: 3.7 mmol/L (ref 3.5–5.1)
Sodium: 137 mmol/L (ref 135–145)

## 2022-02-19 LAB — MAGNESIUM: Magnesium: 2.4 mg/dL (ref 1.7–2.4)

## 2022-02-19 MED ORDER — FOLIC ACID 1 MG PO TABS: 1.0000 mg | ORAL_TABLET | Freq: Every day | ORAL | 0 refills | Status: DC

## 2022-02-19 MED ORDER — GABAPENTIN 300 MG PO CAPS: 300.0000 mg | ORAL_CAPSULE | Freq: Three times a day (TID) | ORAL | 0 refills | Status: DC

## 2022-02-19 MED ORDER — VITAMIN B-1 100 MG PO TABS: 100.0000 mg | ORAL_TABLET | Freq: Every day | ORAL | 0 refills | Status: DC

## 2022-02-19 NOTE — Plan of Care (Signed)

## 2022-02-19 NOTE — Discharge Summary (Signed)
Physician Discharge Summary  Alex Logan [id number] DOB: 07-21-1965 DOA: 02/17/2022  PCP: [facility]  Admit date: 02/17/2022 Discharge date: 02/19/2022  Admitted From: Home  Discharge disposition: Home  Recommendations for Outpatient Follow-Up:   Follow up with your primary care provider in one week.  Check CBC, BMP, magnesium in the next visit  Discharge Diagnosis:   Principal Problem:   Alcohol withdrawal ([place]) Active Problems:   AKI (acute kidney injury) ([place])  Discharge Condition: Improved.  Diet recommendation: Regular  Wound care: None.  Code status: Full.   History of Present Illness:   Alex Logan is a 57 y.o. male with past medical history of depression, hypertension, COPD, dementia, diabetes, hypothyroidism and history of alcohol abuse presented to hospital from [facility] ED with complaints of persistent vomiting with shaking tachycardia and signs of alcohol withdrawal.  He did have some blood mixed during vomitus.  In the ED, patient was hypotensive and tachycardic.  Initial labs showed mild leukocytosis at 18.9, alcohol level was less than 10.  Patient had mild hyponatremia with sodium of 125 and mild hyperkalemia at 5.2 bicarb was 17 and low and creatinine was elevated at 3.3.  Bilirubin was slightly elevated at 1.7.  Urine drug screen was negative.  Due to persistent nausea vomiting patient was then considered for admission to the hospital for further evaluation and treatment.   Hospital Course:   Following conditions were addressed during hospitalization as listed below,  Mild alcohol withdrawal, history of alcohol abuse. Patient remained stable.  Did not require much doses of Ativan.  Was on Ativan protocol.  Continue thiamine folic acid and gabapentin on discharge.  Counseled against quitting alcohol.     Mild hyponatermia   received IV fluids.  Sodium level today at 137.   Acute kidney injury.  Improved after IV fluids.   Creatinine today at 0.7.   Anion gap metabolic acidosis.  Improved with IV fluids.    Nausea and vomiting  Resolved with supportive care.   Essential hypertension.   Continue Norvasc and propranolol from home.Marland Kitchen   Hiccups.-Has resolved at this time.   GERD  Continue Protonix from home  Disposition.  At this time, patient is stable for disposition home with outpatient PCP follow-up  Medical Consultants:   None.  Procedures:    None Subjective:   Today, patient was seen and examined at bedside.  Denies any nausea vomiting hiccups fever chills.  Denies any tremors sweating diaphoresis or hallucinations.  Has not required Ativan today.  Discharge Exam:   Vitals:   02/19/22 0507 02/19/22 1253  BP: (!) 143/97 (!) 141/85  Pulse: 73 88  Resp: 18 20  Temp: 97.9 F (36.6 C) 97.6 F (36.4 C)  SpO2: 99% 99%   Vitals:   02/18/22 0905 02/18/22 2045 02/19/22 0507 02/19/22 1253  BP: (!) 129/91 130/81 (!) 143/97 (!) 141/85  Pulse: 95 73 73 88  Resp: 18 20 18 20  $ Temp: 98.3 F (36.8 C) 97.7 F (36.5 C) 97.9 F (36.6 C) 97.6 F (36.4 C)  TempSrc: Oral Oral Oral Oral  SpO2: 97% 99% 99% 99%  Weight:      Height:       General: Alert awake, not in obvious distress, calm, Communicative. HENT: pupils equally reacting to light,  No scleral pallor or icterus noted. Oral mucosa is moist.  Chest:  Clear breath sounds.  Diminished breath sounds bilaterally. No crackles or wheezes.  CVS: S1 &S2 heard. No murmur.  Regular rate and rhythm. Abdomen: Soft, nontender, nondistended.  Bowel sounds are heard.   Extremities: No cyanosis, clubbing or edema.  Peripheral pulses are palpable. Psych: Alert, awake and oriented, normal mood CNS:  No cranial nerve deficits.  Power equal in all extremities.   Skin: Warm and dry.  No rashes noted.  The results of significant diagnostics from this hospitalization (including imaging, microbiology, ancillary and laboratory) are listed below for  reference.     Diagnostic Studies:   No results found.   Labs:   Basic Metabolic Panel: Recent Labs  Lab 02/17/22 0640 02/17/22 1232 02/17/22 1859 02/18/22 0020 02/19/22 0500  NA 127* 126* 131* 134* 137  K 5.0 5.2* 3.7 3.5 3.7  CL 80* 84* 90* 96* 106  CO2 28 24 29 28 25  $ GLUCOSE 124* 192* 188* 149* 104*  BUN 57* 50* 46* 38* 14  CREATININE 2.28* 1.95* 1.56* 1.10 0.71  CALCIUM 8.5* 8.2* 8.4* 8.3* 8.2*  MG 2.1  --   --  2.8* 2.4  PHOS 4.7*  --   --  3.0  --    GFR Estimated Creatinine Clearance: 107.7 mL/min (by C-G formula based on SCr of 0.71 mg/dL). Liver Function Tests: Recent Labs  Lab 02/17/22 0129 02/18/22 0020  AST 35 43*  ALT 23 25  ALKPHOS 64 44  BILITOT 1.7* 1.2  PROT 8.2* 6.3*  ALBUMIN 5.1* 3.7   Recent Labs  Lab 02/17/22 0129  LIPASE 64*   No results for input(s): "AMMONIA" in the last 168 hours. Coagulation profile No results for input(s): "INR", "PROTIME" in the last 168 hours.  CBC: Recent Labs  Lab 02/17/22 0129 02/18/22 0020 02/19/22 0500  WBC 18.9* 8.6 4.9  NEUTROABS  --  6.6  --   HGB 15.7 12.8* 12.3*  HCT 44.0 37.4* 36.6*  MCV 87.1 91.2 93.6  PLT 334 208 156   Cardiac Enzymes: No results for input(s): "CKTOTAL", "CKMB", "CKMBINDEX", "TROPONINI" in the last 168 hours. BNP: Invalid input(s): "POCBNP" CBG: No results for input(s): "GLUCAP" in the last 168 hours. D-Dimer No results for input(s): "DDIMER" in the last 72 hours. Hgb A1c Recent Labs    02/18/22 0020  HGBA1C 5.3   Lipid Profile No results for input(s): "CHOL", "HDL", "LDLCALC", "TRIG", "CHOLHDL", "LDLDIRECT" in the last 72 hours. Thyroid function studies Recent Labs    02/17/22 0730  TSH 1.355   Anemia work up No results for input(s): "VITAMINB12", "FOLATE", "FERRITIN", "TIBC", "IRON", "RETICCTPCT" in the last 72 hours. Microbiology No results found for this or any previous visit (from the past 240 hour(s)).   Discharge Instructions:   Discharge  Instructions     Call MD for:  persistant nausea and vomiting   Complete by: As directed    Call MD for:  severe uncontrolled pain   Complete by: As directed    Discharge instructions   Complete by: As directed    Follow-up with your primary care provider follow-up with your primary care provider in 1 week.  Check blood work at that time.  Please do not drink alcohol.  Seek medical attention for worsening symptoms.   Increase activity slowly   Complete by: As directed       Allergies as of 02/19/2022   No Known Allergies      Medication List     STOP taking these medications    ibuprofen 200 MG tablet Commonly known as: ADVIL       TAKE these medications  amLODipine 10 MG tablet Commonly known as: NORVASC Take 1 tablet (10 mg total) by mouth daily.   escitalopram 20 MG tablet Commonly known as: LEXAPRO Take 20 mg by mouth daily.   folic acid 1 MG tablet Commonly known as: FOLVITE Take 1 tablet (1 mg total) by mouth daily. Start taking on: February 20, 2022   gabapentin 300 MG capsule Commonly known as: NEURONTIN Take 1 capsule (300 mg total) by mouth 3 (three) times daily for 10 days.   lisinopril 20 MG tablet Commonly known as: ZESTRIL Take 20 mg by mouth daily.   multivitamin with minerals Tabs tablet Take 1 tablet by mouth daily.   naltrexone 50 MG tablet Commonly known as: DEPADE Take 50 mg by mouth daily.   pantoprazole 40 MG tablet Commonly known as: PROTONIX Take 40 mg by mouth daily.   propranolol ER 80 MG 24 hr capsule Commonly known as: INDERAL LA Take 80 mg by mouth daily.   thiamine 100 MG tablet Commonly known as: Vitamin B-1 Take 1 tablet (100 mg total) by mouth daily. Start taking on: February 20, 2022          Time coordinating discharge: 39 minutes  Signed:  Alberta Lenhard  [facility] 02/19/2022, 4:37 PM

## 2022-02-19 NOTE — Progress Notes (Signed)
Mobility Specialist - Progress Note   02/19/22 0936  Mobility  Activity Ambulated with assistance in hallway  Level of Assistance Standby assist, set-up cues, supervision of patient - no hands on  Assistive Device Other (Comment) (IV Pole)  Distance Ambulated (ft) 480 ft  Activity Response Tolerated well  Mobility Referral Yes  $Mobility charge 1 Mobility   Pt received in bed and agreeable to mobility. Pt did have some self corrected unsteadiness but voiced that he was okay & still eager to ambulate. No other complaints during session. Pt to bed after session with all needs met. Bed alarm turned back on.   [facility]

## 2022-04-25 ENCOUNTER — Encounter (HOSPITAL_BASED_OUTPATIENT_CLINIC_OR_DEPARTMENT_OTHER): Payer: Self-pay

## 2022-04-25 ENCOUNTER — Emergency Department (HOSPITAL_BASED_OUTPATIENT_CLINIC_OR_DEPARTMENT_OTHER): Payer: Commercial Managed Care - HMO | Admitting: Radiology

## 2022-04-25 ENCOUNTER — Other Ambulatory Visit: Payer: Self-pay

## 2022-04-25 ENCOUNTER — Emergency Department (HOSPITAL_BASED_OUTPATIENT_CLINIC_OR_DEPARTMENT_OTHER)
Admission: EM | Admit: 2022-04-25 | Discharge: 2022-04-25 | Disposition: A | Discharge status: Home / Self Care | Payer: Commercial Managed Care - HMO | Attending: Emergency Medicine | Admitting: Emergency Medicine

## 2022-04-25 ENCOUNTER — Other Ambulatory Visit (HOSPITAL_BASED_OUTPATIENT_CLINIC_OR_DEPARTMENT_OTHER): Payer: Self-pay

## 2022-04-25 DIAGNOSIS — W108XXA Fall (on) (from) other stairs and steps, initial encounter: Secondary | ICD-10-CM | POA: Insufficient documentation

## 2022-04-25 DIAGNOSIS — S43014A Anterior dislocation of right humerus, initial encounter: Secondary | ICD-10-CM | POA: Insufficient documentation

## 2022-04-25 DIAGNOSIS — S4291XA Fracture of right shoulder girdle, part unspecified, initial encounter for closed fracture: Secondary | ICD-10-CM | POA: Diagnosis not present

## 2022-04-25 DIAGNOSIS — M25511 Pain in right shoulder: Secondary | ICD-10-CM | POA: Diagnosis present

## 2022-04-25 MED ORDER — SODIUM CHLORIDE 0.9 % IV SOLN
INTRAVENOUS | Status: AC | PRN
  Administered 2022-04-25: 100 mL/h via INTRAVENOUS

## 2022-04-25 MED ORDER — PROPOFOL 10 MG/ML IV BOLUS
0.5000 mg/kg | Freq: Once | INTRAVENOUS | Status: DC
  Filled 2022-04-25: qty 20

## 2022-04-25 MED ORDER — OXYCODONE-ACETAMINOPHEN 5-325 MG PO TABS: 1.0000 | ORAL_TABLET | Freq: Once | ORAL | Status: DC

## 2022-04-25 MED ORDER — PROPOFOL 10 MG/ML IV BOLUS
INTRAVENOUS | Status: AC | PRN
  Administered 2022-04-25: 60 ug via INTRAVENOUS
  Administered 2022-04-25: 20 ug via INTRAVENOUS

## 2022-04-25 NOTE — ED Notes (Signed)
Patient verbalizes understanding of discharge instructions. Opportunity for questioning and answers were provided. Patient discharged from ED. Pt leaving in uber.

## 2022-04-25 NOTE — ED Provider Notes (Signed)
[name] EMERGENCY DEPARTMENT AT [facility] Provider Note   CSN: [id number] Arrival date & time: 04/25/22  4098     History  Chief Complaint  Patient presents with   Shoulder Pain    Alex Logan is a 57 y.o. male.   Shoulder Pain   57 year old male presents emergency department with complaints of right shoulder pain.  Patient states that he was walking down the steps earlier and slipped trying to catch his fall with his right arm.  He reports his right arm "popping out" during incident.  Reports history of similar occurrences with 3-4 dislocations of his right shoulder and states this feels the same.  Currently complaining of mild tingling and his distal fifth and fourth fingers of his right hand.  Reports pain with any movement of the shoulder.  Denies trauma elsewhere.  Denies trauma to head, loss of consciousness, chest pain, shortness of breath, abdominal pain, nausea, vomiting, blood thinner use.  Past medical history significant for hypertension, alcohol abuse, depression, anxiety, SVT  Home Medications Prior to Admission medications   Medication Sig Start Date End Date Taking? Authorizing Provider  amLODipine (NORVASC) 10 MG tablet Take 1 tablet (10 mg total) by mouth daily. 08/13/20   Lewie Chamber, MD  escitalopram (LEXAPRO) 20 MG tablet Take 20 mg by mouth daily. 11/08/20   [provider]  folic acid (FOLVITE) 1 MG tablet Take 1 tablet (1 mg total) by mouth daily. 02/20/22 05/31/22  Pokhrel, Rebekah Chesterfield, MD  gabapentin (NEURONTIN) 300 MG capsule Take 1 capsule (300 mg total) by mouth 3 (three) times daily for 10 days. 02/19/22 03/01/22  Pokhrel, Rebekah Chesterfield, MD  lisinopril (ZESTRIL) 20 MG tablet Take 20 mg by mouth daily. 01/25/22   [provider]  Multiple Vitamin (MULTIVITAMIN WITH MINERALS) TABS tablet Take 1 tablet by mouth daily. 01/17/20   Lewie Chamber, MD  pantoprazole (PROTONIX) 40 MG tablet Take 40 mg by mouth daily. 08/04/20   [provider]  propranolol ER (INDERAL LA) 80 MG 24 hr capsule Take 80 mg by mouth daily. 10/18/20   [provider]  thiamine (VITAMIN B-1) 100 MG tablet Take 1 tablet (100 mg total) by mouth daily. 02/20/22 05/31/22  Joycelyn Das, MD      Allergies    Patient has no known allergies.    Review of Systems   Review of Systems  All other systems reviewed and are negative.   Physical Exam Updated Vital Signs BP 129/78   Pulse 75   Temp 97.7 F (36.5 C) (Oral)   Resp 17   Ht  (1.778 m)   Wt 79.4 kg   SpO2 98%   BMI 25.11 kg/m  Physical Exam Vitals and nursing note reviewed.  Constitutional:      General: He is not in acute distress.    Appearance: He is well-developed.  HENT:     Head: Normocephalic and atraumatic.  Eyes:     Conjunctiva/sclera: Conjunctivae normal.  Cardiovascular:     Rate and Rhythm: Normal rate and regular rhythm.     Heart sounds: No murmur heard. Pulmonary:     Effort: Pulmonary effort is normal. No respiratory distress.     Breath sounds: Normal breath sounds.  Abdominal:     Palpations: Abdomen is soft.     Tenderness: There is no abdominal tenderness.  Musculoskeletal:        General: No swelling.     Cervical back: Neck supple.  Skin:  General: Skin is warm and dry.     Capillary Refill: Capillary refill takes less than 2 seconds.  Neurological:     Mental Status: He is alert.  Psychiatric:        Mood and Affect: Mood normal.     ED Results / Procedures / Treatments   Labs (all labs ordered are listed, but only abnormal results are displayed) Labs Reviewed - No data to display  EKG None  Radiology DG Shoulder Right Portable  Result Date: 04/25/2022 CLINICAL DATA:  Status post reduction of dislocation. EXAM: RIGHT SHOULDER - 1 VIEW COMPARISON:  Same day. FINDINGS: Interval reduction of previously described right glenohumeral joint dislocation. No definite fracture is noted. IMPRESSION: Successful reduction of  previously described right glenohumeral joint dislocation. Electronically Signed   By: Lupita Raider M.D.   On: 04/25/2022 12:17   DG Shoulder Right  Result Date: 04/25/2022 CLINICAL DATA:  Pain after fall EXAM: RIGHT SHOULDER - 3 VIEW COMPARISON:  10/04/2021 and older FINDINGS: Anterior shoulder dislocation noted with the humeral head anterior and medial to the glenoid. No underlying fracture. Small osteophytes along the AC joint. IMPRESSION: Anterior shoulder dislocation Electronically Signed   By: Karen Kays M.D.   On: 04/25/2022 10:32    Procedures .Sedation  Date/Time: 04/25/2022 11:52 AM  Performed by: Peter Garter, PA Authorized by: Peter Garter, PA   Consent:    Consent obtained:  Verbal and written   Consent given by:  Patient   Risks discussed:  Allergic reaction, dysrhythmia, inadequate sedation, nausea, vomiting, respiratory compromise necessitating ventilatory assistance and intubation, prolonged sedation necessitating reversal and prolonged hypoxia resulting in organ damage   Alternatives discussed:  Analgesia without sedation, anxiolysis and regional anesthesia Universal protocol:    Procedure explained and questions answered to patient or proxy's satisfaction: yes     Relevant documents present and verified: yes     Test results available: yes     Imaging studies available: yes     Immediately prior to procedure, a time out was called: yes     Patient identity confirmed:  Arm band and verbally with patient Indications:    Procedure performed:  Dislocation reduction   Procedure necessitating sedation performed by:  Physician performing sedation Pre-sedation assessment:    Time since last food or drink:  Midnight last night last meal. Few sips of coffee 7am   ASA classification: class 1 - normal, healthy patient     Mallampati score:  I - soft palate, uvula, fauces, pillars visible   Neck mobility: normal     Pre-sedation assessments completed and reviewed:  airway patency, cardiovascular function, hydration status, mental status, nausea/vomiting, pain level, respiratory function and temperature     Pre-sedation assessment completed:  04/25/2022 11:00 AM Immediate pre-procedure details:    Reassessment: Patient reassessed immediately prior to procedure     Reviewed: vital signs     Verified: bag valve mask available, emergency equipment available, intubation equipment available, IV patency confirmed, oxygen available and suction available   Procedure details (see MAR for exact dosages):    Preoxygenation:  Nasal cannula   Sedation:  Propofol   Intended level of sedation: deep   Analgesia:  None   Intra-procedure monitoring:  Blood pressure monitoring, cardiac monitor, continuous capnometry, continuous pulse oximetry, frequent LOC assessments and frequent vital sign checks   Intra-procedure events: none     Intra-procedure management:  Airway repositioning and supplemental oxygen   Total Provider sedation time (minutes):  12 Post-procedure details:    Post-sedation assessment completed:  04/25/2022 11:56 AM   Post-sedation assessments completed and reviewed: airway patency, cardiovascular function, hydration status, mental status, nausea/vomiting, pain level, respiratory function and temperature     Patient is stable for discharge or admission: yes     Procedure completion:  Tolerated well, no immediate complications     Medications Ordered in ED Medications  propofol (DIPRIVAN) 10 mg/mL bolus/IV push 39.7 mg (has no administration in time range)  0.9 %  sodium chloride infusion (0 mLs Intravenous Stopped 04/25/22 1257)  propofol (DIPRIVAN) 10 mg/mL bolus/IV push (20 mcg Intravenous Given 04/25/22 1147)    ED Course/ Medical Decision Making/ A&P Clinical Course as of 04/25/22 1328  Tue Apr 25, 2022  1011 Patient with last meal around midnight last night states he had a few sips of coffee otherwise n.p.o. today. [CR]    Clinical Course User  Index [CR] Peter Garter, PA                             Medical Decision Making Amount and/or Complexity of Data Reviewed Radiology: ordered.  Risk Prescription drug management.   This patient presents to the ED for concern of left shoulder pain, this involves an extensive number of treatment options, and is a complaint that carries with it a high risk of complications and morbidity.  The differential diagnosis includes fracture, strain chest pain, dislocation, neurovascular compromise   Co morbidities that complicate the patient evaluation  See HPI   Additional history obtained:  Additional history obtained from EMR External records from outside source obtained and reviewed including hospital records   Lab Tests:  N/a   Imaging Studies ordered:  I ordered imaging studies including left shoulder x-ray I independently visualized and interpreted imaging which showed  Previous location: Anterior dislocation Postreduction: Successful relocation of left shoulder I agree with the radiologist interpretation   Cardiac Monitoring: / EKG:  The patient was maintained on a cardiac monitor.  I personally viewed and interpreted the cardiac monitored which showed an underlying rhythm of: Sinus rhythm   Consultations Obtained:  I requested consultation with attending physician Dr.    Jeannine Kitten List / ED Course / Critical interventions / Medication management  Right shoulder dislocation I ordered medication including propofol Reevaluation of the patient after these medicines showed that the patient improved I have reviewed the patients home medicines and have made adjustments as needed   Social Determinants of Health:  Alcohol use.  Denies illicit drug use.   Test / Admission - Considered:  Right shoulder dislocation Vitals signs  within normal range and stable throughout visit. Imaging studies significant for: See above 57 year old male presents after  mechanical fall with evidence of right shoulder dislocation without associated fracture.  Shoulder was relocated in manner as depicted above successfully. After reduction, patient neurovascularly intact distal to affected shoulder injury. Patient placed in sling and swath on affected side.  Recommend rest, ice, nonsteroidals in the form of ibuprofen/Aleve and close follow-up with orthopedics for reassessment.  Treatment plan discussed at length with patient and he acknowledged understanding was agreeable to said plan.  Worrisome signs and symptoms were discussed with the patient, and the patient acknowledged understanding to return to the ED if noticed. Patient was stable upon discharge.          Final Clinical Impression(s) / ED Diagnoses Final diagnoses:  Traumatic closed displaced fracture of right shoulder  with anterior dislocation, initial encounter    Rx / DC Orders ED Discharge Orders     None         Peter Garter, [place] 04/25/22 1328    Margarita Grizzle, MD 04/26/22 [phone]

## 2022-04-25 NOTE — Discharge Instructions (Signed)
Note the workup today was overall reassuring.  Shoulder was relocated successfully.  Wear sling until follow-up with orthopedics outpatient.  He may take Tylenol/Motrin as needed for pain.  Please do not hesitate to return to emergency department for worrisome signs and symptoms we discussed become apparent.

## 2022-04-25 NOTE — ED Provider Notes (Signed)
57 year old male presents today complaining of right shoulder dislocation.  He states that he has done this in the past.  He reports putting off surgery due to work.  He said he slipped today and it popped out. Right shoulder x-Priti Consoli reviewed with dislocation noted Exam done with good pulses and sensation and movement  Reduction of dislocation  Date/Time: 04/25/2022 11:50 AM  Performed by: Margarita Grizzle, MD Authorized by: Margarita Grizzle, MD  Consent: Written consent obtained. Risks and benefits: risks, benefits and alternatives were discussed Consent given by: patient Patient understanding: patient states understanding of the procedure being performed Patient identity confirmed: verbally with patient and arm band Time out: Immediately prior to procedure a "time out" was called to verify the correct patient, procedure, equipment, support staff and site/side marked as required.  Sedation: Patient sedated: yes Sedation type: (deep) Sedatives: propofol Sedation end date/time: 04/25/2022 11:51 AM Vitals: Vital signs were monitored during sedation.  Patient tolerance: patient tolerated the procedure well with no immediate complications       Margarita Grizzle, MD 04/25/22 1151    Margarita Grizzle, MD 04/26/22 [phone]

## 2022-04-25 NOTE — ED Triage Notes (Signed)
States slipped and fell this am onto right should.  States it "popped" out.  Say this has happen before.Present with holding arm up.  Deformity noted

## 2022-05-30 ENCOUNTER — Other Ambulatory Visit (HOSPITAL_BASED_OUTPATIENT_CLINIC_OR_DEPARTMENT_OTHER): Payer: Self-pay

## 2022-05-30 ENCOUNTER — Emergency Department (HOSPITAL_BASED_OUTPATIENT_CLINIC_OR_DEPARTMENT_OTHER): Payer: Commercial Managed Care - HMO

## 2022-05-30 ENCOUNTER — Inpatient Hospital Stay (HOSPITAL_BASED_OUTPATIENT_CLINIC_OR_DEPARTMENT_OTHER)
Admission: EM | Admit: 2022-05-30 | Discharge: 2022-06-02 | DRG: 897 | Disposition: A | Discharge status: Home / Self Care | Payer: Commercial Managed Care - HMO | Attending: Family Medicine | Admitting: Family Medicine

## 2022-05-30 ENCOUNTER — Other Ambulatory Visit: Payer: Self-pay

## 2022-05-30 ENCOUNTER — Encounter (HOSPITAL_BASED_OUTPATIENT_CLINIC_OR_DEPARTMENT_OTHER): Payer: Self-pay | Admitting: Emergency Medicine

## 2022-05-30 DIAGNOSIS — R066 Hiccough: Secondary | ICD-10-CM | POA: Insufficient documentation

## 2022-05-30 DIAGNOSIS — E871 Hypo-osmolality and hyponatremia: Secondary | ICD-10-CM | POA: Diagnosis present

## 2022-05-30 DIAGNOSIS — E873 Alkalosis: Secondary | ICD-10-CM

## 2022-05-30 DIAGNOSIS — E878 Other disorders of electrolyte and fluid balance, not elsewhere classified: Secondary | ICD-10-CM | POA: Diagnosis present

## 2022-05-30 DIAGNOSIS — I10 Essential (primary) hypertension: Secondary | ICD-10-CM | POA: Diagnosis present

## 2022-05-30 DIAGNOSIS — R1110 Vomiting, unspecified: Secondary | ICD-10-CM | POA: Diagnosis not present

## 2022-05-30 DIAGNOSIS — Z79899 Other long term (current) drug therapy: Secondary | ICD-10-CM | POA: Diagnosis not present

## 2022-05-30 DIAGNOSIS — Z825 Family history of asthma and other chronic lower respiratory diseases: Secondary | ICD-10-CM | POA: Diagnosis not present

## 2022-05-30 DIAGNOSIS — Z818 Family history of other mental and behavioral disorders: Secondary | ICD-10-CM | POA: Diagnosis not present

## 2022-05-30 DIAGNOSIS — F1014 Alcohol abuse with alcohol-induced mood disorder: Secondary | ICD-10-CM | POA: Diagnosis not present

## 2022-05-30 DIAGNOSIS — F32A Depression, unspecified: Secondary | ICD-10-CM | POA: Diagnosis present

## 2022-05-30 DIAGNOSIS — F1024 Alcohol dependence with alcohol-induced mood disorder: Secondary | ICD-10-CM | POA: Diagnosis present

## 2022-05-30 DIAGNOSIS — F419 Anxiety disorder, unspecified: Secondary | ICD-10-CM | POA: Diagnosis present

## 2022-05-30 DIAGNOSIS — F10930 Alcohol use, unspecified with withdrawal, uncomplicated: Principal | ICD-10-CM

## 2022-05-30 DIAGNOSIS — H532 Diplopia: Secondary | ICD-10-CM | POA: Diagnosis present

## 2022-05-30 DIAGNOSIS — F10231 Alcohol dependence with withdrawal delirium: Secondary | ICD-10-CM | POA: Diagnosis not present

## 2022-05-30 DIAGNOSIS — E119 Type 2 diabetes mellitus without complications: Secondary | ICD-10-CM | POA: Diagnosis present

## 2022-05-30 DIAGNOSIS — E874 Mixed disorder of acid-base balance: Secondary | ICD-10-CM | POA: Diagnosis present

## 2022-05-30 DIAGNOSIS — J449 Chronic obstructive pulmonary disease, unspecified: Secondary | ICD-10-CM | POA: Diagnosis present

## 2022-05-30 DIAGNOSIS — R7401 Elevation of levels of liver transaminase levels: Secondary | ICD-10-CM | POA: Diagnosis present

## 2022-05-30 DIAGNOSIS — E039 Hypothyroidism, unspecified: Secondary | ICD-10-CM | POA: Diagnosis present

## 2022-05-30 DIAGNOSIS — R7989 Other specified abnormal findings of blood chemistry: Secondary | ICD-10-CM | POA: Diagnosis present

## 2022-05-30 DIAGNOSIS — N179 Acute kidney failure, unspecified: Secondary | ICD-10-CM | POA: Diagnosis not present

## 2022-05-30 DIAGNOSIS — E861 Hypovolemia: Secondary | ICD-10-CM | POA: Diagnosis present

## 2022-05-30 DIAGNOSIS — F10939 Alcohol use, unspecified with withdrawal, unspecified: Secondary | ICD-10-CM | POA: Diagnosis present

## 2022-05-30 DIAGNOSIS — E872 Acidosis, unspecified: Secondary | ICD-10-CM

## 2022-05-30 DIAGNOSIS — Z811 Family history of alcohol abuse and dependence: Secondary | ICD-10-CM

## 2022-05-30 DIAGNOSIS — D72829 Elevated white blood cell count, unspecified: Secondary | ICD-10-CM | POA: Diagnosis present

## 2022-05-30 DIAGNOSIS — E8729 Other acidosis: Secondary | ICD-10-CM | POA: Diagnosis present

## 2022-05-30 LAB — CK: Total CK: 1534 U/L — ABNORMAL HIGH (ref 49–397)

## 2022-05-30 LAB — I-STAT VENOUS BLOOD GAS, ED
Acid-Base Excess: 11 mmol/L — ABNORMAL HIGH (ref 0.0–2.0)
Bicarbonate: 29.8 mmol/L — ABNORMAL HIGH (ref 20.0–28.0)
Calcium, Ion: 0.95 mmol/L — ABNORMAL LOW (ref 1.15–1.40)
HCT: 42 % (ref 39.0–52.0)
Hemoglobin: 14.3 g/dL (ref 13.0–17.0)
O2 Saturation: 92 %
Patient temperature: 98.7
Potassium: 4.1 mmol/L (ref 3.5–5.1)
Sodium: 123 mmol/L — ABNORMAL LOW (ref 135–145)
TCO2: 31 mmol/L (ref 22–32)
pCO2, Ven: 23.9 mmHg — ABNORMAL LOW (ref 44–60)
pH, Ven: 7.705 (ref 7.25–7.43)
pO2, Ven: 47 mmHg — ABNORMAL HIGH (ref 32–45)

## 2022-05-30 LAB — COMPREHENSIVE METABOLIC PANEL
ALT: 43 U/L (ref 0–44)
AST: 66 U/L — ABNORMAL HIGH (ref 15–41)
Albumin: 4.8 g/dL (ref 3.5–5.0)
Alkaline Phosphatase: 56 U/L (ref 38–126)
Anion gap: 28 — ABNORMAL HIGH (ref 5–15)
BUN: 50 mg/dL — ABNORMAL HIGH (ref 6–20)
CO2: 22 mmol/L (ref 22–32)
Calcium: 9.5 mg/dL (ref 8.9–10.3)
Chloride: 78 mmol/L — ABNORMAL LOW (ref 98–111)
Creatinine, Ser: 3.8 mg/dL — ABNORMAL HIGH (ref 0.61–1.24)
GFR, Estimated: 18 mL/min — ABNORMAL LOW (ref >60)
Glucose, Bld: 133 mg/dL — ABNORMAL HIGH (ref 70–99)
Potassium: 4.1 mmol/L (ref 3.5–5.1)
Sodium: 128 mmol/L — ABNORMAL LOW (ref 135–145)
Total Bilirubin: 2.4 mg/dL — ABNORMAL HIGH (ref 0.3–1.2)
Total Protein: 7.7 g/dL (ref 6.5–8.1)

## 2022-05-30 LAB — CBC
HCT: 43 % (ref 39.0–52.0)
Hemoglobin: 15.5 g/dL (ref 13.0–17.0)
MCH: 31.2 pg (ref 26.0–34.0)
MCHC: 36 g/dL (ref 30.0–36.0)
MCV: 86.5 fL (ref 80.0–100.0)
Platelets: 424 10*3/uL — ABNORMAL HIGH (ref 150–400)
RBC: 4.97 MIL/uL (ref 4.22–5.81)
RDW: 13.2 % (ref 11.5–15.5)
WBC: 14.5 10*3/uL — ABNORMAL HIGH (ref 4.0–10.5)
nRBC: 0 % (ref 0.0–0.2)

## 2022-05-30 LAB — ETHANOL: Alcohol, Ethyl (B): <10 mg/dL (ref <10)

## 2022-05-30 LAB — LACTIC ACID, PLASMA
Lactic Acid, Venous: 2.3 mmol/L (ref 0.5–1.9)
Lactic Acid, Venous: 1.3 mmol/L (ref 0.5–1.9)

## 2022-05-30 LAB — MAGNESIUM: Magnesium: 1.9 mg/dL (ref 1.7–2.4)

## 2022-05-30 LAB — SALICYLATE LEVEL: Salicylate Lvl: <7 mg/dL — ABNORMAL LOW (ref 7.0–30.0)

## 2022-05-30 MED ORDER — ALUM & MAG HYDROXIDE-SIMETH 200-200-20 MG/5ML PO SUSP
15.0000 mL | ORAL | Status: DC | PRN
  Administered 2022-06-01 – 2022-06-02 (×4): 15 mL via ORAL
  Filled 2022-05-30 (×4): qty 30

## 2022-05-30 MED ORDER — THIAMINE HCL 100 MG/ML IJ SOLN
100.0000 mg | Freq: Every day | INTRAMUSCULAR | Status: DC
  Administered 2022-05-30: 100 mg via INTRAVENOUS
  Filled 2022-05-30: qty 2

## 2022-05-30 MED ORDER — LACTATED RINGERS IV BOLUS
1000.0000 mL | Freq: Once | INTRAVENOUS | Status: AC
  Administered 2022-05-30: 1000 mL via INTRAVENOUS

## 2022-05-30 MED ORDER — ACETAMINOPHEN 325 MG PO TABS: 650.0000 mg | ORAL_TABLET | Freq: Four times a day (QID) | ORAL | Status: DC | PRN

## 2022-05-30 MED ORDER — LORAZEPAM 2 MG/ML IJ SOLN
3.0000 mg | Freq: Once | INTRAMUSCULAR | Status: AC
  Administered 2022-05-30: 3 mg via INTRAVENOUS
  Filled 2022-05-30: qty 2

## 2022-05-30 MED ORDER — LORAZEPAM 2 MG/ML IJ SOLN
1.0000 mg | INTRAMUSCULAR | Status: DC | PRN
  Administered 2022-05-30: 4 mg via INTRAVENOUS
  Administered 2022-05-31: 2 mg via INTRAVENOUS
  Filled 2022-05-30: qty 1
  Filled 2022-05-30: qty 2

## 2022-05-30 MED ORDER — PANTOPRAZOLE SODIUM 40 MG PO TBEC
40.0000 mg | DELAYED_RELEASE_TABLET | Freq: Two times a day (BID) | ORAL | Status: DC
  Administered 2022-05-30 – 2022-06-02 (×6): 40 mg via ORAL
  Filled 2022-05-30 (×6): qty 1

## 2022-05-30 MED ORDER — LORAZEPAM 2 MG/ML IJ SOLN: 2.0000 mg | Freq: Once | INTRAMUSCULAR | Status: DC

## 2022-05-30 MED ORDER — LORAZEPAM 2 MG/ML IJ SOLN
0.0000 mg | Freq: Four times a day (QID) | INTRAMUSCULAR | Status: DC
  Administered 2022-05-30: 2 mg via INTRAVENOUS
  Filled 2022-05-30: qty 1

## 2022-05-30 MED ORDER — ENOXAPARIN SODIUM 40 MG/0.4ML IJ SOSY: 40.0000 mg | PREFILLED_SYRINGE | INTRAMUSCULAR | Status: DC

## 2022-05-30 MED ORDER — LACTATED RINGERS IV BOLUS: 1000.0000 mL | Freq: Once | INTRAVENOUS | Status: DC

## 2022-05-30 MED ORDER — ADULT MULTIVITAMIN W/MINERALS CH
1.0000 | ORAL_TABLET | Freq: Every day | ORAL | Status: DC
  Administered 2022-05-30 – 2022-06-02 (×4): 1 via ORAL
  Filled 2022-05-30 (×4): qty 1

## 2022-05-30 MED ORDER — PANTOPRAZOLE SODIUM 40 MG PO TBEC: 40.0000 mg | DELAYED_RELEASE_TABLET | Freq: Every day | ORAL | Status: DC

## 2022-05-30 MED ORDER — LORAZEPAM 2 MG/ML IJ SOLN: 0.0000 mg | Freq: Two times a day (BID) | INTRAMUSCULAR | Status: DC

## 2022-05-30 MED ORDER — ONDANSETRON HCL 4 MG/2ML IJ SOLN
4.0000 mg | Freq: Once | INTRAMUSCULAR | Status: AC
  Administered 2022-05-30: 4 mg via INTRAVENOUS
  Filled 2022-05-30: qty 2

## 2022-05-30 MED ORDER — POLYETHYLENE GLYCOL 3350 17 G PO PACK
17.0000 g | PACK | Freq: Two times a day (BID) | ORAL | Status: AC
  Administered 2022-05-30 – 2022-06-01 (×4): 17 g via ORAL
  Filled 2022-05-30 (×4): qty 1

## 2022-05-30 MED ORDER — LORAZEPAM 1 MG PO TABS: 0.0000 mg | ORAL_TABLET | Freq: Four times a day (QID) | ORAL | Status: DC

## 2022-05-30 MED ORDER — SODIUM CHLORIDE 0.9 % IV SOLN: INTRAVENOUS | Status: AC

## 2022-05-30 MED ORDER — ACETAMINOPHEN 650 MG RE SUPP: 650.0000 mg | Freq: Four times a day (QID) | RECTAL | Status: DC | PRN

## 2022-05-30 MED ORDER — LORAZEPAM 1 MG PO TABS: 1.0000 mg | ORAL_TABLET | ORAL | Status: DC | PRN

## 2022-05-30 MED ORDER — LORAZEPAM 1 MG PO TABS
0.0000 mg | ORAL_TABLET | Freq: Four times a day (QID) | ORAL | Status: AC
  Administered 2022-05-31 (×3): 1 mg via ORAL
  Administered 2022-05-31: 2 mg via ORAL
  Administered 2022-06-01: 1 mg via ORAL
  Filled 2022-05-30 (×3): qty 1
  Filled 2022-05-30: qty 2
  Filled 2022-05-30: qty 1

## 2022-05-30 MED ORDER — THIAMINE MONONITRATE 100 MG PO TABS: 100.0000 mg | ORAL_TABLET | Freq: Every day | ORAL | Status: DC

## 2022-05-30 MED ORDER — ORAL CARE MOUTH RINSE: 15.0000 mL | OROMUCOSAL | Status: DC | PRN

## 2022-05-30 MED ORDER — ONDANSETRON HCL 4 MG/2ML IJ SOLN
4.0000 mg | Freq: Four times a day (QID) | INTRAMUSCULAR | Status: DC | PRN
  Administered 2022-05-30 – 2022-05-31 (×2): 4 mg via INTRAVENOUS
  Filled 2022-05-30 (×2): qty 2

## 2022-05-30 MED ORDER — LORAZEPAM 1 MG PO TABS
0.0000 mg | ORAL_TABLET | Freq: Two times a day (BID) | ORAL | Status: DC
  Administered 2022-06-01: 1 mg via ORAL
  Filled 2022-05-30: qty 1

## 2022-05-30 MED ORDER — LORAZEPAM 2 MG/ML IJ SOLN
2.0000 mg | Freq: Once | INTRAMUSCULAR | Status: AC
  Administered 2022-05-30: 2 mg via INTRAVENOUS
  Filled 2022-05-30: qty 1

## 2022-05-30 MED ORDER — LORAZEPAM 1 MG PO TABS: 0.0000 mg | ORAL_TABLET | Freq: Two times a day (BID) | ORAL | Status: DC

## 2022-05-30 MED ORDER — ONDANSETRON HCL 4 MG PO TABS: 4.0000 mg | ORAL_TABLET | Freq: Four times a day (QID) | ORAL | Status: DC | PRN

## 2022-05-30 MED ORDER — FOLIC ACID 1 MG PO TABS
1.0000 mg | ORAL_TABLET | Freq: Every day | ORAL | Status: DC
  Administered 2022-05-30 – 2022-06-02 (×4): 1 mg via ORAL
  Filled 2022-05-30 (×4): qty 1

## 2022-05-30 MED ORDER — PROPRANOLOL HCL ER 80 MG PO CP24
80.0000 mg | ORAL_CAPSULE | Freq: Every day | ORAL | Status: DC
  Administered 2022-05-30 – 2022-06-02 (×4): 80 mg via ORAL
  Filled 2022-05-30 (×4): qty 1

## 2022-05-30 MED ORDER — SODIUM CHLORIDE 0.9 % IV BOLUS: 2000.0000 mL | Freq: Once | INTRAVENOUS | Status: DC

## 2022-05-30 MED ORDER — LORAZEPAM 1 MG PO TABS
2.0000 mg | ORAL_TABLET | ORAL | Status: AC
  Administered 2022-05-30 (×2): 2 mg via ORAL
  Filled 2022-05-30 (×2): qty 2

## 2022-05-30 MED ORDER — THIAMINE MONONITRATE 100 MG PO TABS
100.0000 mg | ORAL_TABLET | Freq: Every day | ORAL | Status: DC
  Administered 2022-05-31 – 2022-06-02 (×3): 100 mg via ORAL
  Filled 2022-05-30 (×3): qty 1

## 2022-05-30 MED ORDER — FOLIC ACID 1 MG PO TABS: 1.0000 mg | ORAL_TABLET | Freq: Every day | ORAL | Status: DC

## 2022-05-30 NOTE — ED Notes (Signed)
Alex Logan with cl called for transport 

## 2022-05-30 NOTE — H&P (Signed)
History and Physical  DANTHONY STONEBREAKER ZOX:[id number] DOB: 04-Jul-1965 DOA: 05/30/2022  PCP: [facility] Patient coming from: Home  I have personally briefly reviewed patient's old medical records in [facility]   Chief Complaint: Alcohol withdrawal  HPI: Alex Logan is a 57 y.o. male past medical history of alcohol abuse and acute kidney injury comes into the hospital for alcohol withdrawal he relates his last drink was at 24 hours prior to admission he relates he went binge drinking drank about 3 bottles of wine.  Relates no abdominal pain some hiccups, no fever he relates he has had alcohol withdrawal in the past, he relates tremors.  Denies any chest pain shortness of breath. Some nausea no vomiting he continue to take his ACE inhibitor and propranolol   In the ED: In the ED was found to be in acute kidney injury with a creatinine of 3.8 (last creatinine less than 1 about 2 months ago), hyponatremia, hypochloremia elevated LFTs 221 ratio AST to ALT, lactic acid of 2.3 mild leukocytosis alcohol levels less than 10 and salicylate levels are unremarkable.  Twelve-lead EKG shows sinus tach.  CT of the head showed no acute findings, x-ray showed no acute cardiopulmonary disease.   Review of Systems: All systems reviewed and apart from history of presenting illness, are negative.  Past Medical History:  Diagnosis Date   Alcohol abuse    Anxiety    Depression    Hypertension    Past Surgical History:  Procedure Laterality Date   NO PAST SURGERIES     Social History:  reports that he has never smoked. He has never used smokeless tobacco. He reports current alcohol use. He reports that he does not use drugs.   No Known Allergies  Family History  Problem Relation Age of Onset   Emphysema Mother    Bipolar disorder Father    Alcohol abuse Father    Mental illness Neg Hx     Prior to Admission medications   Medication Sig Start Date End Date Taking?  Authorizing Provider  amLODipine (NORVASC) 10 MG tablet Take 1 tablet (10 mg total) by mouth daily. 08/13/20  Yes Lewie Chamber, MD  escitalopram (LEXAPRO) 20 MG tablet Take 20 mg by mouth daily. 11/08/20  Yes [provider]  folic acid (FOLVITE) 1 MG tablet Take 1 tablet (1 mg total) by mouth daily. 02/20/22 05/31/22 Yes Pokhrel, Laxman, MD  lisinopril (ZESTRIL) 20 MG tablet Take 20 mg by mouth daily. 01/25/22  Yes [provider]  pantoprazole (PROTONIX) 40 MG tablet Take 40 mg by mouth daily. 08/04/20  Yes [provider]  propranolol ER (INDERAL LA) 80 MG 24 hr capsule Take 80 mg by mouth daily. 10/18/20  Yes [provider]  gabapentin (NEURONTIN) 300 MG capsule Take 1 capsule (300 mg total) by mouth 3 (three) times daily for 10 days. 02/19/22 03/01/22  Pokhrel, Rebekah Chesterfield, MD  Multiple Vitamin (MULTIVITAMIN WITH MINERALS) TABS tablet Take 1 tablet by mouth daily. Patient not taking: Reported on 05/30/2022 01/17/20   Lewie Chamber, MD  thiamine (VITAMIN B-1) 100 MG tablet Take 1 tablet (100 mg total) by mouth daily. Patient not taking: Reported on 05/30/2022 02/20/22 05/31/22  Joycelyn Das, MD   Physical Exam: Vitals:   05/30/22 1300 05/30/22 1359 05/30/22 1424 05/30/22 1515  BP: (!) 166/94 (!) 166/94  (!) 159/99  Pulse:  (!) 135  (!) 131  Resp: 17   (!) 22  Temp:  98.4 F (36.9 C) 98.9 F (37.2 C)  TempSrc:   Oral Oral  SpO2: 100%   99%  Weight:      Height:        General exam: Moderately built and nourished patient, lying comfortably supine. Head, eyes and ENT: Nontraumatic and normocephalic.  Neck: Supple. No JVD, carotid bruit or thyromegaly. Lymphatics: No lymphadenopathy. Respiratory system: Clear to auscultation. No increased work of breathing. Cardiovascular system: S1 and S2 heard, RRR. No JVD. Gastrointestinal system: Positive bowel sounds soft nontender nondistended d. Extremities: Symmetric 5 x 5 power. Peripheral pulses symmetrically  felt.  Skin: No rashes or acute findings. Musculoskeletal system: Negative exam. Psychiatry: Pleasant and cooperative.   Labs on Admission:  Basic Metabolic Panel: Recent Labs  Lab 05/30/22 1029 05/30/22 1213  NA 128* 123*  K 4.1 4.1  CL 78*  --   CO2 22  --   GLUCOSE 133*  --   BUN 50*  --   CREATININE 3.80*  --   CALCIUM 9.5  --    Liver Function Tests: Recent Labs  Lab 05/30/22 1029  AST 66*  ALT 43  ALKPHOS 56  BILITOT 2.4*  PROT 7.7  ALBUMIN 4.8   No results for input(s): "LIPASE", "AMYLASE" in the last 168 hours. No results for input(s): "AMMONIA" in the last 168 hours. CBC: Recent Labs  Lab 05/30/22 1022 05/30/22 1213  WBC 14.5*  --   HGB 15.5 14.3  HCT 43.0 42.0  MCV 86.5  --   PLT 424*  --    Cardiac Enzymes: No results for input(s): "CKTOTAL", "CKMB", "CKMBINDEX", "TROPONINI" in the last 168 hours.  BNP (last 3 results) No results for input(s): "PROBNP" in the last 8760 hours. CBG: No results for input(s): "GLUCAP" in the last 168 hours.  Radiological Exams on Admission: DG Chest Portable 1 View  Result Date: 05/30/2022 CLINICAL DATA:  Shortness of breath. EXAM: PORTABLE CHEST 1 VIEW COMPARISON:  11/10/2016. FINDINGS: Clear lungs. Normal heart size and mediastinal contours. No pleural effusion or pneumothorax. Visualized bones and upper abdomen are unremarkable. IMPRESSION: No evidence of acute cardiopulmonary disease. Electronically Signed   By: Orvan Falconer M.D.   On: 05/30/2022 11:09   CT Head Wo Contrast  Result Date: 05/30/2022 CLINICAL DATA:  Headache, new onset. EXAM: CT HEAD WITHOUT CONTRAST TECHNIQUE: Contiguous axial images were obtained from the base of the skull through the vertex without intravenous contrast. RADIATION DOSE REDUCTION: This exam was performed according to the departmental dose-optimization program which includes automated exposure control, adjustment of the mA and/or kV according to patient size and/or use of  iterative reconstruction technique. COMPARISON:  Head CT 12/06/2012.  MRI brain 12/06/2012. FINDINGS: Brain: No acute intracranial hemorrhage. Gray-white differentiation is preserved. No hydrocephalus or extra-axial collection. No mass effect or midline shift. Vascular: No hyperdense vessel or unexpected calcification. Skull: No calvarial fracture or suspicious bone lesion. Skull base is unremarkable. Sinuses/Orbits: Paranasal sinuses are well aerated. Chronic defect in the right orbital floor. Other: None. IMPRESSION: No acute intracranial abnormality. Electronically Signed   By: Orvan Falconer M.D.   On: 05/30/2022 11:03    EKG: Independently reviewed.  Sinus tach normal axis nonspecific T wave changes  Assessment/Plan Alcohol withdrawal delirium, acute, hypoactive (HCC)/  Alcohol withdrawal (HCC) He relates that after he got Ativan in the ED his symptoms were improved. Still his CIWA score was 14 Continue thiamine and folate, monitor with CIWA protocol and continue Ativan. Will give him 2 extra doses  of Ativan.  Acute kidney injury: Check a CK, will bolus him 2 L of normal saline and continue normal saline for 24 hours. Monitor strict I's and O's and daily weights. Recheck basic metabolic panel in the morning. Likely prerenal azotemia in the setting of ACE inhibitor as chloride is low. Hold ACE inhibitor. Monitor electrolytes replete as needed, check a magnesium level  High anion gap metabolic acidosis: Likely due to lactic acid elevation, acute kidney injury . Will hydrate him aggressively recheck basic metabolic panel in the morning. Monitor strict I's and O's and daily weights.  Hypovolemic hyponatremia: Has had nausea with significant decreased oral intake. Hydrate him aggressively recheck basic metabolic panel in the morning.  Hiccups Protonix twice a day. Maalox as needed     DVT Prophylaxis: lovenox Code Status: Full Family Communication: none  Disposition Plan:  inpatient      It is my clinical opinion that admission to INPATIENT is reasonable and necessary in this 57 y.o. male presenting with symptoms of alcohol withdrawal tremors and acute kidney injury  Given the aforementioned, the predictability of an adverse outcome is felt to be significant. I expect that the patient will require at least 2 midnights in the hospital to treat this condition.  Marinda Elk MD [facility]   05/30/2022, 5:28 PM

## 2022-05-30 NOTE — ED Triage Notes (Signed)
Pt arrives to ED with c/o alcohol withdraw. Last drink 24hrs.

## 2022-05-30 NOTE — Plan of Care (Signed)
  Problem: Education: Goal: Knowledge of General Education information will improve Description: Including pain rating scale, medication(s)/side effects and non-pharmacologic comfort measures Outcome: Not Progressing   Problem: Health Behavior/Discharge Planning: Goal: Ability to manage health-related needs will improve Outcome: Not Progressing   

## 2022-05-30 NOTE — ED Provider Notes (Incomplete)
[place] EMERGENCY DEPARTMENT AT [facility] Provider Note   CSN: [id number] Arrival date & time: 05/30/22  1004     History {Add pertinent medical, surgical, social history, OB history to HPI:1} Chief Complaint  Patient presents with   Alcohol Problem    Alex Logan is a 57 y.o. male.  HPI      Drink a pint, went on a binge had 3 half gallons and bottle of wine and last drink 24 hours ago stopped  Nausea, vomiting, shakiness, anxiety Blurry, double vision yesterday Denies numbness, weakness, difficulty talking or walking, or facial droop.  No known seizure history Prior etoh withdrawal  Headache No head trauma  Home Medications Prior to Admission medications   Medication Sig Start Date End Date Taking? Authorizing Provider  amLODipine (NORVASC) 10 MG tablet Take 1 tablet (10 mg total) by mouth daily. 08/13/20   Lewie Chamber, MD  escitalopram (LEXAPRO) 20 MG tablet Take 20 mg by mouth daily. 11/08/20   [provider]  folic acid (FOLVITE) 1 MG tablet Take 1 tablet (1 mg total) by mouth daily. 02/20/22 05/31/22  Pokhrel, Rebekah Chesterfield, MD  gabapentin (NEURONTIN) 300 MG capsule Take 1 capsule (300 mg total) by mouth 3 (three) times daily for 10 days. 02/19/22 03/01/22  Pokhrel, Rebekah Chesterfield, MD  lisinopril (ZESTRIL) 20 MG tablet Take 20 mg by mouth daily. 01/25/22   [provider]  Multiple Vitamin (MULTIVITAMIN WITH MINERALS) TABS tablet Take 1 tablet by mouth daily. 01/17/20   Lewie Chamber, MD  pantoprazole (PROTONIX) 40 MG tablet Take 40 mg by mouth daily. 08/04/20   [provider]  propranolol ER (INDERAL LA) 80 MG 24 hr capsule Take 80 mg by mouth daily. 10/18/20   [provider]  thiamine (VITAMIN B-1) 100 MG tablet Take 1 tablet (100 mg total) by mouth daily. 02/20/22 05/31/22  Joycelyn Das, MD      Allergies    Patient has no known allergies.    Review of Systems   Review of Systems  Physical Exam Updated Vital Signs BP  (!) 152/106 (BP Location: Right Arm)   Pulse (!) 142   Temp 98.7 F (37.1 C) (Oral)   Resp (!) 21   Ht 5\' 10"  (1.778 m)   Wt 81.6 kg   SpO2 98%   BMI 25.83 kg/m  Physical Exam Vitals and nursing note reviewed.  Constitutional:      General: He is in acute distress.     Appearance: He is well-developed. He is ill-appearing. He is not diaphoretic.  HENT:     Head: Normocephalic and atraumatic.  Eyes:     Conjunctiva/sclera: Conjunctivae normal.  Cardiovascular:     Rate and Rhythm: Regular rhythm. Tachycardia present.     Heart sounds: Normal heart sounds. No murmur heard.    No friction rub. No gallop.  Pulmonary:     Effort: Pulmonary effort is normal. No respiratory distress.     Breath sounds: Normal breath sounds. No wheezing or rales.  Abdominal:     General: There is no distension.     Palpations: Abdomen is soft.     Tenderness: There is no abdominal tenderness. There is no guarding.  Musculoskeletal:     Cervical back: Normal range of motion.  Skin:    General: Skin is warm and dry.  Neurological:     Mental Status: He is alert and oriented to person, place, and time.     ED Results / Procedures / Treatments  Labs (all labs ordered are listed, but only abnormal results are displayed) Labs Reviewed  CBC  ETHANOL  COMPREHENSIVE METABOLIC PANEL    EKG None  Radiology No results found.  Procedures Procedures  {Document cardiac monitor, telemetry assessment procedure when appropriate:1}  Medications Ordered in ED Medications - No data to display  ED Course/ Medical Decision Making/ A&P   {   Click here for ABCD2, HEART and other calculatorsREFRESH Note before signing :1}                          57 year old male with a history of depression, hypertension, COPD, diabetes, hypothyroidism, alcohol abuse, admission in February for alcohol withdrawal and AKI, who presents with concern for alcohol withdrawal symptoms of shakiness, nausea and vomiting,  headache with last drink yesterday.    {Document critical care time when appropriate:1} {Document review of labs and clinical decision tools ie heart score, Chads2Vasc2 etc:1}  {Document your independent review of radiology images, and any outside records:1} {Document your discussion with family members, caretakers, and with consultants:1} {Document social determinants of health affecting pt's care:1} {Document your decision making why or why not admission, treatments were needed:1} Final Clinical Impression(s) / ED Diagnoses Final diagnoses:  None    Rx / DC Orders ED Discharge Orders     None

## 2022-05-31 DIAGNOSIS — F1014 Alcohol abuse with alcohol-induced mood disorder: Secondary | ICD-10-CM

## 2022-05-31 DIAGNOSIS — R1110 Vomiting, unspecified: Secondary | ICD-10-CM

## 2022-05-31 DIAGNOSIS — N179 Acute kidney failure, unspecified: Secondary | ICD-10-CM | POA: Diagnosis not present

## 2022-05-31 LAB — COMPREHENSIVE METABOLIC PANEL
ALT: 67 U/L — ABNORMAL HIGH (ref 0–44)
AST: 102 U/L — ABNORMAL HIGH (ref 15–41)
Albumin: 3.8 g/dL (ref 3.5–5.0)
Alkaline Phosphatase: 50 U/L (ref 38–126)
Anion gap: 11 (ref 5–15)
BUN: 44 mg/dL — ABNORMAL HIGH (ref 6–20)
CO2: 30 mmol/L (ref 22–32)
Calcium: 8.6 mg/dL — ABNORMAL LOW (ref 8.9–10.3)
Chloride: 89 mmol/L — ABNORMAL LOW (ref 98–111)
Creatinine, Ser: 2.02 mg/dL — ABNORMAL HIGH (ref 0.61–1.24)
GFR, Estimated: 38 mL/min — ABNORMAL LOW (ref >60)
Glucose, Bld: 120 mg/dL — ABNORMAL HIGH (ref 70–99)
Potassium: 3.5 mmol/L (ref 3.5–5.1)
Sodium: 130 mmol/L — ABNORMAL LOW (ref 135–145)
Total Bilirubin: 2 mg/dL — ABNORMAL HIGH (ref 0.3–1.2)
Total Protein: 7 g/dL (ref 6.5–8.1)

## 2022-05-31 LAB — HIV ANTIBODY (ROUTINE TESTING W REFLEX): HIV Screen 4th Generation wRfx: NONREACTIVE

## 2022-05-31 LAB — CBC
HCT: 42.5 % (ref 39.0–52.0)
Hemoglobin: 14.8 g/dL (ref 13.0–17.0)
MCH: 32 pg (ref 26.0–34.0)
MCHC: 34.8 g/dL (ref 30.0–36.0)
MCV: 92 fL (ref 80.0–100.0)
Platelets: 319 10*3/uL (ref 150–400)
RBC: 4.62 MIL/uL (ref 4.22–5.81)
RDW: 13 % (ref 11.5–15.5)
WBC: 10.9 10*3/uL — ABNORMAL HIGH (ref 4.0–10.5)
nRBC: 0 % (ref 0.0–0.2)

## 2022-05-31 NOTE — Progress Notes (Signed)
  Progress Note   Patient: Alex Logan RUE:[id number] DOB: Nov 29, 1965 DOA: 05/30/2022     1 DOS: the patient was seen and examined on 05/31/2022   Brief hospital course: 57 year old man PMH alcohol abuse presented to the hospital with alcohol withdrawal after recent binge.  Admitted for alcohol withdrawal, acute kidney injury, anion gap metabolic acidosis.  Assessment and Plan: Alcohol withdrawal delirium, acute, hypoactive (HCC)/  Alcohol withdrawal (HCC) Alcohol abuse with transaminitis Vomiting Still having withdrawal symptoms and requiring Ativan.  Continue present management.   Acute kidney injury Secondary to poor oral intake secondary to withdrawal.  Renal function improving with IV fluids, now down to 2.02. BMP in AM   High anion gap metabolic acidosis Secondary to ethanol Resolved   Hypovolemic hyponatremia Much improved with hydration  Expect resolution with IVF      Subjective:  Reports vomiting Withdrawal symptoms No hallucinations   Physical Exam: Vitals:   05/31/22 0125 05/31/22 0540 05/31/22 0952 05/31/22 1209  BP: (!) 143/102 106/74 114/85 105/80  Pulse: (!) 52 89 96 90  Resp: 16 18 19 20   Temp: (!) 97.5 F (36.4 C) 98.4 F (36.9 C) 98 F (36.7 C) 98.6 F (37 C)  TempSrc: Oral Oral Oral Oral  SpO2: 97% 98% 97% 98%  Weight:      Height:       Physical Exam Vitals reviewed.  Constitutional:      General: He is not in acute distress.    Appearance: He is not ill-appearing or toxic-appearing.  Cardiovascular:     Rate and Rhythm: Normal rate and regular rhythm.     Heart sounds: No murmur heard. Pulmonary:     Effort: Pulmonary effort is normal. No respiratory distress.     Breath sounds: No wheezing, rhonchi or rales.  Neurological:     Mental Status: He is alert.  Psychiatric:        Mood and Affect: Mood normal.        Behavior: Behavior normal.     Data Reviewed: Na+ 130 Creatinine down to 2.02 AST 102 ALT 67 Tbili down to  2.4  Family Communication: none  Disposition: Status is: Inpatient Remains inpatient appropriate because: acute alcohol withdrawal  Planned Discharge Destination: Home    Time spent: 35 minutes  Author: Brendia Sacks, MD 05/31/2022 5:39 PM  FoAlcohol abuser on call review [email or web address].

## 2022-05-31 NOTE — Hospital Course (Addendum)
57 year old man PMH alcohol abuse presented to the hospital with alcohol withdrawal after recent binge.  Admitted for alcohol withdrawal, acute kidney injury, anion gap metabolic acidosis.

## 2022-06-01 DIAGNOSIS — F10930 Alcohol use, unspecified with withdrawal, uncomplicated: Secondary | ICD-10-CM

## 2022-06-01 DIAGNOSIS — N179 Acute kidney failure, unspecified: Secondary | ICD-10-CM | POA: Diagnosis not present

## 2022-06-01 LAB — COMPREHENSIVE METABOLIC PANEL
ALT: 104 U/L — ABNORMAL HIGH (ref 0–44)
AST: 117 U/L — ABNORMAL HIGH (ref 15–41)
Albumin: 3.6 g/dL (ref 3.5–5.0)
Alkaline Phosphatase: 46 U/L (ref 38–126)
Anion gap: 9 (ref 5–15)
BUN: 22 mg/dL — ABNORMAL HIGH (ref 6–20)
CO2: 25 mmol/L (ref 22–32)
Calcium: 8.5 mg/dL — ABNORMAL LOW (ref 8.9–10.3)
Chloride: 97 mmol/L — ABNORMAL LOW (ref 98–111)
Creatinine, Ser: 1.09 mg/dL (ref 0.61–1.24)
GFR, Estimated: >60 mL/min (ref >60)
Glucose, Bld: 114 mg/dL — ABNORMAL HIGH (ref 70–99)
Potassium: 3.5 mmol/L (ref 3.5–5.1)
Sodium: 131 mmol/L — ABNORMAL LOW (ref 135–145)
Total Bilirubin: 1.7 mg/dL — ABNORMAL HIGH (ref 0.3–1.2)
Total Protein: 6.7 g/dL (ref 6.5–8.1)

## 2022-06-01 LAB — LIPASE, BLOOD: Lipase: 126 U/L — ABNORMAL HIGH (ref 11–51)

## 2022-06-01 LAB — CK: Total CK: 320 U/L (ref 49–397)

## 2022-06-01 MED ORDER — AMLODIPINE BESYLATE 10 MG PO TABS
10.0000 mg | ORAL_TABLET | Freq: Every day | ORAL | Status: DC
  Administered 2022-06-01 – 2022-06-02 (×2): 10 mg via ORAL
  Filled 2022-06-01 (×2): qty 1

## 2022-06-01 MED ORDER — ESCITALOPRAM OXALATE 20 MG PO TABS
20.0000 mg | ORAL_TABLET | Freq: Every day | ORAL | Status: DC
  Administered 2022-06-01 – 2022-06-02 (×2): 20 mg via ORAL
  Filled 2022-06-01 (×2): qty 1

## 2022-06-01 NOTE — TOC Initial Note (Signed)
Transition of Care [facility]) - Initial/Assessment Note    Patient Details  Name: Alex Logan MRN: [id number] Date of Birth: May 30, 1965  Transition of [facility]) CM/SW Contact:    Larrie Kass, LCSW Phone Number: 06/01/2022, 2:37 PM  Clinical Narrative:                 CSW spoke with pt, he reports going to rehab at [facility] twice over the past couple of years. He reports not drinking often, however, when he is down he tends to binge drink. Pt have agreed to substance use resources. CSW has attached resources to pt's AVS. Pt denies transportation needs at d/c no additional TOC needs TOC sign-off.   Expected Discharge Plan: Home/Self Care Barriers to Discharge: Continued Medical Work up   Patient Goals and CMS Choice Patient states their goals for this hospitalization and ongoing recovery are:: return home          Expected Discharge Plan and Services       Living arrangements for the past 2 months: Single Family Home                                      Prior Living Arrangements/Services Living arrangements for the past 2 months: Single Family Home Lives with:: Self Patient language and need for interpreter reviewed:: Yes Do you feel safe going back to the place where you live?: Yes      Need for Family Participation in Patient Care: No (Comment)     Criminal Activity/Legal Involvement Pertinent to Current Situation/Hospitalization: No - Comment as needed  Activities of Daily Living Home Assistive Devices/Equipment: None ADL Screening (condition at time of admission) Patient's cognitive ability adequate to safely complete daily activities?: Yes Is the patient deaf or have difficulty hearing?: No Does the patient have difficulty seeing, even when wearing glasses/contacts?: No Does the patient have difficulty concentrating, remembering, or making decisions?: No Patient able to express need for assistance with ADLs?: Yes Does the patient have  difficulty dressing or bathing?: No Independently performs ADLs?: Yes (appropriate for developmental age) Does the patient have difficulty walking or climbing stairs?: No Weakness of Legs: None Weakness of Arms/Hands: None  Permission Sought/Granted                  Emotional Assessment Appearance:: Appears stated age Attitude/Demeanor/Rapport: Gracious Affect (typically observed): Accepting Orientation: : Oriented to Self, Oriented to Place, Oriented to  Time, Oriented to Situation Alcohol / Substance Use: Alcohol Use Psych Involvement: No (comment)  Admission diagnosis:  Alcohol withdrawal (HCC) [F10.939] Alcohol withdrawal delirium, acute, hypoactive (HCC) [F10.231] Patient Active Problem List   Diagnosis Date Noted   Hiccups 05/30/2022   Alcohol withdrawal delirium, acute, hypoactive (HCC) 05/30/2022   AKI (acute kidney injury) (HCC) 02/17/2022   Thrombocytopenia (HCC) 08/11/2020   Tachycardia 01/14/2020   Elevated LFTs 05/27/2017   Muscular abdominal pain in right flank 05/27/2017   Fatty liver, alcoholic 05/27/2017   Depression with anxiety 05/27/2017   Pancytopenia (HCC) 05/27/2017   SVT (supraventricular tachycardia)    HTN (hypertension) 05/23/2017   Acute vomiting    Pressure injury of skin 11/12/2016   Alcohol withdrawal (HCC) 11/10/2016   SIRS (systemic inflammatory response syndrome) (HCC) 11/10/2016   High anion gap metabolic acidosis 11/10/2016   Lactic acidosis 11/10/2016   Hyponatremia 11/10/2016   Starvation ketoacidosis 11/10/2016   Alcohol use disorder, severe,  dependence (HCC) 09/27/2016   Alcohol abuse with alcohol-induced mood disorder (HCC) 09/22/2016   PCP:  [facility]:   [facility] [id number] - [place], [place] - [address] [place] [address] Phone: [phone] Fax: [phone]  [facility] [address] [place]  [address] Phone: [phone] Fax: [phone]     Social Determinants of Health (SDOH) Social History: SDOH Screenings   Food Insecurity: No Food Insecurity (05/30/2022)  Housing: Low Risk  (02/17/2022)  Transportation Needs: No Transportation Needs (05/30/2022)  Utilities: Not At Risk (05/30/2022)  Alcohol Screen: Medium Risk (11/12/2016)  Tobacco Use: Low Risk  (05/30/2022)   SDOH Interventions:     Readmission Risk Interventions     No data to display

## 2022-06-01 NOTE — Progress Notes (Signed)
  Progress Note   Patient: Alex Logan ZOX:[id number] DOB: [birth year] DOA: 05/30/2022     2 DOS: the patient was seen and examined on 06/01/2022   Brief hospital course: 57 year old man PMH alcohol abuse presented to the hospital with alcohol withdrawal after recent binge.  Admitted for alcohol withdrawal, acute kidney injury, anion gap metabolic acidosis.  Assessment and Plan: Alcohol withdrawal delirium, acute, hypoactive (HCC)/  Alcohol withdrawal (HCC) Alcohol abuse with transaminitis Vomiting Less withdrawal symptoms and Ativan use. Improving.  Continue present management.   Acute kidney injury Secondary to poor oral intake secondary to withdrawal.  Renal function improving with IV fluids, now WNL and BUN near normal   High anion gap metabolic acidosis Secondary to ethanol Resolved   Hypovolemic hyponatremia Much improved with hydration  Expect spontaneous resolution      Subjective:  Feels better No vomiting today Tolerating diet  Physical Exam: Vitals:   05/31/22 2112 05/31/22 2328 06/01/22 0513 06/01/22 1351  BP: 96/72 109/76 125/84 136/89  Pulse: 77 84 85 91  Resp: 16  16   Temp: 99.1 F (37.3 C)  99.3 F (37.4 C) (!) 97.5 F (36.4 C)  TempSrc: Oral  Oral Axillary  SpO2: 97%  96% 97%  Weight:      Height:       Physical Exam Vitals reviewed.  Constitutional:      General: He is not in acute distress.    Appearance: He is not ill-appearing or toxic-appearing.  Cardiovascular:     Rate and Rhythm: Normal rate and regular rhythm.     Heart sounds: No murmur heard. Pulmonary:     Effort: Pulmonary effort is normal. No respiratory distress.     Breath sounds: No wheezing, rhonchi or rales.  Musculoskeletal:     Comments: No upper extremity tremors  Neurological:     Mental Status: He is alert.  Psychiatric:        Mood and Affect: Mood normal.        Behavior: Behavior normal.     Data Reviewed: Na+ 131 Lipase 126 > no pain, tolerating  diet, insignificant, no further evaluation  Family Communication: none  Disposition: Status is: Inpatient Remains inpatient appropriate because: alcohol withdrawal  Planned Discharge Destination: Home    Time spent: 20 minutes  Author: Brendia Sacks, MD 06/01/2022 6:19 PM  For on call review [email or web address].

## 2022-06-02 ENCOUNTER — Other Ambulatory Visit (HOSPITAL_BASED_OUTPATIENT_CLINIC_OR_DEPARTMENT_OTHER): Payer: Self-pay

## 2022-06-02 DIAGNOSIS — N179 Acute kidney failure, unspecified: Secondary | ICD-10-CM | POA: Diagnosis not present

## 2022-06-02 DIAGNOSIS — F10930 Alcohol use, unspecified with withdrawal, uncomplicated: Secondary | ICD-10-CM | POA: Diagnosis not present

## 2022-06-02 LAB — COMPREHENSIVE METABOLIC PANEL
ALT: 119 U/L — ABNORMAL HIGH (ref 0–44)
AST: 102 U/L — ABNORMAL HIGH (ref 15–41)
Albumin: 3.1 g/dL — ABNORMAL LOW (ref 3.5–5.0)
Alkaline Phosphatase: 45 U/L (ref 38–126)
Anion gap: 7 (ref 5–15)
BUN: 18 mg/dL (ref 6–20)
CO2: 24 mmol/L (ref 22–32)
Calcium: 8.3 mg/dL — ABNORMAL LOW (ref 8.9–10.3)
Chloride: 100 mmol/L (ref 98–111)
Creatinine, Ser: 1.02 mg/dL (ref 0.61–1.24)
GFR, Estimated: >60 mL/min (ref >60)
Glucose, Bld: 111 mg/dL — ABNORMAL HIGH (ref 70–99)
Potassium: 4 mmol/L (ref 3.5–5.1)
Sodium: 131 mmol/L — ABNORMAL LOW (ref 135–145)
Total Bilirubin: 1 mg/dL (ref 0.3–1.2)
Total Protein: 6.4 g/dL — ABNORMAL LOW (ref 6.5–8.1)

## 2022-06-02 LAB — PROTIME-INR
INR: 0.9 (ref 0.8–1.2)
Prothrombin Time: 12.8 seconds (ref 11.4–15.2)

## 2022-06-02 MED ORDER — VITAMIN B-1 100 MG PO TABS: 100.0000 mg | ORAL_TABLET | Freq: Every day | ORAL | Status: DC

## 2022-06-02 MED ORDER — FOLIC ACID 1 MG PO TABS: 1.0000 mg | ORAL_TABLET | Freq: Every day | ORAL | Status: DC

## 2022-06-02 MED ORDER — CALCIUM CARBONATE ANTACID 500 MG PO CHEW: 1.0000 | CHEWABLE_TABLET | Freq: Two times a day (BID) | ORAL | Status: AC | PRN

## 2022-06-02 MED ORDER — PANTOPRAZOLE SODIUM 40 MG PO TBEC
40.0000 mg | DELAYED_RELEASE_TABLET | Freq: Two times a day (BID) | ORAL | 1 refills | Status: DC
  Filled 2022-06-02: qty 60, 30d supply, fill #0

## 2022-06-02 MED ORDER — PANTOPRAZOLE SODIUM 40 MG PO TBEC
40.0000 mg | DELAYED_RELEASE_TABLET | Freq: Every day | ORAL | 1 refills | Status: DC
  Filled 2022-06-02: qty 30, 30d supply, fill #0

## 2022-06-02 NOTE — Progress Notes (Signed)
Patient received discharge orders to go home. Patient was given discharge paperwork/instructions. RN went over discharge paperwork/instructions with the patient. All questions/concerns were answered and addressed. Patient left the hospital stable via [facility]. Patient also had discharge paperwork/instructions and had all personal belongings.

## 2022-06-02 NOTE — Discharge Summary (Signed)
Physician Discharge Summary   Patient: Alex Logan MRN: [id number] DOB: 1965-02-06  Admit date:     05/30/2022  Discharge date: 06/02/22  Discharge Physician: Brendia Sacks   PCP: [facility]   Recommendations at discharge:   Encourage abstinence from alcohol Follow-up with psychiatrist to discuss antidepressant  Discharge Diagnoses: Principal Problem:   Alcohol withdrawal (HCC) Active Problems:   Alcohol abuse with alcohol-induced mood disorder (HCC)   High anion gap metabolic acidosis   Hyponatremia   Acute vomiting   AKI (acute kidney injury) (HCC)   Hiccups   Alcohol withdrawal delirium, acute, hypoactive (HCC)  Resolved Problems:   * No resolved hospital problems. *  Hospital Course: 57 year old man PMH alcohol abuse presented to the hospital with alcohol withdrawal after recent binge.  Admitted for alcohol withdrawal, acute kidney injury, anion gap metabolic acidosis.  Treated for alcohol withdrawal with gradual clinical improvement.  Acute kidney and injury resolved with IV fluids.  Discharged home in good condition.  Alcohol withdrawal delirium, acute, hypoactive (HCC)/  Alcohol withdrawal (HCC) Alcohol abuse with transaminitis Vomiting Lipase trivial elevation, tolerating diet Acute withdrawal appears resolved. Seen by TOC. Pt agreed to substance use resources. CSW attached resources to pt's AVS.    Acute kidney injury Secondary to poor oral intake secondary to withdrawal.   Renal function has normalized.   High anion gap metabolic acidosis Secondary to ethanol Resolved   Hypovolemic hyponatremia Much improved with hydration  Expect spontaneous resolution       Consultants:  None   Procedures performed:  None    Disposition: Home Diet recommendation:  Regular diet DISCHARGE MEDICATION: Allergies as of 06/02/2022   No Known Allergies      Medication List     TAKE these medications    amLODipine 10 MG tablet Commonly  known as: NORVASC Take 1 tablet (10 mg total) by mouth daily.   calcium carbonate 500 MG chewable tablet Commonly known as: Tums Chew 1 tablet (200 mg of elemental calcium total) by mouth 2 (two) times daily as needed for indigestion or heartburn.   escitalopram 20 MG tablet Commonly known as: LEXAPRO Take 20 mg by mouth daily.   folic acid 1 MG tablet Commonly known as: FOLVITE Take 1 tablet (1 mg total) by mouth daily.   gabapentin 300 MG capsule Commonly known as: NEURONTIN Take 1 capsule (300 mg total) by mouth 3 (three) times daily for 10 days.   lisinopril 20 MG tablet Commonly known as: ZESTRIL Take 20 mg by mouth daily.   multivitamin with minerals Tabs tablet Take 1 tablet by mouth daily.   pantoprazole 40 MG tablet Commonly known as: PROTONIX Take 1 tablet (40 mg total) by mouth 2 (two) times daily before a meal. What changed: when to take this   propranolol ER 80 MG 24 hr capsule Commonly known as: INDERAL LA Take 80 mg by mouth daily.   thiamine 100 MG tablet Commonly known as: Vitamin B-1 Take 1 tablet (100 mg total) by mouth daily.        Follow-up Information     Center, [facility]. Schedule an appointment as soon as possible for a visit in 1 week(s).   Contact information: [address] [place] [address] [phone]                Feels better Tolerating diet Discharge Exam: Filed Weights   05/30/22 1018  Weight: 81.6 kg   Physical Exam Vitals reviewed.  Constitutional:  General: He is not in acute distress.    Appearance: He is not ill-appearing or toxic-appearing.  Cardiovascular:     Rate and Rhythm: Normal rate and regular rhythm.     Heart sounds: No murmur heard. Pulmonary:     Effort: Pulmonary effort is normal. No respiratory distress.     Breath sounds: No wheezing, rhonchi or rales.  Musculoskeletal:     Comments: No tremors  Neurological:     Mental Status: He is alert.  Psychiatric:         Mood and Affect: Mood normal.        Behavior: Behavior normal.      Condition at discharge: good  The results of significant diagnostics from this hospitalization (including imaging, microbiology, ancillary and laboratory) are listed below for reference.   Imaging Studies: DG Chest Portable 1 View  Result Date: 05/30/2022 CLINICAL DATA:  Shortness of breath. EXAM: PORTABLE CHEST 1 VIEW COMPARISON:  11/10/2016. FINDINGS: Clear lungs. Normal heart size and mediastinal contours. No pleural effusion or pneumothorax. Visualized bones and upper abdomen are unremarkable. IMPRESSION: No evidence of acute cardiopulmonary disease. Electronically Signed   By: Orvan Falconer M.D.   On: 05/30/2022 11:09   CT Head Wo Contrast  Result Date: 05/30/2022 CLINICAL DATA:  Headache, new onset. EXAM: CT HEAD WITHOUT CONTRAST TECHNIQUE: Contiguous axial images were obtained from the base of the skull through the vertex without intravenous contrast. RADIATION DOSE REDUCTION: This exam was performed according to the departmental dose-optimization program which includes automated exposure control, adjustment of the mA and/or kV according to patient size and/or use of iterative reconstruction technique. COMPARISON:  Head CT 12/06/2012.  MRI brain 12/06/2012. FINDINGS: Brain: No acute intracranial hemorrhage. Gray-white differentiation is preserved. No hydrocephalus or extra-axial collection. No mass effect or midline shift. Vascular: No hyperdense vessel or unexpected calcification. Skull: No calvarial fracture or suspicious bone lesion. Skull base is unremarkable. Sinuses/Orbits: Paranasal sinuses are well aerated. Chronic defect in the right orbital floor. Other: None. IMPRESSION: No acute intracranial abnormality. Electronically Signed   By: Orvan Falconer M.D.   On: 05/30/2022 11:03    Microbiology: Results for orders placed or performed during the hospital encounter of 08/10/20  SARS CORONAVIRUS 2 (TAT 6-24 HRS)  Nasopharyngeal Nasopharyngeal Swab     Status: None   Collection Time: 08/10/20  9:45 AM   Specimen: Nasopharyngeal Swab  Result Value Ref Range Status   SARS Coronavirus 2 NEGATIVE NEGATIVE Final    Comment: (NOTE) SARS-CoV-2 target nucleic acids are NOT DETECTED.  The SARS-CoV-2 RNA is generally detectable in upper and lower respiratory specimens during the acute phase of infection. Negative results do not preclude SARS-CoV-2 infection, do not rule out co-infections with other pathogens, and should not be used as the sole basis for treatment or other patient management decisions. Negative results must be combined with clinical observations, patient history, and epidemiological information. The expected result is Negative.  Fact Sheet for Patients: HairSlick.no  Fact Sheet for Healthcare Providers: quierodirigir.com  This test is not yet approved or cleared by the [place] FDA and  has been authorized for detection and/or diagnosis of SARS-CoV-2 by FDA under an Emergency Use Authorization (EUA). This EUA will remain  in effect (meaning this test can be used) for the duration of the COVID-19 declaration under Se ction 564(b)(1) of the Act, 21 U.S.C. section 360bbb-3(b)(1), unless the authorization is terminated or revoked sooner.  Performed at [facility], [address] [place],  [place] [address]     Labs: CBC: Recent Labs  Lab 05/30/22 1022 05/30/22 1213 05/31/22 0435  WBC 14.5*  --  10.9*  HGB 15.5 14.3 14.8  HCT 43.0 42.0 42.5  MCV 86.5  --  92.0  PLT 424*  --  319   Basic Metabolic Panel: Recent Labs  Lab 05/30/22 1029 05/30/22 1213 05/30/22 1752 05/31/22 0435 06/01/22 0857 06/02/22 1007  NA 128* 123*  --  130* 131* 131*  K 4.1 4.1  --  3.5 3.5 4.0  CL 78*  --   --  89* 97* 100  CO2 22  --   --  30 25 24   GLUCOSE 133*  --   --  120* 114* 111*  BUN 50*  --   --  44* 22* 18  CREATININE  3.80*  --   --  2.02* 1.09 1.02  CALCIUM 9.5  --   --  8.6* 8.5* 8.3*  MG  --   --  1.9  --   --   --    Liver Function Tests: Recent Labs  Lab 05/30/22 1029 05/31/22 0435 06/01/22 0857 06/02/22 1007  AST 66* 102* 117* 102*  ALT 43 67* 104* 119*  ALKPHOS 56 50 46 45  BILITOT 2.4* 2.0* 1.7* 1.0  PROT 7.7 7.0 6.7 6.4*  ALBUMIN 4.8 3.8 3.6 3.1*   CBG: No results for input(s): "GLUCAP" in the last 168 hours.  Discharge time spent: less than 30 minutes.  Signed: Brendia Sacks, MD [facility] 06/02/2022

## 2022-06-02 NOTE — Plan of Care (Signed)

## 2022-06-06 ENCOUNTER — Other Ambulatory Visit (HOSPITAL_BASED_OUTPATIENT_CLINIC_OR_DEPARTMENT_OTHER): Payer: Self-pay

## 2022-08-01 ENCOUNTER — Other Ambulatory Visit: Payer: Self-pay | Admitting: Orthopedic Surgery

## 2022-08-01 DIAGNOSIS — M25311 Other instability, right shoulder: Secondary | ICD-10-CM

## 2022-08-04 IMAGING — DX DG SHOULDER 2+V*R*
1 series · 1 of 1 positions shown · non-contrast
Comparison: Earlier same day

CLINICAL DATA: Status post reduction.

EXAM:
RIGHT SHOULDER - 2+ VIEW

[shoulder ap]
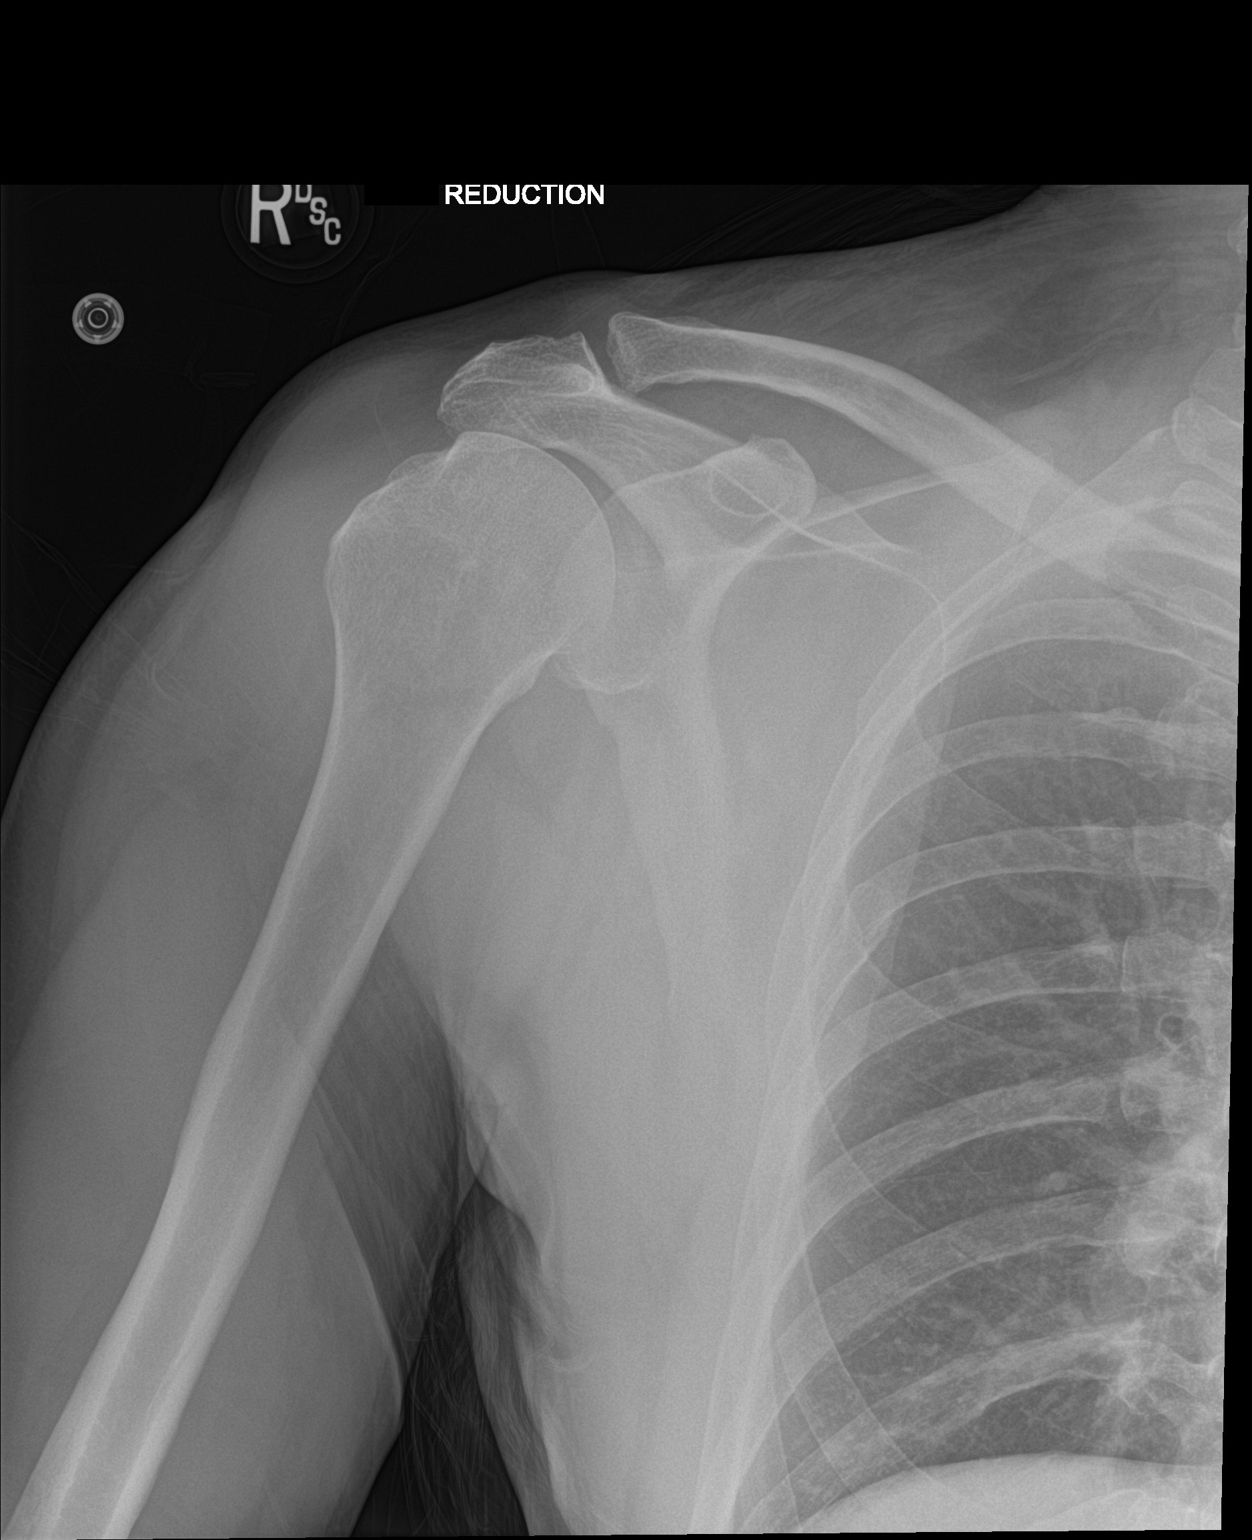

[1 of 1 positions shown; findings below may reference images not displayed]

FINDINGS: Single frontal view of the right shoulder shows interval reduction
of the humeral head dislocation. Apparent Hill-Sachs deformity
evident. Old posterior right rib fracture noted.
IMPRESSION: Interval reduction of the right humeral head dislocation.

## 2022-08-04 IMAGING — CR DG SHOULDER 1V*R*
1 series · 1 of 1 positions shown · non-contrast
Comparison: 07/21/2020.

CLINICAL DATA: 55-year-old male with recurrent right shoulder
dislocation.

EXAM:
RIGHT SHOULDER - 1 VIEW

[w shoulder external right]
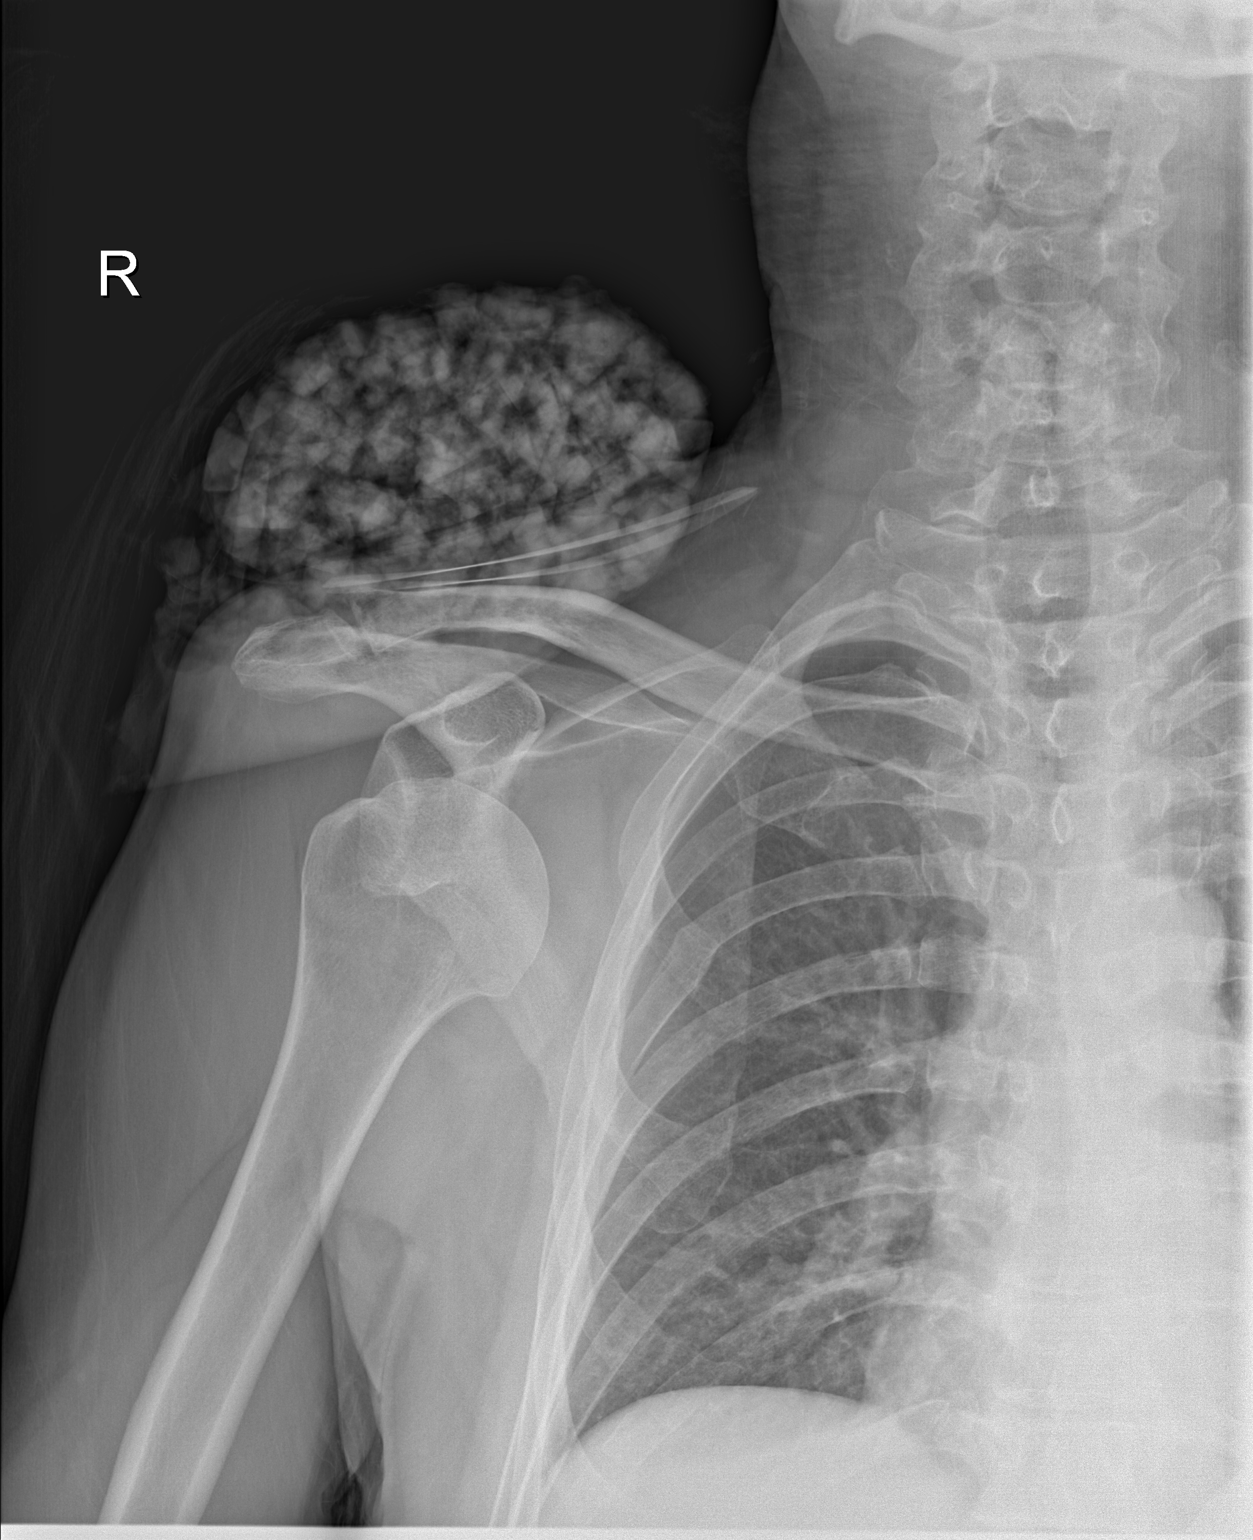

[1 of 1 positions shown; findings below may reference images not displayed]

FINDINGS: Single-view demonstrating abnormal alignment of the right humeral
head and glenoid, likely and anterior, subcoracoid dislocation. No
acute fracture identified. Ice bag resting on the right AC joint
region. Chronic right 5th rib fracture. Negative visible right
chest.
IMPRESSION: Positive for right glenohumeral dislocation, probably anterior -
subcoracoid based on this single view.

No acute fracture identified.

## 2022-08-20 ENCOUNTER — Encounter (HOSPITAL_BASED_OUTPATIENT_CLINIC_OR_DEPARTMENT_OTHER): Payer: Self-pay

## 2022-08-20 ENCOUNTER — Emergency Department (HOSPITAL_BASED_OUTPATIENT_CLINIC_OR_DEPARTMENT_OTHER)
Admission: EM | Admit: 2022-08-20 | Discharge: 2022-08-20 | Disposition: A | Discharge status: Home / Self Care | Payer: BC Managed Care – PPO | Attending: Emergency Medicine | Admitting: Emergency Medicine

## 2022-08-20 DIAGNOSIS — F10930 Alcohol use, unspecified with withdrawal, uncomplicated: Secondary | ICD-10-CM

## 2022-08-20 DIAGNOSIS — F10230 Alcohol dependence with withdrawal, uncomplicated: Secondary | ICD-10-CM | POA: Insufficient documentation

## 2022-08-20 DIAGNOSIS — F10239 Alcohol dependence with withdrawal, unspecified: Secondary | ICD-10-CM | POA: Diagnosis not present

## 2022-08-20 DIAGNOSIS — Y906 Blood alcohol level of 120-199 mg/100 ml: Secondary | ICD-10-CM | POA: Diagnosis not present

## 2022-08-20 LAB — COMPREHENSIVE METABOLIC PANEL
ALT: 20 U/L (ref 0–44)
AST: 25 U/L (ref 15–41)
Albumin: 4.2 g/dL (ref 3.5–5.0)
Alkaline Phosphatase: 45 U/L (ref 38–126)
Anion gap: 12 (ref 5–15)
BUN: 15 mg/dL (ref 6–20)
CO2: 26 mmol/L (ref 22–32)
Calcium: 8.8 mg/dL — ABNORMAL LOW (ref 8.9–10.3)
Chloride: 100 mmol/L (ref 98–111)
Creatinine, Ser: 0.96 mg/dL (ref 0.61–1.24)
GFR, Estimated: >60 mL/min (ref >60)
Glucose, Bld: 98 mg/dL (ref 70–99)
Potassium: 3.5 mmol/L (ref 3.5–5.1)
Sodium: 138 mmol/L (ref 135–145)
Total Bilirubin: 0.9 mg/dL (ref 0.3–1.2)
Total Protein: 6.5 g/dL (ref 6.5–8.1)

## 2022-08-20 LAB — CBC WITH DIFFERENTIAL/PLATELET
Abs Immature Granulocytes: 0.02 10*3/uL (ref 0.00–0.07)
Basophils Absolute: 0.1 10*3/uL (ref 0.0–0.1)
Basophils Relative: 1 %
Eosinophils Absolute: 0 10*3/uL (ref 0.0–0.5)
Eosinophils Relative: 0 %
HCT: 38.5 % — ABNORMAL LOW (ref 39.0–52.0)
Hemoglobin: 13.9 g/dL (ref 13.0–17.0)
Immature Granulocytes: 0 %
Lymphocytes Relative: 25 %
Lymphs Abs: 1.6 10*3/uL (ref 0.7–4.0)
MCH: 31.2 pg (ref 26.0–34.0)
MCHC: 36.1 g/dL — ABNORMAL HIGH (ref 30.0–36.0)
MCV: 86.5 fL (ref 80.0–100.0)
Monocytes Absolute: 0.7 10*3/uL (ref 0.1–1.0)
Monocytes Relative: 11 %
Neutro Abs: 3.9 10*3/uL (ref 1.7–7.7)
Neutrophils Relative %: 63 %
Platelets: 314 10*3/uL (ref 150–400)
RBC: 4.45 MIL/uL (ref 4.22–5.81)
RDW: 12.4 % (ref 11.5–15.5)
WBC: 6.3 10*3/uL (ref 4.0–10.5)
nRBC: 0 % (ref 0.0–0.2)

## 2022-08-20 LAB — LIPASE, BLOOD: Lipase: 43 U/L (ref 11–51)

## 2022-08-20 LAB — ETHANOL: Alcohol, Ethyl (B): 143 mg/dL — ABNORMAL HIGH (ref <10)

## 2022-08-20 MED ORDER — ONDANSETRON HCL 4 MG/2ML IJ SOLN
4.0000 mg | Freq: Once | INTRAMUSCULAR | Status: AC
  Administered 2022-08-20: 4 mg via INTRAVENOUS
  Filled 2022-08-20: qty 2

## 2022-08-20 MED ORDER — VITAMIN B-1 100 MG PO TABS: 100.0000 mg | ORAL_TABLET | Freq: Every day | ORAL | Status: DC

## 2022-08-20 MED ORDER — FAMOTIDINE IN NACL 20-0.9 MG/50ML-% IV SOLN
20.0000 mg | Freq: Once | INTRAVENOUS | Status: AC
  Administered 2022-08-20: 20 mg via INTRAVENOUS
  Filled 2022-08-20: qty 50

## 2022-08-20 MED ORDER — THIAMINE HCL 100 MG PO TABS: 100.0000 mg | ORAL_TABLET | Freq: Every day | ORAL | 0 refills | Status: DC

## 2022-08-20 MED ORDER — FOLIC ACID 1 MG PO TABS: 1.0000 mg | ORAL_TABLET | Freq: Every day | ORAL | Status: DC

## 2022-08-20 MED ORDER — SODIUM CHLORIDE 0.9 % IV BOLUS
1000.0000 mL | Freq: Once | INTRAVENOUS | Status: AC
  Administered 2022-08-20: 1000 mL via INTRAVENOUS

## 2022-08-20 MED ORDER — FOLIC ACID 1 MG PO TABS
1.0000 mg | ORAL_TABLET | Freq: Once | ORAL | Status: AC
  Administered 2022-08-20: 1 mg via ORAL
  Filled 2022-08-20: qty 1

## 2022-08-20 MED ORDER — THIAMINE MONONITRATE 100 MG PO TABS
100.0000 mg | ORAL_TABLET | Freq: Once | ORAL | Status: AC
  Administered 2022-08-20: 100 mg via ORAL
  Filled 2022-08-20: qty 1

## 2022-08-20 MED ORDER — LORAZEPAM 1 MG PO TABS
2.0000 mg | ORAL_TABLET | Freq: Once | ORAL | Status: AC
  Administered 2022-08-20: 2 mg via ORAL
  Filled 2022-08-20: qty 2

## 2022-08-20 MED ORDER — LORAZEPAM 2 MG/ML IJ SOLN
2.0000 mg | Freq: Once | INTRAMUSCULAR | Status: AC
  Administered 2022-08-20: 2 mg via INTRAVENOUS
  Filled 2022-08-20: qty 1

## 2022-08-20 MED ORDER — CHLORDIAZEPOXIDE HCL 25 MG PO CAPS: ORAL_CAPSULE | ORAL | 0 refills | Status: DC

## 2022-08-20 MED ORDER — FOLIC ACID 1 MG PO TABS: 1.0000 mg | ORAL_TABLET | Freq: Every day | ORAL | 0 refills | Status: DC

## 2022-08-20 NOTE — ED Triage Notes (Signed)
Pt states he "needs to detox"- last drink 6a, reports fifth of fireball consumed in last 3 days. Pt states he blew 0.19 at home, "hx of the same thing so I know where this is going."

## 2022-08-20 NOTE — Discharge Instructions (Addendum)
You have been seen and discharged from the emergency department.  Take Librium and taper as prescribed.  Stable hydrated.  Continue to take thiamine and folate daily.  Avoid any alcohol use.  Follow-up with your primary provider for further evaluation and further care. Take home medications as prescribed. If you have any worsening symptoms, nausea/vomiting, severe tremors, worsening withdrawal or further concerns for your health please return to an emergency department for further evaluation.

## 2022-08-20 NOTE — ED Notes (Addendum)
Discharge paperwork given and verbally understood.Alex Logan Pt aware and understood no driving/drinking due to the meds that were given... Pt stated that he would uber.Alex KitchenMarland Logan

## 2022-08-20 NOTE — ED Provider Notes (Signed)
[place] EMERGENCY DEPARTMENT AT [facility] Provider Note   CSN: [id number] Arrival date & time: 08/20/22  1404     History  No chief complaint on file.   Alex Logan is a 57 y.o. male.  HPI   61 male with past medical history of alcohol abuse presents emergency department with concern for alcohol withdrawal.  Patient states he goes through episodes of sobriety and alcohol abuse/dependence.  He has been drinking daily for the past couple weeks, reports that he drinks about 1/5 of alcohol a day.  He wants to sober up and stay sober.  His last drink was around 6 AM this morning.  He is starting to feel tremulous, have nausea.  He has history of hallucinations in the past but no documented seizure activity.  Patient has done inpatient and outpatient detox in the past.  Denies any fever, diarrhea, chest pain.  Home Medications Prior to Admission medications   Medication Sig Start Date End Date Taking? Authorizing Provider  amLODipine (NORVASC) 10 MG tablet Take 1 tablet (10 mg total) by mouth daily. 08/13/20   Lewie Chamber, MD  calcium carbonate (TUMS) 500 MG chewable tablet Chew 1 tablet (200 mg of elemental calcium total) by mouth 2 (two) times daily as needed for indigestion or heartburn. 06/02/22 06/02/23  Standley Brooking, MD  escitalopram (LEXAPRO) 20 MG tablet Take 20 mg by mouth daily. 11/08/20   [provider]  folic acid (FOLVITE) 1 MG tablet Take 1 tablet (1 mg total) by mouth daily. 06/02/22 09/10/22  Standley Brooking, MD  gabapentin (NEURONTIN) 300 MG capsule Take 1 capsule (300 mg total) by mouth 3 (three) times daily for 10 days. 02/19/22 03/01/22  Pokhrel, Rebekah Chesterfield, MD  lisinopril (ZESTRIL) 20 MG tablet Take 20 mg by mouth daily. 01/25/22   [provider]  Multiple Vitamin (MULTIVITAMIN WITH MINERALS) TABS tablet Take 1 tablet by mouth daily. Patient not taking: Reported on 05/30/2022 01/17/20   Lewie Chamber, MD  pantoprazole (PROTONIX) 40 MG  tablet Take 1 tablet (40 mg total) by mouth 2 (two) times daily before a meal. 06/02/22   Standley Brooking, MD  propranolol ER (INDERAL LA) 80 MG 24 hr capsule Take 80 mg by mouth daily. 10/18/20   [provider]  thiamine (VITAMIN B-1) 100 MG tablet Take 1 tablet (100 mg total) by mouth daily. 06/02/22 09/10/22  Standley Brooking, MD      Allergies    Patient has no known allergies.    Review of Systems   Review of Systems  Constitutional:  Positive for appetite change and fatigue. Negative for fever.  Respiratory:  Negative for shortness of breath.   Cardiovascular:  Negative for chest pain.  Gastrointestinal:  Positive for nausea and vomiting. Negative for abdominal pain and diarrhea.  Skin:  Negative for rash.  Neurological:  Positive for tremors. Negative for headaches.  Psychiatric/Behavioral:  Positive for agitation, hallucinations and sleep disturbance. The patient is nervous/anxious.     Physical Exam Updated Vital Signs BP (!) 184/118 (BP Location: Right Arm)   Pulse 98   Temp 98.8 F (37.1 C)   Resp 16   SpO2 98%  Physical Exam Vitals and nursing note reviewed.  Constitutional:      General: He is not in acute distress.    Appearance: Normal appearance.     Comments: Tremulous, tearful at times  HENT:     Head: Normocephalic.     Mouth/Throat:  Mouth: Mucous membranes are moist.  Eyes:     Pupils: Pupils are equal, round, and reactive to light.  Cardiovascular:     Rate and Rhythm: Normal rate.  Pulmonary:     Effort: Pulmonary effort is normal. No respiratory distress.     Breath sounds: Normal breath sounds.  Abdominal:     Palpations: Abdomen is soft.     Tenderness: There is no abdominal tenderness. There is no guarding or rebound.  Skin:    General: Skin is warm.  Neurological:     Mental Status: He is alert and oriented to person, place, and time. Mental status is at baseline.  Psychiatric:        Mood and Affect: Mood normal.      ED Results / Procedures / Treatments   Labs (all labs ordered are listed, but only abnormal results are displayed) Labs Reviewed  CBC WITH DIFFERENTIAL/PLATELET  COMPREHENSIVE METABOLIC PANEL  LIPASE, BLOOD  ETHANOL    EKG None  Radiology No results found.  Procedures Procedures    Medications Ordered in ED Medications  sodium chloride 0.9 % bolus 1,000 mL (has no administration in time range)  famotidine (PEPCID) IVPB 20 mg premix (has no administration in time range)  ondansetron (ZOFRAN) injection 4 mg (has no administration in time range)  LORazepam (ATIVAN) injection 2 mg (has no administration in time range)    ED Course/ Medical Decision Making/ A&P                                 Medical Decision Making Amount and/or Complexity of Data Reviewed Labs: ordered.  Risk OTC drugs. Prescription drug management.   57 year old male presents the emergency department with concern for alcohol withdrawal.  Last drink was 6 AM this morning.  He has had intermittent episodes of sobriety then relapses into daily alcohol abuse.  He is here seeking detox.  He complains of tremors, nausea/vomiting but denies any hallucinations or seizure-like activity.  No history of withdrawal seizures.  Patient was hypertensive and slightly tachycardic on arrival.  Had active vomiting on my initial evaluation.  Blood work is reassuring, no acute abnormalities.  After IV medicine he feels improved.  He has been able to eat/drink and take oral medications.  He seems suitable for outpatient detox with Librium taper which she has success with in the past.  I will send this medication to 24/7 pharmacy to ensure that he gets his medication tonight.  He offers no other complaints and appears well.  Patient at this time appears safe and stable for discharge and close outpatient follow up. Discharge plan and strict return to ED precautions discussed, patient verbalizes understanding and  agreement.        Final Clinical Impression(s) / ED Diagnoses Final diagnoses:  None    Rx / DC Orders ED Discharge Orders     None         Alex Logan, DO 08/20/22 1850

## 2022-08-20 NOTE — ED Notes (Signed)
Gave patient peanut butter crackers, trail mix, and a ginger ale. Patient is sitting up in bed and states he is feeling much better.

## 2022-08-20 NOTE — ED Notes (Signed)
CIWA performed by RN in triage.Marland KitchenMarland Kitchen

## 2022-08-22 ENCOUNTER — Encounter (HOSPITAL_BASED_OUTPATIENT_CLINIC_OR_DEPARTMENT_OTHER): Payer: Self-pay | Admitting: Emergency Medicine

## 2022-08-22 ENCOUNTER — Emergency Department (HOSPITAL_BASED_OUTPATIENT_CLINIC_OR_DEPARTMENT_OTHER)
Admission: EM | Admit: 2022-08-22 | Discharge: 2022-08-22 | Disposition: A | Discharge status: Home / Self Care | Payer: BC Managed Care – PPO | Attending: Emergency Medicine | Admitting: Emergency Medicine

## 2022-08-22 ENCOUNTER — Emergency Department (HOSPITAL_BASED_OUTPATIENT_CLINIC_OR_DEPARTMENT_OTHER): Payer: BC Managed Care – PPO

## 2022-08-22 ENCOUNTER — Other Ambulatory Visit: Payer: Self-pay

## 2022-08-22 ENCOUNTER — Emergency Department (HOSPITAL_BASED_OUTPATIENT_CLINIC_OR_DEPARTMENT_OTHER): Payer: BC Managed Care – PPO | Admitting: Radiology

## 2022-08-22 DIAGNOSIS — I10 Essential (primary) hypertension: Secondary | ICD-10-CM | POA: Diagnosis not present

## 2022-08-22 DIAGNOSIS — M19011 Primary osteoarthritis, right shoulder: Secondary | ICD-10-CM | POA: Diagnosis not present

## 2022-08-22 DIAGNOSIS — S2231XA Fracture of one rib, right side, initial encounter for closed fracture: Secondary | ICD-10-CM | POA: Diagnosis not present

## 2022-08-22 DIAGNOSIS — Z79899 Other long term (current) drug therapy: Secondary | ICD-10-CM | POA: Diagnosis not present

## 2022-08-22 DIAGNOSIS — X501XXA Overexertion from prolonged static or awkward postures, initial encounter: Secondary | ICD-10-CM | POA: Diagnosis not present

## 2022-08-22 DIAGNOSIS — S43014A Anterior dislocation of right humerus, initial encounter: Secondary | ICD-10-CM | POA: Diagnosis not present

## 2022-08-22 DIAGNOSIS — S2241XA Multiple fractures of ribs, right side, initial encounter for closed fracture: Secondary | ICD-10-CM | POA: Diagnosis not present

## 2022-08-22 DIAGNOSIS — S4991XA Unspecified injury of right shoulder and upper arm, initial encounter: Secondary | ICD-10-CM | POA: Diagnosis not present

## 2022-08-22 DIAGNOSIS — S43004A Unspecified dislocation of right shoulder joint, initial encounter: Secondary | ICD-10-CM | POA: Diagnosis not present

## 2022-08-22 MED ORDER — PROPOFOL 10 MG/ML IV BOLUS
20.0000 mg | Freq: Once | INTRAVENOUS | Status: AC
  Administered 2022-08-22: 20 mg via INTRAVENOUS

## 2022-08-22 MED ORDER — PROPOFOL 10 MG/ML IV BOLUS
40.0000 mg | Freq: Once | INTRAVENOUS | Status: AC
  Administered 2022-08-22: 40 mg via INTRAVENOUS

## 2022-08-22 MED ORDER — PROPOFOL 10 MG/ML IV BOLUS
0.5000 mg/kg | Freq: Once | INTRAVENOUS | Status: AC
  Administered 2022-08-22: 41 mg via INTRAVENOUS
  Filled 2022-08-22: qty 20

## 2022-08-22 MED ORDER — ONDANSETRON HCL 4 MG/2ML IJ SOLN
4.0000 mg | Freq: Once | INTRAMUSCULAR | Status: AC
  Administered 2022-08-22: 4 mg via INTRAVENOUS
  Filled 2022-08-22: qty 2

## 2022-08-22 MED ORDER — BUPIVACAINE HCL (PF) 0.5 % IJ SOLN
20.0000 mL | Freq: Once | INTRAMUSCULAR | Status: AC
  Administered 2022-08-22: 20 mL
  Filled 2022-08-22: qty 20

## 2022-08-22 MED ORDER — FENTANYL CITRATE PF 50 MCG/ML IJ SOSY
50.0000 ug | PREFILLED_SYRINGE | INTRAMUSCULAR | Status: AC | PRN
  Administered 2022-08-22: 50 ug via INTRAVENOUS
  Filled 2022-08-22: qty 1

## 2022-08-22 MED ORDER — PROPOFOL 10 MG/ML IV BOLUS
60.0000 mg | Freq: Once | INTRAVENOUS | Status: AC
  Administered 2022-08-22: 60 mg via INTRAVENOUS

## 2022-08-22 MED ORDER — FENTANYL CITRATE PF 50 MCG/ML IJ SOSY
50.0000 ug | PREFILLED_SYRINGE | Freq: Once | INTRAMUSCULAR | Status: AC
  Administered 2022-08-22: 50 ug via INTRAVENOUS
  Filled 2022-08-22: qty 1

## 2022-08-22 MED ORDER — SODIUM CHLORIDE 0.9 % IV BOLUS
1000.0000 mL | Freq: Once | INTRAVENOUS | Status: AC
  Administered 2022-08-22: 1000 mL via INTRAVENOUS

## 2022-08-22 NOTE — Discharge Instructions (Addendum)
Follow-up outpatient with orthopedics, maintain a shoulder sling for comfort for the next week.

## 2022-08-22 NOTE — ED Triage Notes (Signed)
Pt c/o right shoulder dislocation. States this happens frequently, is supposed to go for an MRI this AM. Shoulder popped out of place when he was getting up out of bed.

## 2022-08-22 NOTE — ED Provider Notes (Signed)
[place] EMERGENCY DEPARTMENT AT [facility] Provider Note   CSN: [id number] Arrival date & time: 08/22/22  0422     History  Chief Complaint  Patient presents with   Shoulder Injury    Alex Logan is a 57 y.o. male.   Shoulder Injury     57 year old male with medical history significant for alcohol abuse, depression, anxiety, hypertension, recurrent shoulder dislocations who presents to the emergency department with concern for a shoulder dislocation.  The patient states that he was getting up out of bed and reaching and his shoulder dislocated prior to arrival this morning.  He states that he follows outpatient with orthopedics and is scheduled to get an MRI and eventually will have shoulder surgery.  Home Medications Prior to Admission medications   Medication Sig Start Date End Date Taking? Authorizing Provider  amLODipine (NORVASC) 10 MG tablet Take 1 tablet (10 mg total) by mouth daily. 08/13/20   Lewie Chamber, MD  calcium carbonate (TUMS) 500 MG chewable tablet Chew 1 tablet (200 mg of elemental calcium total) by mouth 2 (two) times daily as needed for indigestion or heartburn. 06/02/22 06/02/23  Standley Brooking, MD  chlordiazePOXIDE (LIBRIUM) 25 MG capsule 50mg  PO TID x 1D, then 25-50mg  PO BID X 1D, then 25-50mg  PO QD X 1D 08/20/22   Horton, Kristie M, DO  escitalopram (LEXAPRO) 20 MG tablet Take 20 mg by mouth daily. 11/08/20   [provider]  folic acid (FOLVITE) 1 MG tablet Take 1 tablet (1 mg total) by mouth daily. 08/20/22 11/28/22  Horton, Clabe Seal, DO  folic acid (FOLVITE) 1 MG tablet Take 1 tablet (1 mg total) by mouth daily. 08/20/22   Horton, Clabe Seal, DO  gabapentin (NEURONTIN) 300 MG capsule Take 1 capsule (300 mg total) by mouth 3 (three) times daily for 10 days. 02/19/22 03/01/22  Pokhrel, Rebekah Chesterfield, MD  lisinopril (ZESTRIL) 20 MG tablet Take 20 mg by mouth daily. 01/25/22   [provider]  Multiple Vitamin (MULTIVITAMIN WITH  MINERALS) TABS tablet Take 1 tablet by mouth daily. Patient not taking: Reported on 05/30/2022 01/17/20   Lewie Chamber, MD  pantoprazole (PROTONIX) 40 MG tablet Take 1 tablet (40 mg total) by mouth 2 (two) times daily before a meal. 06/02/22   Standley Brooking, MD  propranolol ER (INDERAL LA) 80 MG 24 hr capsule Take 80 mg by mouth daily. 10/18/20   [provider]  thiamine (VITAMIN B-1) 100 MG tablet Take 1 tablet (100 mg total) by mouth daily. 08/20/22 11/28/22  Horton, Clabe Seal, DO  thiamine (VITAMIN B1) 100 MG tablet Take 1 tablet (100 mg total) by mouth daily. 08/20/22   Horton, Clabe Seal, DO      Allergies    Patient has no known allergies.    Review of Systems   Review of Systems  All other systems reviewed and are negative.   Physical Exam Updated Vital Signs BP (!) 159/112   Pulse 88   Temp 98.4 F (36.9 C)   Resp (!) 25   Ht 5\' 10"  (1.778 m)   Wt 82 kg   SpO2 100%   BMI 25.94 kg/m  Physical Exam Vitals and nursing note reviewed.  Constitutional:      General: He is not in acute distress. HENT:     Head: Normocephalic and atraumatic.  Eyes:     Conjunctiva/sclera: Conjunctivae normal.     Pupils: Pupils are equal, round, and reactive to light.  Cardiovascular:  Rate and Rhythm: Normal rate and regular rhythm.  Pulmonary:     Effort: Pulmonary effort is normal. No respiratory distress.  Abdominal:     General: There is no distension.     Tenderness: There is no guarding.  Musculoskeletal:        General: No deformity or signs of injury.     Cervical back: Neck supple.     Comments: Right arm neurovascularly intact, 2+ radial pulses, intact motor function along the median, ulnar, radial nerve distributions.  Right shoulder appears to be dislocated, humeral head can be palpated anteriorly  Skin:    Findings: No lesion or rash.  Neurological:     General: No focal deficit present.     Mental Status: He is alert. Mental status is at baseline.      ED Results / Procedures / Treatments   Labs (all labs ordered are listed, but only abnormal results are displayed) Labs Reviewed - No data to display  EKG None  Radiology DG Shoulder Right  Result Date: 08/22/2022 CLINICAL DATA:  Shoulder injury. EXAM: RIGHT SHOULDER - 2 VIEW COMPARISON:  04/25/2022 FINDINGS: Anterior glenohumeral dislocation without acute fracture. Mild spurring at the [facility] joint. Remote fourth and fifth rib fracture on the right. IMPRESSION: Anterior glenohumeral dislocation. Electronically Signed   By: Tiburcio Pea M.D.   On: 08/22/2022 05:20    Procedures .Sedation  Date/Time: 08/22/2022 6:19 AM  Performed by: Ernie Avena, MD Authorized by: Ernie Avena, MD   Consent:    Consent obtained:  Written   Consent given by:  Patient   Risks discussed:  Nausea, vomiting, inadequate sedation, respiratory compromise necessitating ventilatory assistance and intubation, prolonged hypoxia resulting in organ damage, allergic reaction, dysrhythmia and prolonged sedation necessitating reversal Universal protocol:    Immediately prior to procedure, a time out was called: yes     Patient identity confirmed:  Arm band Indications:    Procedure performed:  Dislocation reduction   Procedure necessitating sedation performed by:  Physician performing sedation Pre-sedation assessment:    Time since last food or drink:  8hrs   ASA classification: class 2 - patient with mild systemic disease     Mouth opening:  3 or more finger widths   Thyromental distance:  3 finger widths   Mallampati score:  I - soft palate, uvula, fauces, pillars visible   Pre-sedation assessments completed and reviewed: airway patency, cardiovascular function, hydration status, mental status, nausea/vomiting, pain level, respiratory function and temperature     Pre-sedation assessment completed:  08/22/2022 6:00 AM Immediate pre-procedure details:    Reassessment: Patient reassessed immediately  prior to procedure     Reviewed: vital signs, relevant labs/tests and NPO status     Verified: bag valve mask available, emergency equipment available, intubation equipment available, IV patency confirmed, oxygen available and reversal medications available   Procedure details (see MAR for exact dosages):    Preoxygenation:  Nasal cannula   Sedation:  Propofol   Intended level of sedation: deep   Analgesia:  Fentanyl   Intra-procedure monitoring:  Blood pressure monitoring, cardiac monitor, continuous capnometry, continuous pulse oximetry, frequent LOC assessments and frequent vital sign checks   Intra-procedure events: none     Total Provider sedation time (minutes):  15 Post-procedure details:    Post-sedation assessment completed:  08/22/2022 6:20 AM   Attendance: Constant attendance by certified staff until patient recovered     Recovery: Patient returned to pre-procedure baseline     Post-sedation assessments completed  and reviewed: airway patency, cardiovascular function, hydration status, mental status, nausea/vomiting, pain level and respiratory function     Patient is stable for discharge or admission: yes     Procedure completion:  Tolerated well, no immediate complications .Ortho Injury Treatment  Date/Time: 08/22/2022 6:20 AM  Performed by: Ernie Avena, MD Authorized by: Ernie Avena, MD   Consent:    Consent obtained:  Verbal   Consent given by:  Patient   Risks discussed:  Recurrent dislocationInjury location: shoulder Location details: right shoulder Injury type: dislocation Dislocation type: anterior Hill-Sachs deformity: yes Chronicity: recurrent Pre-procedure neurovascular assessment: neurovascularly intact Pre-procedure distal perfusion: normal Pre-procedure neurological function: normal Pre-procedure range of motion: reduced Anesthesia: local infiltration  Anesthesia: Local anesthesia used: yes Local Anesthetic: bupivacaine 0.5% without  epinephrine Anesthetic total: 20 mL  Patient sedated: Yes. Refer to sedation procedure documentation for details of sedation. Manipulation performed: yes Reduction method: traction and counter traction and Stimson maneuver Reduction successful: yes X-ray confirmed reduction: yes Immobilization: sling Splint Applied by: ED Tech Post-procedure neurovascular assessment: post-procedure neurovascularly intact       Medications Ordered in ED Medications  bupivacaine(PF) (MARCAINE) 0.5 % injection 20 mL (20 mLs Infiltration Given 08/22/22 0547)  sodium chloride 0.9 % bolus 1,000 mL (1,000 mLs Intravenous New Bag/Given 08/22/22 0603)  ondansetron (ZOFRAN) injection 4 mg (4 mg Intravenous Given 08/22/22 0555)  fentaNYL (SUBLIMAZE) injection 50 mcg (50 mcg Intravenous Given 08/22/22 0555)  fentaNYL (SUBLIMAZE) injection 50 mcg (50 mcg Intravenous Given 08/22/22 0602)  propofol (DIPRIVAN) 10 mg/mL bolus/IV push 41 mg (41 mg Intravenous Given 08/22/22 0557)  propofol (DIPRIVAN) 10 mg/mL bolus/IV push 60 mg (60 mg Intravenous Given 08/22/22 0558)  propofol (DIPRIVAN) 10 mg/mL bolus/IV push 40 mg (40 mg Intravenous Given 08/22/22 0559)  propofol (DIPRIVAN) 10 mg/mL bolus/IV push 40 mg (40 mg Intravenous Given 08/22/22 0602)  propofol (DIPRIVAN) 10 mg/mL bolus/IV push 20 mg (20 mg Intravenous Given 08/22/22 0604)    ED Course/ Medical Decision Making/ A&P                                 Medical Decision Making Amount and/or Complexity of Data Reviewed Radiology: ordered.  Risk Prescription drug management.     57 year old male with medical history significant for alcohol abuse, depression, anxiety, hypertension, recurrent shoulder dislocations who presents to the emergency department with concern for a shoulder dislocation.  The patient states that he was getting up out of bed and reaching and his shoulder dislocated prior to arrival this morning.  He states that he follows outpatient with  orthopedics and is scheduled to get an MRI and eventually will have shoulder surgery.  On arrival, the patient was vitally stable, hypertensive BP 175/120, saturating 98% on room air.  X-ray imaging confirmed an anterior glenohumeral shoulder dislocation.  Patient was consented for procedural sedation.  Attempts were made to relocate the shoulder via bupivacaine injection and manual manipulation prior to sedation which were unsuccessful.  The patient requested sedation.  Consent was signed and the patient was subsequently sedated per the procedural note above with propofol with fentanyl for pain control.  X-ray imaging post-sedation confirms successful reduction of the patient's anterior dislocation.  The patient was placed in a splint and advised outpatient follow-up with his orthopedist.  Overall stable for discharge, monitored post sedation until he was tolerating p.o., back to his baseline.   Final Clinical Impression(s) / ED Diagnoses Final  diagnoses:  Anterior dislocation of right shoulder, initial encounter    Rx / DC Orders ED Discharge Orders     None         Ernie Avena, MD 08/22/22 [phone]

## 2022-08-22 NOTE — ED Notes (Signed)
Patient tolerated procedural sedation without any further respiratory interventions.

## 2022-08-22 NOTE — ED Notes (Signed)
RT at bedside for procedural sedation. ETCO2 monitor in placed with [facility] running. AMBU bag and suction at bedside.

## 2022-08-23 ENCOUNTER — Ambulatory Visit
Admission: RE | Admit: 2022-08-23 | Discharge: 2022-08-23 | Disposition: A | Discharge status: Home / Self Care | Payer: Commercial Managed Care - HMO | Source: Ambulatory Visit | Attending: Orthopedic Surgery | Admitting: Orthopedic Surgery

## 2022-08-23 ENCOUNTER — Ambulatory Visit
Admission: RE | Admit: 2022-08-23 | Discharge: 2022-08-23 | Disposition: A | Discharge status: Home / Self Care | Payer: BC Managed Care – PPO | Source: Ambulatory Visit | Attending: Orthopedic Surgery | Admitting: Orthopedic Surgery

## 2022-08-23 DIAGNOSIS — M25311 Other instability, right shoulder: Secondary | ICD-10-CM

## 2022-08-23 DIAGNOSIS — M25511 Pain in right shoulder: Secondary | ICD-10-CM | POA: Diagnosis not present

## 2022-08-23 DIAGNOSIS — G8929 Other chronic pain: Secondary | ICD-10-CM | POA: Diagnosis not present

## 2022-08-23 DIAGNOSIS — S43014S Anterior dislocation of right humerus, sequela: Secondary | ICD-10-CM | POA: Diagnosis not present

## 2022-08-23 DIAGNOSIS — M19011 Primary osteoarthritis, right shoulder: Secondary | ICD-10-CM | POA: Diagnosis not present

## 2022-08-30 ENCOUNTER — Emergency Department (HOSPITAL_BASED_OUTPATIENT_CLINIC_OR_DEPARTMENT_OTHER)
Admission: EM | Admit: 2022-08-30 | Discharge: 2022-08-30 | Discharge status: Against Medical Advice | Payer: Commercial Managed Care - HMO

## 2022-08-30 NOTE — ED Notes (Addendum)
Pt called for triage, pt did not respond to name called, will try again shortly

## 2022-08-30 NOTE — ED Notes (Signed)
Still no answer from pt for tirage, will call for 3rd call momentary

## 2022-08-31 ENCOUNTER — Other Ambulatory Visit (HOSPITAL_BASED_OUTPATIENT_CLINIC_OR_DEPARTMENT_OTHER): Payer: Self-pay

## 2022-08-31 ENCOUNTER — Other Ambulatory Visit: Payer: Self-pay

## 2022-08-31 ENCOUNTER — Inpatient Hospital Stay (HOSPITAL_BASED_OUTPATIENT_CLINIC_OR_DEPARTMENT_OTHER)
Admission: EM | Admit: 2022-08-31 | Discharge: 2022-09-02 | DRG: 683 | Disposition: A | Discharge status: Home / Self Care | Payer: BC Managed Care – PPO | Attending: Family Medicine | Admitting: Family Medicine

## 2022-08-31 ENCOUNTER — Encounter (HOSPITAL_BASED_OUTPATIENT_CLINIC_OR_DEPARTMENT_OTHER): Payer: Self-pay

## 2022-08-31 DIAGNOSIS — Z818 Family history of other mental and behavioral disorders: Secondary | ICD-10-CM | POA: Diagnosis not present

## 2022-08-31 DIAGNOSIS — F10239 Alcohol dependence with withdrawal, unspecified: Secondary | ICD-10-CM | POA: Diagnosis not present

## 2022-08-31 DIAGNOSIS — Z79899 Other long term (current) drug therapy: Secondary | ICD-10-CM

## 2022-08-31 DIAGNOSIS — F32A Depression, unspecified: Secondary | ICD-10-CM | POA: Diagnosis present

## 2022-08-31 DIAGNOSIS — K219 Gastro-esophageal reflux disease without esophagitis: Secondary | ICD-10-CM | POA: Diagnosis present

## 2022-08-31 DIAGNOSIS — Z825 Family history of asthma and other chronic lower respiratory diseases: Secondary | ICD-10-CM | POA: Diagnosis not present

## 2022-08-31 DIAGNOSIS — F10939 Alcohol use, unspecified with withdrawal, unspecified: Secondary | ICD-10-CM | POA: Diagnosis not present

## 2022-08-31 DIAGNOSIS — N179 Acute kidney failure, unspecified: Secondary | ICD-10-CM | POA: Diagnosis not present

## 2022-08-31 DIAGNOSIS — I10 Essential (primary) hypertension: Secondary | ICD-10-CM | POA: Diagnosis present

## 2022-08-31 DIAGNOSIS — F329 Major depressive disorder, single episode, unspecified: Secondary | ICD-10-CM | POA: Diagnosis not present

## 2022-08-31 DIAGNOSIS — R000 Tachycardia, unspecified: Secondary | ICD-10-CM | POA: Diagnosis present

## 2022-08-31 DIAGNOSIS — E860 Dehydration: Secondary | ICD-10-CM | POA: Diagnosis not present

## 2022-08-31 DIAGNOSIS — F419 Anxiety disorder, unspecified: Secondary | ICD-10-CM | POA: Diagnosis present

## 2022-08-31 DIAGNOSIS — F1010 Alcohol abuse, uncomplicated: Secondary | ICD-10-CM | POA: Diagnosis not present

## 2022-08-31 DIAGNOSIS — Y907 Blood alcohol level of 200-239 mg/100 ml: Secondary | ICD-10-CM | POA: Diagnosis not present

## 2022-08-31 DIAGNOSIS — E861 Hypovolemia: Secondary | ICD-10-CM | POA: Diagnosis not present

## 2022-08-31 DIAGNOSIS — Z811 Family history of alcohol abuse and dependence: Secondary | ICD-10-CM

## 2022-08-31 LAB — CBC WITH DIFFERENTIAL/PLATELET
Abs Immature Granulocytes: 0.05 10*3/uL (ref 0.00–0.07)
Basophils Absolute: 0 10*3/uL (ref 0.0–0.1)
Basophils Relative: 0 %
Eosinophils Absolute: 0 10*3/uL (ref 0.0–0.5)
Eosinophils Relative: 0 %
HCT: 46.3 % (ref 39.0–52.0)
Hemoglobin: 16.4 g/dL (ref 13.0–17.0)
Immature Granulocytes: 0 %
Lymphocytes Relative: 10 %
Lymphs Abs: 1.5 10*3/uL (ref 0.7–4.0)
MCH: 31 pg (ref 26.0–34.0)
MCHC: 35.4 g/dL (ref 30.0–36.0)
MCV: 87.5 fL (ref 80.0–100.0)
Monocytes Absolute: 0.9 10*3/uL (ref 0.1–1.0)
Monocytes Relative: 6 %
Neutro Abs: 12.6 10*3/uL — ABNORMAL HIGH (ref 1.7–7.7)
Neutrophils Relative %: 84 %
Platelets: 405 10*3/uL — ABNORMAL HIGH (ref 150–400)
RBC: 5.29 MIL/uL (ref 4.22–5.81)
RDW: 13.3 % (ref 11.5–15.5)
WBC: 15.1 10*3/uL — ABNORMAL HIGH (ref 4.0–10.5)
nRBC: 0 % (ref 0.0–0.2)

## 2022-08-31 LAB — COMPREHENSIVE METABOLIC PANEL
ALT: 18 U/L (ref 0–44)
AST: 27 U/L (ref 15–41)
Albumin: 4.6 g/dL (ref 3.5–5.0)
Alkaline Phosphatase: 58 U/L (ref 38–126)
Anion gap: 31 — ABNORMAL HIGH (ref 5–15)
BUN: 26 mg/dL — ABNORMAL HIGH (ref 6–20)
CO2: 20 mmol/L — ABNORMAL LOW (ref 22–32)
Calcium: 9.2 mg/dL (ref 8.9–10.3)
Chloride: 89 mmol/L — ABNORMAL LOW (ref 98–111)
Creatinine, Ser: 3.35 mg/dL — ABNORMAL HIGH (ref 0.61–1.24)
GFR, Estimated: 21 mL/min — ABNORMAL LOW (ref >60)
Glucose, Bld: 136 mg/dL — ABNORMAL HIGH (ref 70–99)
Potassium: 3.8 mmol/L (ref 3.5–5.1)
Sodium: 140 mmol/L (ref 135–145)
Total Bilirubin: 1.2 mg/dL (ref 0.3–1.2)
Total Protein: 7.2 g/dL (ref 6.5–8.1)

## 2022-08-31 LAB — LIPASE, BLOOD: Lipase: 57 U/L — ABNORMAL HIGH (ref 11–51)

## 2022-08-31 LAB — ETHANOL: Alcohol, Ethyl (B): 210 mg/dL — ABNORMAL HIGH (ref <10)

## 2022-08-31 MED ORDER — PHENOL 1.4 % MT LIQD
1.0000 | OROMUCOSAL | Status: DC | PRN
  Administered 2022-08-31: 1 via OROMUCOSAL
  Filled 2022-08-31: qty 177

## 2022-08-31 MED ORDER — SODIUM CHLORIDE 0.9 % IV SOLN: Freq: Once | INTRAVENOUS | Status: AC

## 2022-08-31 MED ORDER — THIAMINE MONONITRATE 100 MG PO TABS
100.0000 mg | ORAL_TABLET | Freq: Every day | ORAL | Status: DC
  Administered 2022-08-31 – 2022-09-02 (×3): 100 mg via ORAL
  Filled 2022-08-31 (×3): qty 1

## 2022-08-31 MED ORDER — ALUM & MAG HYDROXIDE-SIMETH 200-200-20 MG/5ML PO SUSP
30.0000 mL | Freq: Once | ORAL | Status: AC
  Administered 2022-08-31: 30 mL via ORAL
  Filled 2022-08-31: qty 30

## 2022-08-31 MED ORDER — LORAZEPAM 1 MG PO TABS: 0.0000 mg | ORAL_TABLET | Freq: Two times a day (BID) | ORAL | Status: DC

## 2022-08-31 MED ORDER — SODIUM CHLORIDE 0.9 % IV BOLUS
2000.0000 mL | Freq: Once | INTRAVENOUS | Status: AC
  Administered 2022-08-31: 2000 mL via INTRAVENOUS

## 2022-08-31 MED ORDER — THIAMINE HCL 100 MG/ML IJ SOLN: 100.0000 mg | Freq: Every day | INTRAMUSCULAR | Status: DC

## 2022-08-31 MED ORDER — SODIUM CHLORIDE 0.9 % IV BOLUS
1000.0000 mL | Freq: Once | INTRAVENOUS | Status: AC
  Administered 2022-08-31: 1000 mL via INTRAVENOUS

## 2022-08-31 MED ORDER — LORAZEPAM 1 MG PO TABS
0.0000 mg | ORAL_TABLET | Freq: Four times a day (QID) | ORAL | Status: DC
  Administered 2022-08-31: 1 mg via ORAL
  Filled 2022-08-31: qty 1

## 2022-08-31 MED ORDER — LORAZEPAM 2 MG/ML IJ SOLN
0.0000 mg | Freq: Four times a day (QID) | INTRAMUSCULAR | Status: DC
  Administered 2022-08-31: 3 mg via INTRAVENOUS
  Filled 2022-08-31: qty 2

## 2022-08-31 MED ORDER — LORAZEPAM 2 MG/ML IJ SOLN
1.0000 mg | Freq: Once | INTRAMUSCULAR | Status: AC
  Administered 2022-08-31: 1 mg via INTRAVENOUS
  Filled 2022-08-31: qty 1

## 2022-08-31 MED ORDER — LORAZEPAM 2 MG/ML IJ SOLN: 0.0000 mg | Freq: Two times a day (BID) | INTRAMUSCULAR | Status: DC

## 2022-08-31 MED ORDER — LACTATED RINGERS IV SOLN: INTRAVENOUS | Status: DC

## 2022-08-31 MED ORDER — ONDANSETRON HCL 4 MG/2ML IJ SOLN: 4.0000 mg | Freq: Four times a day (QID) | INTRAMUSCULAR | Status: DC | PRN

## 2022-08-31 MED ORDER — PANTOPRAZOLE SODIUM 40 MG IV SOLR
40.0000 mg | Freq: Once | INTRAVENOUS | Status: AC
  Administered 2022-08-31: 40 mg via INTRAVENOUS
  Filled 2022-08-31: qty 10

## 2022-08-31 NOTE — ED Notes (Signed)
Pt given snacks and soda per pt request

## 2022-08-31 NOTE — ED Notes (Signed)
Pt states acid reflux has returned, requesting meds. Also reporting visual hallucinations, "seeing black spots." PA made aware and is at bedside.

## 2022-08-31 NOTE — ED Triage Notes (Signed)
Pt requesting detox from alcohol, last drink- wine- this AM. States he has had 5 bottles of wine "over the last couple days." Hx withdrawal w hospitalization. Pt A&O, states "I just want meds."

## 2022-08-31 NOTE — ED Provider Notes (Signed)
[place] EMERGENCY DEPARTMENT AT [facility] Provider Note   CSN: [id number] Arrival date & time: 08/31/22  1423     History  No chief complaint on file.   Alex Logan is a 57 y.o. male.  Patient with history of alcoholism presents with nausea and vomiting without hematemesis x 3 days. He reports being sober for about 6 months but relapsed last week. No abdominal pain. Denies SI/HI. No hallucinations.   The history is provided by the patient. No language interpreter was used.       Home Medications Prior to Admission medications   Medication Sig Start Date End Date Taking? Authorizing Provider  amLODipine (NORVASC) 10 MG tablet Take 1 tablet (10 mg total) by mouth daily. 08/13/20   Lewie Chamber, MD  calcium carbonate (TUMS) 500 MG chewable tablet Chew 1 tablet (200 mg of elemental calcium total) by mouth 2 (two) times daily as needed for indigestion or heartburn. 06/02/22 06/02/23  Standley Brooking, MD  chlordiazePOXIDE (LIBRIUM) 25 MG capsule 50mg  PO TID x 1D, then 25-50mg  PO BID X 1D, then 25-50mg  PO QD X 1D 08/20/22   Horton, Kristie M, DO  escitalopram (LEXAPRO) 20 MG tablet Take 20 mg by mouth daily. 11/08/20   [provider]  folic acid (FOLVITE) 1 MG tablet Take 1 tablet (1 mg total) by mouth daily. 08/20/22 11/28/22  Horton, Clabe Seal, DO  folic acid (FOLVITE) 1 MG tablet Take 1 tablet (1 mg total) by mouth daily. 08/20/22   Horton, Clabe Seal, DO  gabapentin (NEURONTIN) 300 MG capsule Take 1 capsule (300 mg total) by mouth 3 (three) times daily for 10 days. 02/19/22 03/01/22  Pokhrel, Rebekah Chesterfield, MD  lisinopril (ZESTRIL) 20 MG tablet Take 20 mg by mouth daily. 01/25/22   [provider]  Multiple Vitamin (MULTIVITAMIN WITH MINERALS) TABS tablet Take 1 tablet by mouth daily. Patient not taking: Reported on 05/30/2022 01/17/20   Lewie Chamber, MD  pantoprazole (PROTONIX) 40 MG tablet Take 1 tablet (40 mg total) by mouth 2 (two) times daily before a  meal. 06/02/22   Standley Brooking, MD  propranolol ER (INDERAL LA) 80 MG 24 hr capsule Take 80 mg by mouth daily. 10/18/20   [provider]  thiamine (VITAMIN B-1) 100 MG tablet Take 1 tablet (100 mg total) by mouth daily. 08/20/22 11/28/22  Horton, Clabe Seal, DO  thiamine (VITAMIN B1) 100 MG tablet Take 1 tablet (100 mg total) by mouth daily. 08/20/22   Horton, Clabe Seal, DO      Allergies    Patient has no known allergies.    Review of Systems   Review of Systems  Physical Exam Updated Vital Signs BP (!) 133/112   Pulse (!) 124   Temp 97.9 F (36.6 C)   Resp (!) 21   SpO2 100%  Physical Exam Vitals and nursing note reviewed.  Constitutional:      General: He is not in acute distress.    Appearance: He is diaphoretic.  HENT:     Mouth/Throat:     Mouth: Mucous membranes are dry.  Eyes:     General: No scleral icterus.    Conjunctiva/sclera: Conjunctivae normal.  Cardiovascular:     Rate and Rhythm: Tachycardia present.     Heart sounds: No murmur heard. Pulmonary:     Effort: Pulmonary effort is normal.     Breath sounds: No wheezing.  Abdominal:     General: There is no distension.  Palpations: Abdomen is soft.     Tenderness: There is no abdominal tenderness.  Musculoskeletal:        General: Normal range of motion.     Cervical back: Normal range of motion.  Skin:    Coloration: Skin is not jaundiced.  Neurological:     Mental Status: He is oriented to person, place, and time.     Comments: No tremor     ED Results / Procedures / Treatments   Labs (all labs ordered are listed, but only abnormal results are displayed) Labs Reviewed  CBC WITH DIFFERENTIAL/PLATELET - Abnormal; Notable for the following components:      Result Value   WBC 15.1 (*)    Platelets 405 (*)    Neutro Abs 12.6 (*)    All other components within normal limits  COMPREHENSIVE METABOLIC PANEL - Abnormal; Notable for the following components:   Chloride 89 (*)    CO2 20  (*)    Glucose, Bld 136 (*)    BUN 26 (*)    Creatinine, Ser 3.35 (*)    GFR, Estimated 21 (*)    Anion gap 31 (*)    All other components within normal limits  ETHANOL - Abnormal; Notable for the following components:   Alcohol, Ethyl (B) 210 (*)    All other components within normal limits  LIPASE, BLOOD - Abnormal; Notable for the following components:   Lipase 57 (*)    All other components within normal limits    EKG None  Radiology No results found.  Procedures Procedures    Medications Ordered in ED Medications  LORazepam (ATIVAN) injection 0-4 mg ( Intravenous See Alternative 08/31/22 1714)    Or  LORazepam (ATIVAN) tablet 0-4 mg (1 mg Oral Given 08/31/22 1714)  LORazepam (ATIVAN) injection 0-4 mg (has no administration in time range)    Or  LORazepam (ATIVAN) tablet 0-4 mg (has no administration in time range)  thiamine (VITAMIN B1) tablet 100 mg (100 mg Oral Given 08/31/22 1714)    Or  thiamine (VITAMIN B1) injection 100 mg ( Intravenous See Alternative 08/31/22 1714)  sodium chloride 0.9 % bolus 2,000 mL (0 mLs Intravenous Stopped 08/31/22 1600)  sodium chloride 0.9 % bolus 1,000 mL (0 mLs Intravenous Stopped 08/31/22 1706)  LORazepam (ATIVAN) injection 1 mg (1 mg Intravenous Given 08/31/22 1515)  alum & mag hydroxide-simeth (MAALOX/MYLANTA) 200-200-20 MG/5ML suspension 30 mL (30 mLs Oral Given 08/31/22 1541)    ED Course/ Medical Decision Making/ A&P Clinical Course as of 08/31/22 1719  Thu Aug 31, 2022  1647 Labs returned showing AKI with Cr 3.35 (new), Chl 89. He remains tachycardic to 120, currently on 3rd liter of NS bolus. Ativan IV provided. No further vomiting. He remains oriented, without agitation or tremor. CIWA protocol started. Will page hospital for transfer and admission. Patient updated and agrees with plan of care.  [SU]  1717 Discussed with [facility], Dr. Robb Matar, who accepts for transfer and admission to [facility]. Patient updated on plan of  care.  [SU]    Clinical Course User Index [SU] Elpidio Anis, PA-C                                 Medical Decision Making This patient presents to the ED for concern of alcohol withdrawal, this involves an extensive number of treatment options, and is a complaint that carries with it a high risk  of complications and morbidity.  The differential diagnosis includes withdrawal vs intoxication vs gastritis   Co morbidities that complicate the patient evaluation  History of alcohol dependence   Additional history obtained:  Additional history and/or information obtained from chart review, notable for previous admissions for same   Lab Tests:  I Ordered, and personally interpreted labs.  The pertinent results include:  Normal hemoglobin, no evidence active bleeding.     Imaging Studies ordered:   Cardiac Monitoring:  The patient was maintained on a cardiac monitor.  I personally viewed and interpreted the cardiac monitored which showed an underlying rhythm of: Tachycardia, NSR   Medicines ordered and prescription drug management:  I ordered medication including Ativan  for withdrawal, vomiting Reevaluation of the patient after these medicines showed that the patient improved I have reviewed the patients home medicines and have made adjustments as needed   Test Considered:  Labs ordered. CIWA protocol started.    Critical Interventions:  CIWA    Consultations Obtained:  I requested consultation with the [facility],  and discussed lab and imaging findings as well as pertinent plan. Patient accepted in transfer for admission   Problem List / ED Course:  See documented ED course   Reevaluation:  After the interventions noted above, I reevaluated the patient and found that they have :improved   Social Determinants of Health:  Alcohol dependence, continuous   Disposition:  After consideration of the diagnostic results and the patients response to  treatment, I feel that the patient would benefit from admission.   Amount and/or Complexity of Data Reviewed Labs: ordered.  Risk OTC drugs. Prescription drug management. Decision regarding hospitalization.           Final Clinical Impression(s) / ED Diagnoses Final diagnoses:  Alcohol withdrawal syndrome with complication (HCC)  AKI (acute kidney injury) [facility])    Rx / DC Orders ED Discharge Orders     None         Elpidio Anis, PA-C 08/31/22 1719    Horton, Clabe Seal, DO 08/31/22 2236

## 2022-08-31 NOTE — Discharge Instructions (Addendum)
Advised to follow-up with primary care physician in 1 week. Advised to take Librium taper as prescribed. Patient was provided resources for alcohol detox program.

## 2022-08-31 NOTE — ED Notes (Signed)
Pt reported epigastric discomfort from multiple episodes of vomiting earlier, requesting antacid, Melvenia Beam PA made aware

## 2022-08-31 NOTE — ED Notes (Addendum)
[facility] at bedside to transport pt to [facility]. Report called to floor

## 2022-08-31 NOTE — ED Notes (Signed)
Pt assisted to chair per request. Denies dizziness upon standing at this time

## 2022-08-31 NOTE — ED Notes (Signed)
Pt requested something to eat, this tech listed off food choices, pt was given mac & cheese per request.

## 2022-09-01 DIAGNOSIS — F10939 Alcohol use, unspecified with withdrawal, unspecified: Secondary | ICD-10-CM

## 2022-09-01 DIAGNOSIS — N179 Acute kidney failure, unspecified: Secondary | ICD-10-CM | POA: Diagnosis not present

## 2022-09-01 DIAGNOSIS — F329 Major depressive disorder, single episode, unspecified: Secondary | ICD-10-CM

## 2022-09-01 DIAGNOSIS — E861 Hypovolemia: Secondary | ICD-10-CM | POA: Diagnosis not present

## 2022-09-01 LAB — BASIC METABOLIC PANEL
Anion gap: 11 (ref 5–15)
BUN: 23 mg/dL — ABNORMAL HIGH (ref 6–20)
CO2: 27 mmol/L (ref 22–32)
Calcium: 8 mg/dL — ABNORMAL LOW (ref 8.9–10.3)
Chloride: 97 mmol/L — ABNORMAL LOW (ref 98–111)
Creatinine, Ser: 1.47 mg/dL — ABNORMAL HIGH (ref 0.61–1.24)
GFR, Estimated: 56 mL/min — ABNORMAL LOW (ref >60)
Glucose, Bld: 133 mg/dL — ABNORMAL HIGH (ref 70–99)
Potassium: 3.6 mmol/L (ref 3.5–5.1)
Sodium: 135 mmol/L (ref 135–145)

## 2022-09-01 LAB — CBC
HCT: 39.5 % (ref 39.0–52.0)
Hemoglobin: 13.5 g/dL (ref 13.0–17.0)
MCH: 30.5 pg (ref 26.0–34.0)
MCHC: 34.2 g/dL (ref 30.0–36.0)
MCV: 89.4 fL (ref 80.0–100.0)
Platelets: 260 10*3/uL (ref 150–400)
RBC: 4.42 MIL/uL (ref 4.22–5.81)
RDW: 13.2 % (ref 11.5–15.5)
WBC: 15.3 10*3/uL — ABNORMAL HIGH (ref 4.0–10.5)
nRBC: 0 % (ref 0.0–0.2)

## 2022-09-01 LAB — PROTIME-INR
INR: 1.1 (ref 0.8–1.2)
Prothrombin Time: 14.3 seconds (ref 11.4–15.2)

## 2022-09-01 MED ORDER — THIAMINE HCL 100 MG/ML IJ SOLN
100.0000 mg | Freq: Once | INTRAMUSCULAR | Status: AC
  Administered 2022-09-01: 100 mg via INTRAMUSCULAR
  Filled 2022-09-01: qty 2

## 2022-09-01 MED ORDER — ONDANSETRON 4 MG PO TBDP: 4.0000 mg | ORAL_TABLET | Freq: Four times a day (QID) | ORAL | Status: DC | PRN

## 2022-09-01 MED ORDER — CHLORDIAZEPOXIDE HCL 5 MG PO CAPS
25.0000 mg | ORAL_CAPSULE | Freq: Three times a day (TID) | ORAL | Status: DC
  Administered 2022-09-02 (×2): 25 mg via ORAL
  Filled 2022-09-01 (×2): qty 5

## 2022-09-01 MED ORDER — LOPERAMIDE HCL 2 MG PO CAPS: 2.0000 mg | ORAL_CAPSULE | ORAL | Status: DC | PRN

## 2022-09-01 MED ORDER — HYDROXYZINE HCL 25 MG PO TABS
25.0000 mg | ORAL_TABLET | Freq: Four times a day (QID) | ORAL | Status: DC | PRN
  Administered 2022-09-01: 25 mg via ORAL
  Filled 2022-09-01: qty 1

## 2022-09-01 MED ORDER — HEPARIN SODIUM (PORCINE) 5000 UNIT/ML IJ SOLN
5000.0000 [IU] | Freq: Three times a day (TID) | INTRAMUSCULAR | Status: DC
  Administered 2022-09-01 (×3): 5000 [IU] via SUBCUTANEOUS
  Filled 2022-09-01 (×3): qty 1

## 2022-09-01 MED ORDER — CHLORDIAZEPOXIDE HCL 5 MG PO CAPS: 50.0000 mg | ORAL_CAPSULE | Freq: Once | ORAL | Status: DC

## 2022-09-01 MED ORDER — ONDANSETRON HCL 4 MG PO TABS: 4.0000 mg | ORAL_TABLET | Freq: Four times a day (QID) | ORAL | Status: DC | PRN

## 2022-09-01 MED ORDER — CHLORDIAZEPOXIDE HCL 5 MG PO CAPS: 25.0000 mg | ORAL_CAPSULE | ORAL | Status: DC

## 2022-09-01 MED ORDER — ESCITALOPRAM OXALATE 20 MG PO TABS
20.0000 mg | ORAL_TABLET | Freq: Every day | ORAL | Status: DC
  Administered 2022-09-01 – 2022-09-02 (×2): 20 mg via ORAL
  Filled 2022-09-01 (×2): qty 1

## 2022-09-01 MED ORDER — ACETAMINOPHEN 325 MG PO TABS: 650.0000 mg | ORAL_TABLET | Freq: Four times a day (QID) | ORAL | Status: DC | PRN

## 2022-09-01 MED ORDER — PANTOPRAZOLE SODIUM 40 MG PO TBEC
40.0000 mg | DELAYED_RELEASE_TABLET | Freq: Two times a day (BID) | ORAL | Status: DC
  Administered 2022-09-01 – 2022-09-02 (×4): 40 mg via ORAL
  Filled 2022-09-01 (×4): qty 1

## 2022-09-01 MED ORDER — PROPRANOLOL HCL ER 80 MG PO CP24
80.0000 mg | ORAL_CAPSULE | Freq: Every day | ORAL | Status: DC
  Administered 2022-09-01 – 2022-09-02 (×2): 80 mg via ORAL
  Filled 2022-09-01 (×2): qty 1

## 2022-09-01 MED ORDER — ACETAMINOPHEN 650 MG RE SUPP: 650.0000 mg | Freq: Four times a day (QID) | RECTAL | Status: DC | PRN

## 2022-09-01 MED ORDER — CHLORDIAZEPOXIDE HCL 5 MG PO CAPS
25.0000 mg | ORAL_CAPSULE | Freq: Once | ORAL | Status: AC
  Administered 2022-09-01: 25 mg via ORAL
  Filled 2022-09-01: qty 5

## 2022-09-01 MED ORDER — ALUM & MAG HYDROXIDE-SIMETH 200-200-20 MG/5ML PO SUSP
30.0000 mL | Freq: Four times a day (QID) | ORAL | Status: DC | PRN
  Administered 2022-09-01 (×2): 30 mL via ORAL
  Filled 2022-09-01 (×2): qty 30

## 2022-09-01 MED ORDER — HYOSCYAMINE SULFATE 0.125 MG SL SUBL
0.2500 mg | SUBLINGUAL_TABLET | Freq: Once | SUBLINGUAL | Status: AC
  Administered 2022-09-01: 0.25 mg via SUBLINGUAL
  Filled 2022-09-01: qty 2

## 2022-09-01 MED ORDER — CHLORDIAZEPOXIDE HCL 5 MG PO CAPS: 25.0000 mg | ORAL_CAPSULE | Freq: Every day | ORAL | Status: DC

## 2022-09-01 MED ORDER — LORAZEPAM 2 MG/ML IJ SOLN
1.0000 mg | INTRAMUSCULAR | Status: DC | PRN
  Administered 2022-09-01: 4 mg via INTRAVENOUS
  Administered 2022-09-01: 2 mg via INTRAVENOUS
  Filled 2022-09-01 (×2): qty 2
  Filled 2022-09-01: qty 1

## 2022-09-01 MED ORDER — ONDANSETRON HCL 4 MG/2ML IJ SOLN: 4.0000 mg | Freq: Four times a day (QID) | INTRAMUSCULAR | Status: DC | PRN

## 2022-09-01 MED ORDER — ADULT MULTIVITAMIN W/MINERALS CH
1.0000 | ORAL_TABLET | Freq: Every day | ORAL | Status: DC
  Administered 2022-09-01 – 2022-09-02 (×2): 1 via ORAL
  Filled 2022-09-01 (×2): qty 1

## 2022-09-01 MED ORDER — CHLORDIAZEPOXIDE HCL 5 MG PO CAPS
25.0000 mg | ORAL_CAPSULE | Freq: Four times a day (QID) | ORAL | Status: AC
  Administered 2022-09-01 (×4): 25 mg via ORAL
  Filled 2022-09-01 (×4): qty 5

## 2022-09-01 MED ORDER — DICYCLOMINE HCL 10 MG/5ML PO SOLN
10.0000 mg | Freq: Once | ORAL | Status: AC
  Administered 2022-09-01: 10 mg via ORAL
  Filled 2022-09-01: qty 5

## 2022-09-01 MED ORDER — LORAZEPAM 1 MG PO TABS: 1.0000 mg | ORAL_TABLET | ORAL | Status: DC | PRN

## 2022-09-01 NOTE — Plan of Care (Signed)

## 2022-09-01 NOTE — Progress Notes (Signed)
Patient brought from [facility] by EMS around 2145. During the shift patient has been very agitated pacing in the hallways and in his room. CIWA between 19-24 Patient has been given prn anti-anxiety medications with very little response. Since 0200 patient has been requesting to leave. Much encouragement from this nurse to patient to relax and get some rest. Pt continued with requests to leave. Press photographer and NP notified. Charge Nurse spoke with patient and patient realized he needed to stay. Patient became more drowsy pulling out IV. New IV replaced. Patient lying in bed sleep. MD ordered additional dose of Librium.

## 2022-09-01 NOTE — TOC Initial Note (Signed)
Transition of Care [facility]) - Initial/Assessment Note    Patient Details  Name: Alex Logan MRN: [id number] Date of Birth: Dec 10, 1965  Transition of Care [facility]) CM/SW Contact:    Otelia Santee, LCSW Phone Number: 09/01/2022, 2:16 PM  Clinical Narrative:                 Met with pt at bedside to discuss current alcohol use and resources available. Pt shares he has had several periods of sobriety and has been able to remain sober for several months at a time. Pt states that after a few months he begins to drink again and following a few months of drinking casually he begins to binge drink. He shares that attending AA and going to [facility] has helped him in the past. Pt shares that he lives at home alone with a dog and a cat. Pt shares this plays a huge roll in his relapse as he is alone. He feels that if he were in a relationship he would have a better chance at remaining sober.  Pt declines residential resources and shares that he is interested in outpatient counseling so that he will be able to continue working. Outpatient resources placed on AVS.   Expected Discharge Plan: Home/Self Care Barriers to Discharge: No Barriers Identified   Patient Goals and CMS Choice Patient states their goals for this hospitalization and ongoing recovery are:: To go home CMS Medicare.gov Compare Post Acute Care list provided to:: Patient Choice offered to / list presented to : Patient [place] ownership interest in [facility]provided to:: Patient    Expected Discharge Plan and Services In-house Referral: NA Discharge Planning Services: NA Post Acute Care Choice: NA Living arrangements for the past 2 months: Single Family Home                 DME Arranged: N/A DME Agency: NA                  Prior Living Arrangements/Services Living arrangements for the past 2 months: Single Family Home Lives with:: Pets Patient language and need for interpreter reviewed:: Yes Do  you feel safe going back to the place where you live?: Yes      Need for Family Participation in Patient Care: No (Comment) Care giver support system in place?: No (comment)   Criminal Activity/Legal Involvement Pertinent to Current Situation/Hospitalization: No - Comment as needed  Activities of Daily Living Home Assistive Devices/Equipment: None ADL Screening (condition at time of admission) Patient's cognitive ability adequate to safely complete daily activities?: Yes Is the patient deaf or have difficulty hearing?: No Does the patient have difficulty seeing, even when wearing glasses/contacts?: No Does the patient have difficulty concentrating, remembering, or making decisions?: Yes Patient able to express need for assistance with ADLs?: Yes Does the patient have difficulty dressing or bathing?: No Independently performs ADLs?: Yes (appropriate for developmental age) Does the patient have difficulty walking or climbing stairs?: No Weakness of Legs: None Weakness of Arms/Hands: None  Permission Sought/Granted   Permission granted to share information with : No              Emotional Assessment Appearance:: Appears stated age Attitude/Demeanor/Rapport: Engaged Affect (typically observed): Accepting Orientation: : Oriented to Self, Oriented to Place, Oriented to  Time, Oriented to Situation Alcohol / Substance Use: Alcohol Use Psych Involvement: No (comment)  Admission diagnosis:  AKI (acute kidney injury) (HCC) [N17.9] Alcohol withdrawal syndrome with complication [facility]) [F10.939] Patient  Active Problem List   Diagnosis Date Noted   Hiccups 05/30/2022   Alcohol withdrawal delirium, acute, hypoactive (HCC) 05/30/2022   AKI (acute kidney injury) (HCC) 02/17/2022   Thrombocytopenia (HCC) 08/11/2020   Tachycardia 01/14/2020   Elevated LFTs 05/27/2017   Muscular abdominal pain in right flank 05/27/2017   Fatty liver, alcoholic 05/27/2017   Depression with anxiety  05/27/2017   Pancytopenia (HCC) 05/27/2017   SVT (supraventricular tachycardia)    HTN (hypertension) 05/23/2017   Acute vomiting    Pressure injury of skin 11/12/2016   Alcohol withdrawal (HCC) 11/10/2016   SIRS (systemic inflammatory response syndrome) (HCC) 11/10/2016   High anion gap metabolic acidosis 11/10/2016   Lactic acidosis 11/10/2016   Hyponatremia 11/10/2016   Starvation ketoacidosis 11/10/2016   Alcohol use disorder, severe, dependence (HCC) 09/27/2016   Alcohol abuse with alcohol-induced mood disorder (HCC) 09/22/2016   PCP:  [facility]:   [facility] [id number] - [place], [place] - [address] [place] [address] Phone: [phone] Fax: [phone]  [facility] [address] [place] [address] Phone: [phone] Fax: [phone]  [id number] - [place], [place] - [address] [facility] [address] [place] [address] Phone: [phone] Fax: [phone]     Social Determinants of Health (SDOH) Social History: SDOH Screenings   Food Insecurity: No Food Insecurity (09/01/2022)  Housing: Low Risk  (09/01/2022)  Transportation Needs: No Transportation Needs (09/01/2022)  Utilities: Not At Risk (09/01/2022)  Alcohol Screen: Medium Risk (11/12/2016)  Social Connections: Unknown (05/20/2021)   Received from [facility]  Tobacco Use: Low Risk  (08/31/2022)   SDOH Interventions:     Readmission Risk Interventions    09/01/2022    2:13 PM  Readmission Risk Prevention Plan  Transportation Screening Complete  PCP or Specialist Appt within 3-5 Days Complete  HRI or Home Care Consult Complete  Social Work Consult for Recovery Care Planning/Counseling Complete  Palliative Care Screening Not Applicable  Medication Review Oceanographer) Complete

## 2022-09-01 NOTE — H&P (Addendum)
PCP:   [facility]   Chief Complaint:  Alcohol detox  HPI: This is a 57 year old male with past medical history of alcohol abuse, HTN, anxiety and depression, GERD.  Per patient he presented [facility].  Per patient for the past 6 days he has been drinking daily.  Per patient he's stayed passed out.  Adding, he knows he needs to detox because this will kill him.  He has been drinking 1-2 large bottles of wine daily.  Per patient his withdrawal symptoms includes 2 seizures.  His last drink was this a.m.  In the ER patient's presenting vitals 73/61, 132, 19, afebrile.  Creatinine 3.35, baseline normal.  CO2 20, AG 31.  LFTs normal.  Lipase 57.  WBC 15.1.  Alcohol level 210.  EKG sinus tach. Patient given a total of 3 L NS bolus.  CIWA initiated.  Patient accepted in transfer.  Review of Systems:  Per HPI  Past Medical History: Past Medical History:  Diagnosis Date   Alcohol abuse    Anxiety    Depression    Hypertension    Past Surgical History:  Procedure Laterality Date   NO PAST SURGERIES      Medications: Prior to Admission medications   Medication Sig Start Date End Date Taking? Authorizing Provider  amLODipine (NORVASC) 10 MG tablet Take 1 tablet (10 mg total) by mouth daily. 08/13/20   Lewie Chamber, MD  calcium carbonate (TUMS) 500 MG chewable tablet Chew 1 tablet (200 mg of elemental calcium total) by mouth 2 (two) times daily as needed for indigestion or heartburn. 06/02/22 06/02/23  Standley Brooking, MD  chlordiazePOXIDE (LIBRIUM) 25 MG capsule 50mg  PO TID x 1D, then 25-50mg  PO BID X 1D, then 25-50mg  PO QD X 1D 08/20/22   Horton, Kristie M, DO  escitalopram (LEXAPRO) 20 MG tablet Take 20 mg by mouth daily. 11/08/20   [provider]  folic acid (FOLVITE) 1 MG tablet Take 1 tablet (1 mg total) by mouth daily. 08/20/22 11/28/22  Horton, Clabe Seal, DO  folic acid (FOLVITE) 1 MG tablet Take 1 tablet (1 mg total) by mouth daily. 08/20/22    Horton, Clabe Seal, DO  gabapentin (NEURONTIN) 300 MG capsule Take 1 capsule (300 mg total) by mouth 3 (three) times daily for 10 days. 02/19/22 03/01/22  Pokhrel, Rebekah Chesterfield, MD  lisinopril (ZESTRIL) 20 MG tablet Take 20 mg by mouth daily. 01/25/22   [provider]  Multiple Vitamin (MULTIVITAMIN WITH MINERALS) TABS tablet Take 1 tablet by mouth daily. Patient not taking: Reported on 05/30/2022 01/17/20   Lewie Chamber, MD  pantoprazole (PROTONIX) 40 MG tablet Take 1 tablet (40 mg total) by mouth 2 (two) times daily before a meal. 06/02/22   Standley Brooking, MD  propranolol ER (INDERAL LA) 80 MG 24 hr capsule Take 80 mg by mouth daily. 10/18/20   [provider]  thiamine (VITAMIN B-1) 100 MG tablet Take 1 tablet (100 mg total) by mouth daily. 08/20/22 11/28/22  Horton, Clabe Seal, DO  thiamine (VITAMIN B1) 100 MG tablet Take 1 tablet (100 mg total) by mouth daily. 08/20/22   Horton, Clabe Seal, DO    Allergies:  No Known Allergies  Social History:  reports that he has never smoked. He has never used smokeless tobacco. He reports current alcohol use. He reports that he does not use drugs.  Family History: Family History  Problem Relation Age of Onset   Emphysema Mother  Bipolar disorder Father    Alcohol abuse Father    Mental illness Neg Hx     Physical Exam: Vitals:   08/31/22 2045 08/31/22 2100 08/31/22 2142 08/31/22 2331  BP: (!) 126/102 123/69 (!) 158/96 (!) 159/95  Pulse: (!) 133 (!) 117 (!) 132 (!) 129  Resp: (!) 21 (!) 23 18 19   Temp:   99.4 F (37.4 C) 98.2 F (36.8 C)  TempSrc:    Oral  SpO2: 98% 98% 98% 97%    General:  A & O x3, well developed and nourished.  Appears somewhat intoxicated Eyes: Pink conjunctiva, no scleral icterus ENT: Moist oral mucosa, neck supple, no thyromegaly Lungs: clear to ascultation, no wheeze, no crackles, no use of accessory muscles Cardiovascular: Tachycardia, RRR, no regurgitation, no gallops, no murmurs. No carotid  bruits, no JVD Abdomen: soft, positive BS, NTND not an acute abdomen GU: not examined Neuro: CN II - XII grossly intact, sensation intact Musculoskeletal: strength 5/5 all extremities, no clubbing, cyanosis or edema Skin: no rash, no subcutaneous crepitation, no decubitus Psych: appropriate but mildly agitated patient   Labs on Admission:  Recent Labs    08/31/22 1447  NA 140  K 3.8  CL 89*  CO2 20*  GLUCOSE 136*  BUN 26*  CREATININE 3.35*  CALCIUM 9.2   Recent Labs    08/31/22 1447  AST 27  ALT 18  ALKPHOS 58  BILITOT 1.2  PROT 7.2  ALBUMIN 4.6   Recent Labs    08/31/22 1447  LIPASE 57*   Recent Labs    08/31/22 1447  WBC 15.1*  NEUTROABS 12.6*  HGB 16.4  HCT 46.3  MCV 87.5  PLT 405*    Assessment/Plan Present on Admission:  AKI (acute kidney injury) (HCC) -IV fluid hydration LR at 125 cc an hour -I's and O's, BMP in a.m.   Alcohol abuse, withdrawal -CIWA protocol initiated -Librium scheduled, Ativan as needed -Thiamine, folate, MVI daily.  First dose now   Tachycardia -HR 100s.  Multifactorial dehydration and withdrawal. -IV fluid hydration, CIWA, propranolol resumed.   GERD -Protonix twice daily ordered. -Mylanta, Tums PRN   HTN -Propranolol resumed -Lisinopril on hold until creatinine at baseline   Anxiety and depression -Lexapro resumed -Patient states this does not work  Addendum Chief Financial Officer by nursing patient wanted to leave. CIWA 19-24. Librium 25mg  ordered  Aryanna Shaver 09/01/2022, 12:14 AM

## 2022-09-01 NOTE — Care Plan (Signed)
This 57 years old male with PMH significant for alcohol abuse, hypertension, anxiety, depression, GERD presented in the ED for detox.  As per patient for the past 6 days he has been drinking daily.  He knows he needs to detox because he will kill him otherwise with continuous drinking habit.  He has been drinking 1-2 large bottles of wine daily.  Patient also reports withdrawal symptoms including 2 seizures in the past.  His last drink was in the morning yesterday.  In the ED alcohol level was 210.  EKG shows sinus tachycardia.  Patient was given IV fluid resuscitation and started on CIWA protocol and Librium taper.  Patient was seen and examined in the morning.  He reports feeling better he is fully awake and oriented following commands.

## 2022-09-02 DIAGNOSIS — F1010 Alcohol abuse, uncomplicated: Secondary | ICD-10-CM | POA: Diagnosis not present

## 2022-09-02 LAB — COMPREHENSIVE METABOLIC PANEL
ALT: 18 U/L (ref 0–44)
AST: 31 U/L (ref 15–41)
Albumin: 3.2 g/dL — ABNORMAL LOW (ref 3.5–5.0)
Alkaline Phosphatase: 42 U/L (ref 38–126)
Anion gap: 8 (ref 5–15)
BUN: 10 mg/dL (ref 6–20)
CO2: 24 mmol/L (ref 22–32)
Calcium: 7.9 mg/dL — ABNORMAL LOW (ref 8.9–10.3)
Chloride: 100 mmol/L (ref 98–111)
Creatinine, Ser: 0.8 mg/dL (ref 0.61–1.24)
GFR, Estimated: >60 mL/min (ref >60)
Glucose, Bld: 116 mg/dL — ABNORMAL HIGH (ref 70–99)
Potassium: 3.3 mmol/L — ABNORMAL LOW (ref 3.5–5.1)
Sodium: 132 mmol/L — ABNORMAL LOW (ref 135–145)
Total Bilirubin: 1.2 mg/dL (ref 0.3–1.2)
Total Protein: 5.8 g/dL — ABNORMAL LOW (ref 6.5–8.1)

## 2022-09-02 LAB — CBC
HCT: 35.9 % — ABNORMAL LOW (ref 39.0–52.0)
Hemoglobin: 12.1 g/dL — ABNORMAL LOW (ref 13.0–17.0)
MCH: 31 pg (ref 26.0–34.0)
MCHC: 33.7 g/dL (ref 30.0–36.0)
MCV: 92.1 fL (ref 80.0–100.0)
Platelets: 192 10*3/uL (ref 150–400)
RBC: 3.9 MIL/uL — ABNORMAL LOW (ref 4.22–5.81)
RDW: 12.9 % (ref 11.5–15.5)
WBC: 7.9 10*3/uL (ref 4.0–10.5)
nRBC: 0 % (ref 0.0–0.2)

## 2022-09-02 LAB — MAGNESIUM: Magnesium: 2 mg/dL (ref 1.7–2.4)

## 2022-09-02 LAB — PHOSPHORUS: Phosphorus: 1.7 mg/dL — ABNORMAL LOW (ref 2.5–4.6)

## 2022-09-02 MED ORDER — POTASSIUM PHOSPHATES 15 MMOLE/5ML IV SOLN
30.0000 mmol | Freq: Once | INTRAVENOUS | Status: AC
  Administered 2022-09-02: 30 mmol via INTRAVENOUS
  Filled 2022-09-02: qty 10

## 2022-09-02 MED ORDER — POTASSIUM CHLORIDE 20 MEQ PO PACK
40.0000 meq | PACK | Freq: Once | ORAL | Status: AC
  Administered 2022-09-02: 40 meq via ORAL
  Filled 2022-09-02: qty 2

## 2022-09-02 MED ORDER — K PHOS MONO-SOD PHOS DI & MONO 155-852-130 MG PO TABS
250.0000 mg | ORAL_TABLET | Freq: Four times a day (QID) | ORAL | Status: DC
  Administered 2022-09-02 (×3): 250 mg via ORAL
  Filled 2022-09-02 (×4): qty 1

## 2022-09-02 MED ORDER — PROPRANOLOL HCL ER 80 MG PO CP24: 80.0000 mg | ORAL_CAPSULE | Freq: Every day | ORAL | 1 refills | Status: DC

## 2022-09-02 MED ORDER — CHLORDIAZEPOXIDE HCL 25 MG PO CAPS: ORAL_CAPSULE | ORAL | 0 refills | Status: DC

## 2022-09-02 NOTE — Plan of Care (Signed)

## 2022-09-02 NOTE — Plan of Care (Signed)
Patient will discharge when Phos gtts completes. Has been ambulating in the halls today, and is steady on feet.

## 2022-09-02 NOTE — Discharge Summary (Signed)
Physician Discharge Summary  Alex Logan WGN:[id number] DOB: 10-08-1965 DOA: 08/31/2022  PCP: [facility]  Admit date: 08/31/2022  Discharge date: 09/02/2022  Admitted From: Home  Disposition:  Home  Recommendations for Outpatient Follow-up:  Follow up with PCP in 1-2 weeks. Please obtain BMP/CBC in one week. Advised to take Librium taper as prescribed. Patient is given resources for alcohol detox programs.  Home Health:None Equipment/Devices:None  Discharge Condition: Stable CODE STATUS:Full code Diet recommendation: Heart Healthy   Brief [facility] Course: This 57 years old male with PMH significant for alcohol abuse, hypertension, anxiety, depression, GERD presented in the ED for detox.  As per patient for the past 6 days he has been drinking daily.  He knows he needs to detox because he will kill him otherwise with continuous drinking habit.  He has been drinking 1-2 large bottles of wine daily.  Patient also reports withdrawal symptoms including 2 seizures in the past.  His last drink was in the morning yesterday.  In the ED alcohol level was 210.  EKG shows sinus tachycardia.  Patient was given IV fluid resuscitation and started on CIWA protocol and Librium taper.    He reports feeling better he is fully awake and oriented following commands.  Patient has made significant improvement.  He is back to his baseline mental status.  Patient was provided resources for alcohol detox programs.  He felt better wants to be discharged.  Patient is being discharged home with Librium taper.  Discharge Diagnoses:  Active Problems:   AKI (acute kidney injury) [facility])    Discharge Instructions  Discharge Instructions     Call MD for:  difficulty breathing, headache or visual disturbances   Complete by: As directed    Call MD for:  persistant dizziness or light-headedness   Complete by: As directed    Call MD for:  persistant nausea and vomiting   Complete by: As  directed    Diet - low sodium heart healthy   Complete by: As directed    Diet Carb Modified   Complete by: As directed    Discharge instructions   Complete by: As directed    Advised to follow-up with primary care physician in 1 week. Advised to take Librium 25 mg taper as prescribed. Patient was provided resources for alcohol detox programs.   Increase activity slowly   Complete by: As directed       Allergies as of 09/02/2022   No Known Allergies      Medication List     TAKE these medications    calcium carbonate 500 MG chewable tablet Commonly known as: Tums Chew 1 tablet (200 mg of elemental calcium total) by mouth 2 (two) times daily as needed for indigestion or heartburn. What changed:  how much to take when to take this   chlordiazePOXIDE 25 MG capsule Commonly known as: LIBRIUM Take 1 capsule (25 mg total) by mouth 3 (three) times daily for 1 day, THEN 1 capsule (25 mg total) 2 (two) times daily in the am and at bedtime. for 1 day, THEN 1 capsule (25 mg total) daily for 1 day. Start taking on: September 02, 2022   escitalopram 20 MG tablet Commonly known as: LEXAPRO Take 10 mg by mouth daily.   folic acid 1 MG tablet Commonly known as: FOLVITE Take 1 tablet (1 mg total) by mouth daily. What changed: when to take this   lisinopril 20 MG tablet Commonly known as: ZESTRIL Take 20 mg  by mouth daily.   multivitamin with minerals Tabs tablet Take 1 tablet by mouth daily.   pantoprazole 40 MG tablet Commonly known as: PROTONIX Take 1 tablet (40 mg total) by mouth 2 (two) times daily before a meal. What changed: when to take this   propranolol ER 80 MG 24 hr capsule Commonly known as: INDERAL LA Take 1 capsule (80 mg total) by mouth daily.   thiamine 100 MG tablet Commonly known as: VITAMIN B1 Take 1 tablet (100 mg total) by mouth daily.        Follow-up Information     [facility] Follow up in 1 week(s).   Contact information: [phone] [place] [address] [phone]                No Known Allergies  Consultations: None   Procedures/Studies: DG Shoulder 1 View Right  Result Date: 08/22/2022 CLINICAL DATA:  Shoulder reduction. EXAM: RIGHT SHOULDER - 1 VIEW COMPARISON:  Earlier today and 04/25/2022 FINDINGS: Relocated glenohumeral joint in the frontal projection. 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 Degenerative spurring at the [facility] joint. Remote right rib fractures. IMPRESSION: Relocated glenohumeral joint. Electronically Signed   By: Tiburcio Pea M.D.   On: 08/22/2022 06:29   DG Shoulder Right  Result Date: 08/22/2022 CLINICAL DATA:  Shoulder injury. EXAM: RIGHT SHOULDER - 2 VIEW COMPARISON:  04/25/2022 FINDINGS: Anterior glenohumeral dislocation without acute fracture. Mild spurring at the [facility] joint. Remote fourth and fifth rib fracture on the right. IMPRESSION: Anterior glenohumeral dislocation. Electronically Signed   By: Tiburcio Pea M.D.   On: 08/22/2022 05:20     Subjective: Patient was seen and examined at bedside.  Overnight events noted.   Patient reports doing much better and wants to be discharged.  Discharge Exam: Vitals:   09/02/22 0437 09/02/22 1332  BP: (!) 164/112 (!) 149/104  Pulse: 88 (!) 105  Resp: 18 16  Temp: 98.9 F (37.2 C) 97.8 F (36.6 C)  SpO2: 97% 99%   Vitals:   09/01/22 2012 09/02/22 0000 09/02/22 0437 09/02/22 1332  BP: (!) 168/102 (!) 162/107 (!) 164/112 (!) 149/104  Pulse: 91 84 88 (!) 105  Resp: 18 18 18 16   Temp: 98.6 F (37 C) 98.3 F (36.8 C) 98.9 F (37.2 C) 97.8 F (36.6 C)  TempSrc: Oral Oral Oral Oral  SpO2: 98% 96% 97% 99%  Weight:      Height:        General: Pt is alert, awake, not in acute distress Cardiovascular: RRR, S1/S2 +, no rubs, no gallops Respiratory: CTA bilaterally, no wheezing, no rhonchi Abdominal: Soft, NT, ND, bowel sounds + Extremities: no edema, no cyanosis    The results of significant diagnostics  from this hospitalization (including imaging, microbiology, ancillary and laboratory) are listed below for reference.     Microbiology: No results found for this or any previous visit (from the past 240 hour(s)).   Labs: BNP (last 3 results) No results for input(s): "BNP" in the last 8760 hours. Basic Metabolic Panel: Recent Labs  Lab 08/31/22 1447 09/01/22 0457 09/02/22 0717  NA 140 135 132*  K 3.8 3.6 3.3*  CL 89* 97* 100  CO2 20* 27 24  GLUCOSE 136* 133* 116*  BUN 26* 23* 10  CREATININE 3.35* 1.47* 0.80  CALCIUM 9.2 8.0* 7.9*  MG  --   --  2.0  PHOS  --   --  1.7*   Liver Function Tests: Recent Labs  Lab 08/31/22 1447 09/02/22 0717  AST 27 31  ALT 18 18  ALKPHOS 58 42  BILITOT 1.2 1.2  PROT 7.2 5.8*  ALBUMIN 4.6 3.2*   Recent Labs  Lab 08/31/22 1447  LIPASE 57*   No results for input(s): "AMMONIA" in the last 168 hours. CBC: Recent Labs  Lab 08/31/22 1447 09/01/22 0457 09/02/22 0717  WBC 15.1* 15.3* 7.9  NEUTROABS 12.6*  --   --   HGB 16.4 13.5 12.1*  HCT 46.3 39.5 35.9*  MCV 87.5 89.4 92.1  PLT 405* 260 192   Cardiac Enzymes: No results for input(s): "CKTOTAL", "CKMB", "CKMBINDEX", "TROPONINI" in the last 168 hours. BNP: Invalid input(s): "POCBNP" CBG: No results for input(s): "GLUCAP" in the last 168 hours. D-Dimer No results for input(s): "DDIMER" in the last 72 hours. Hgb A1c No results for input(s): "HGBA1C" in the last 72 hours. Lipid Profile No results for input(s): "CHOL", "HDL", "LDLCALC", "TRIG", "CHOLHDL", "LDLDIRECT" in the last 72 hours. Thyroid function studies No results for input(s): "TSH", "T4TOTAL", "T3FREE", "THYROIDAB" in the last 72 hours.  Invalid input(s): "FREET3" Anemia work up No results for input(s): "VITAMINB12", "FOLATE", "FERRITIN", "TIBC", "IRON", "RETICCTPCT" in the last 72 hours. Urinalysis    Component Value Date/Time   COLORURINE YELLOW 02/17/2022 0129   APPEARANCEUR CLEAR 02/17/2022 0129   LABSPEC  1.022 02/17/2022 0129   PHURINE 5.0 02/17/2022 0129   GLUCOSEU NEGATIVE 02/17/2022 0129   HGBUR NEGATIVE 02/17/2022 0129   BILIRUBINUR NEGATIVE 02/17/2022 0129   KETONESUR TRACE (A) 02/17/2022 0129   PROTEINUR 30 (A) 02/17/2022 0129   UROBILINOGEN 0.2 12/06/2012 1725   NITRITE NEGATIVE 02/17/2022 0129   LEUKOCYTESUR NEGATIVE 02/17/2022 0129   Sepsis Labs Recent Labs  Lab 08/31/22 1447 09/01/22 0457 09/02/22 0717  WBC 15.1* 15.3* 7.9   Microbiology No results found for this or any previous visit (from the past 240 hour(s)).   Time coordinating discharge: Over 30 minutes  SIGNED:   Willeen Niece, MD  [facility] 09/02/2022, 1:34 PM Pager

## 2022-09-05 ENCOUNTER — Encounter: Payer: Self-pay | Admitting: Internal Medicine

## 2022-09-05 ENCOUNTER — Ambulatory Visit (INDEPENDENT_AMBULATORY_CARE_PROVIDER_SITE_OTHER): Payer: Commercial Managed Care - HMO | Admitting: Internal Medicine

## 2022-09-05 VITALS — BP 120/88 | HR 112 | Ht 68.0 in | Wt 174.2 lb

## 2022-09-05 DIAGNOSIS — K219 Gastro-esophageal reflux disease without esophagitis: Secondary | ICD-10-CM | POA: Diagnosis not present

## 2022-09-05 DIAGNOSIS — Z789 Other specified health status: Secondary | ICD-10-CM

## 2022-09-05 DIAGNOSIS — K760 Fatty (change of) liver, not elsewhere classified: Secondary | ICD-10-CM

## 2022-09-05 DIAGNOSIS — R112 Nausea with vomiting, unspecified: Secondary | ICD-10-CM | POA: Diagnosis not present

## 2022-09-05 MED ORDER — ESOMEPRAZOLE MAGNESIUM 40 MG PO CPDR: 40.0000 mg | DELAYED_RELEASE_CAPSULE | Freq: Two times a day (BID) | ORAL | 0 refills | Status: DC

## 2022-09-05 NOTE — Patient Instructions (Signed)
You have been scheduled for an abdominal ultrasound with elastography at [facility] (1st floor). Your appointment is scheduled for 09/12/22 at 7 AM. Please arrive 30 minutes prior to your scheduled appointment for registration purposes. Make certain not to have anything to eat or drink after midnight. Should you need to reschedule your appointment, you may contact radiology at [phone].  Liver Elastography Various chronic liver diseases such as hepatitis B, C, and fatty liver disease can lead to tissue damage and subsequent scar tissue formation. As the scar tissue accumulates, the liver loses some of its elasticity and becomes stiffer. Liver elastography involves the use of a surface ultrasound probe that delivers a low frequency pulse or shear wave to a small volume of liver tissue under the rib cage. The transmission of the sound wave is completely painless. How Is a Liver Elastography Performed? The liver is located in the right upper abdomen under the rib cage. Patients are asked to lie flat on an examination table. A technician places the FibroScan probe between the ribs on the right side of the lower chest wall. A series of 10 painless pulses are then applied to the liver. The results are recorded on the equipment and an overall liver stiffness score is generated. This score is then interpreted by a qualified physician to predict the likelihood of advanced fibrosis or cirrhosis.  Patients are asked to wear loose clothing and should not consume any liquids or solids for a minimum of 4 hours before the test to increase the likelihood of obtaining reliable test results. The scan will take 10 to 15 minutes to complete, but patients should plan on being available for 30 minutes to allow time for preparation   You have been scheduled for an endoscopy. Please follow written instructions given to you at your visit today.  If you use inhalers (even only as needed), please bring them with you  on the day of your procedure.  If you take any of the following medications, they will need to be adjusted prior to your procedure:   DO NOT TAKE 7 DAYS PRIOR TO TEST- Trulicity (dulaglutide) Ozempic, Wegovy (semaglutide) Mounjaro (tirzepatide) Bydureon Bcise (exanatide extended release)  DO NOT TAKE 1 DAY PRIOR TO YOUR TEST Rybelsus (semaglutide) Adlyxin (lixisenatide) Victoza (liraglutide) Byetta (exanatide)  Congratulations on alcohol cessation   We have sent the following medications to your pharmacy for you to pick up at your convenience: Nexium 40 mg 2 times daily   If your blood pressure at your visit was 140/90 or greater, please contact your primary care physician to follow up on this.  _______________________________________________________  If you are age 56 or older, your body mass index should be between 23-30. Your Body mass index is 26.49 kg/m. If this is out of the aforementioned range listed, please consider follow up with your Primary Care Provider.  If you are age 7 or younger, your body mass index should be between 19-25. Your Body mass index is 26.49 kg/m. If this is out of the aformentioned range listed, please consider follow up with your Primary Care Provider.   ________________________________________________________  The [facility] providers would like to encourage you to use [facility] to communicate with providers for non-urgent requests or questions.  Due to long hold times on the telephone, sending your provider a message by [facility] may be a faster and more efficient way to get a response.  Please allow 48 business hours for a response.  Please remember that this is for  non-urgent requests.  _______________________________________________________   Due to recent changes in healthcare laws, you may see the results of your imaging and laboratory studies on MyChart before your provider has had a chance to review them.  We understand that in some cases there  may be results that are confusing or concerning to you. Not all laboratory results come back in the same time frame and the provider may be waiting for multiple results in order to interpret others.  Please give [place] 48 hours in order for your provider to thoroughly review all the results before contacting the office for clarification of your results.    Thank you for entrusting me with your care and for choosing [facility], Dr. Eulah Pont

## 2022-09-05 NOTE — Progress Notes (Signed)
Chief Complaint: GERD  HPI : 57 year old male with history of alcohol use, anxiety/depression, GERD, and HFpEF presents with GERD  He was hospitalized in 08/2022 for alcohol detox. He has been fighting alcohol addiction for the last 16-17 years. He tried to switch from hard liquor to wine, but he was not able to tolerate it.  He is also attempted to reduce his alcohol intake, which was also not effective.  He went to rehab twice at [facility] and has not consistently used the tools that he learned at rehab. However, he realizes now that he needs to use all the resources that he can to stop himself from drinking alcohol. Endorses N&V and acid reflux. Last night he had two cheeseburgers and within an hour he was throwing up. His acid reflux has gotten so bad that even with alcohol use, he will have severe symptoms. Denies dysphagia. Endorse chest burning and regurgitation. He takes Nexium 20 mg BID, which helps with his chest burning but does not take it away completely. Denies hematemesis, ab pain, and constipation. He will get diarhrea when he is drinking alcohol. Denies blood in the stools. Denies prior EGD. Last colonoscopy was 3 years ago that was normal, and he was recommended for a 10 year follow up. Denies family history of GI issues. Denies confusion or BLE/abdominal edema   Past Medical History:  Diagnosis Date   Alcohol abuse    Anxiety    Depression    GERD (gastroesophageal reflux disease)    Hypertension      Past Surgical History:  Procedure Laterality Date   NO PAST SURGERIES     Family History  Problem Relation Age of Onset   Emphysema Mother    COPD Mother    Bipolar disorder Father    Alcohol abuse Father    Hypertension Father    Alcoholism Paternal Grandmother    Alcoholism Paternal Grandfather    Mental illness Neg Hx    Social History   Tobacco Use   Smoking status: Never   Smokeless tobacco: Never  Vaping Use   Vaping status: Never Used  Substance  Use Topics   Alcohol use: Yes    Comment: Chronic ETOH use and abuse   Drug use: No   Current Outpatient Medications  Medication Sig Dispense Refill   calcium carbonate (TUMS) 500 MG chewable tablet Chew 1 tablet (200 mg of elemental calcium total) by mouth 2 (two) times daily as needed for indigestion or heartburn. (Patient taking differently: Chew 1,500 mg by mouth 3 (three) times daily as needed for indigestion or heartburn.)     escitalopram (LEXAPRO) 20 MG tablet Take 10 mg by mouth daily.     Esomeprazole Magnesium (NEXIUM 24HR) 20 MG TBEC Take 1 tablet by mouth 2 (two) times daily.     lisinopril (ZESTRIL) 20 MG tablet Take 20 mg by mouth daily.     Multiple Vitamin (MULTIVITAMIN WITH MINERALS) TABS tablet Take 1 tablet by mouth daily.     propranolol ER (INDERAL LA) 80 MG 24 hr capsule Take 1 capsule (80 mg total) by mouth daily. (Patient not taking: Reported on 09/05/2022) 30 capsule 1   No current facility-administered medications for this visit.   No Known Allergies   Review of Systems: All systems reviewed and negative except where noted in HPI.   Physical Exam: BP 120/88 (BP Location: Left Arm, Patient Position: Sitting, Cuff Size: Normal)   Pulse (!) 112   Ht 5\' 8"  (  1.727 m) Comment: height measured without shoes  Wt 174 lb 4 oz (79 kg)   BMI 26.49 kg/m  Constitutional: Pleasant,well-developed, male in no acute distress. HEENT: Normocephalic and atraumatic. Conjunctivae are normal. No scleral icterus. Cardiovascular: Normal rate, regular rhythm.  Pulmonary/chest: Effort normal and breath sounds normal. No wheezing, rales or rhonchi. Abdominal: Soft, nondistended, nontender. Bowel sounds active throughout. There are no masses palpable. No hepatomegaly. Extremities: No edema Neurological: Alert and oriented to person place and time. Skin: Skin is warm and dry. No rashes noted. Psychiatric: Normal mood and affect. Behavior is normal.  Labs 08/2022: CBC with low Hb of  12.1 and platelets of 192. CMP with low Na of 132 and low K of 3.3. LFTs nml. INR nml. Ethanol level elevated at 210.   CT A/P w/contrast 05/23/17: IMPRESSION: Severe diffuse fatty infiltration of the liver. Small single layering gallstone. No CT evidence for cholecystitis. No visible biliary stone or dilatation. Normal appendix. Mildly prominent prostate. Scattered aortic calcifications.  ASSESSMENT AND PLAN: Refractory GERD N&V Alcohol use Fatty liver Gallstone Patient presents with fairly significant acid reflux and nausea and vomiting.  He has a history of significant alcohol use and recently underwent detox.  No prior history of liver disease, but prior abdominal imaging does show severe diffuse fatty infiltration of liver.  His last set of LFTs were normal, and his most recent platelet level, while downtrending, was normal.  Will give him a handout on how to combat acid reflux and will increase his Nexium to try to help with his GERD and nausea/vomiting.  Will plan for elastography to evaluate for underlying fibrosis of the liver.  Will also plan for an EGD for further evaluation of his refractory GERD and nausea/vomiting - Congratulated on alcohol cessation - GERD handout - Increase Nexium from 20 mg BID to 40 mg BID - Ab U/S with elastography  - EGD [facility]  Eulah Pont, MD  I spent 49 minutes of time, including in depth chart review, independent review of results as outlined above, communicating results with the patient directly, face-to-face time with the patient, coordinating care, ordering studies and medications as appropriate, and documentation.

## 2022-09-08 ENCOUNTER — Other Ambulatory Visit: Payer: Self-pay

## 2022-09-08 ENCOUNTER — Telehealth: Payer: Self-pay | Admitting: Internal Medicine

## 2022-09-08 MED ORDER — ESOMEPRAZOLE MAGNESIUM 40 MG PO CPDR: 40.0000 mg | DELAYED_RELEASE_CAPSULE | Freq: Two times a day (BID) | ORAL | 0 refills | Status: DC

## 2022-09-08 NOTE — Telephone Encounter (Signed)
Patient said the esomeprazole medication is working and would like a script sent to [facility] [address]

## 2022-09-12 ENCOUNTER — Ambulatory Visit (HOSPITAL_COMMUNITY): Payer: Commercial Managed Care - HMO

## 2022-09-14 ENCOUNTER — Encounter: Payer: Commercial Managed Care - HMO | Admitting: Internal Medicine

## 2023-04-12 ENCOUNTER — Emergency Department (HOSPITAL_COMMUNITY)

## 2023-04-12 ENCOUNTER — Other Ambulatory Visit: Payer: Self-pay

## 2023-04-12 ENCOUNTER — Encounter (HOSPITAL_COMMUNITY): Payer: Self-pay

## 2023-04-12 ENCOUNTER — Emergency Department (HOSPITAL_COMMUNITY)
Admission: EM | Admit: 2023-04-12 | Discharge: 2023-04-12 | Disposition: A | Discharge status: Home / Self Care | Attending: Emergency Medicine | Admitting: Emergency Medicine

## 2023-04-12 DIAGNOSIS — S43004A Unspecified dislocation of right shoulder joint, initial encounter: Secondary | ICD-10-CM

## 2023-04-12 DIAGNOSIS — X500XXA Overexertion from strenuous movement or load, initial encounter: Secondary | ICD-10-CM | POA: Diagnosis not present

## 2023-04-12 DIAGNOSIS — S43015A Anterior dislocation of left humerus, initial encounter: Secondary | ICD-10-CM | POA: Insufficient documentation

## 2023-04-12 DIAGNOSIS — M25511 Pain in right shoulder: Secondary | ICD-10-CM | POA: Diagnosis present

## 2023-04-12 MED ORDER — MORPHINE SULFATE (PF) 4 MG/ML IV SOLN
4.0000 mg | Freq: Once | INTRAVENOUS | Status: AC
  Administered 2023-04-12: 4 mg via INTRAVENOUS
  Filled 2023-04-12: qty 1

## 2023-04-12 MED ORDER — PROPOFOL 10 MG/ML IV BOLUS
0.5000 mg/kg | Freq: Once | INTRAVENOUS | Status: AC
  Administered 2023-04-12: 60 mg via INTRAVENOUS
  Filled 2023-04-12: qty 20

## 2023-04-12 MED ORDER — IBUPROFEN 400 MG PO TABS
600.0000 mg | ORAL_TABLET | Freq: Once | ORAL | Status: AC
  Administered 2023-04-12: 600 mg via ORAL
  Filled 2023-04-12: qty 2

## 2023-04-12 MED ORDER — PROPOFOL 10 MG/ML IV BOLUS
INTRAVENOUS | Status: AC | PRN
  Administered 2023-04-12: 30 mg via INTRAVENOUS

## 2023-04-12 NOTE — Sedation Documentation (Signed)
 MD successfully reduced patients right shoulder. Pt placed in sling. RN remains at bedside.

## 2023-04-12 NOTE — ED Provider Notes (Signed)
 [place] EMERGENCY DEPARTMENT AT [facility] Provider Note   CSN: [id number] Arrival date & time: 04/12/23  8295     History  Chief Complaint  Patient presents with   Shoulder Dislocation    Alex Logan is a 58 y.o. male.  He is currently incarcerated.  Complaining of right shoulder dislocation around 530 this morning while lifting a chair.  History of same.  He is right-hand dominant.  No other injuries or complaints.  Last ate or drank something last evening.  Has had conscious sedation before for reduction.  The history is provided by the patient.  Shoulder Injury This is a recurrent problem. The current episode started 1 to 2 hours ago. The problem occurs constantly. The problem has not changed since onset.Pertinent negatives include no chest pain, no abdominal pain, no headaches and no shortness of breath. The symptoms are aggravated by bending and twisting. Nothing relieves the symptoms. He has tried nothing for the symptoms. The treatment provided no relief.       Home Medications Prior to Admission medications   Medication Sig Start Date End Date Taking? Authorizing Provider  calcium carbonate (TUMS) 500 MG chewable tablet Chew 1 tablet (200 mg of elemental calcium total) by mouth 2 (two) times daily as needed for indigestion or heartburn. Patient taking differently: Chew 1,500 mg by mouth 3 (three) times daily as needed for indigestion or heartburn. 06/02/22 06/02/23  Standley Brooking, MD  escitalopram (LEXAPRO) 20 MG tablet Take 10 mg by mouth daily. 11/08/20   [provider]  esomeprazole (NEXIUM) 40 MG capsule Take 1 capsule (40 mg total) by mouth 2 (two) times daily. 09/08/22 10/08/22  Imogene Burn, MD  Esomeprazole Magnesium (NEXIUM 24HR) 20 MG TBEC Take 1 tablet by mouth 2 (two) times daily.    [provider]  lisinopril (ZESTRIL) 20 MG tablet Take 20 mg by mouth daily. 01/25/22   [provider]  Multiple Vitamin  (MULTIVITAMIN WITH MINERALS) TABS tablet Take 1 tablet by mouth daily. 01/17/20   Lewie Chamber, MD  propranolol ER (INDERAL LA) 80 MG 24 hr capsule Take 1 capsule (80 mg total) by mouth daily. Patient not taking: Reported on 09/05/2022 09/02/22   Willeen Niece, MD      Allergies    Patient has no known allergies.    Review of Systems   Review of Systems  Respiratory:  Negative for shortness of breath.   Cardiovascular:  Negative for chest pain.  Gastrointestinal:  Negative for abdominal pain.  Neurological:  Negative for headaches.    Physical Exam Updated Vital Signs BP (!) 129/94   Pulse 81   Temp 98 F (36.7 C)   Resp 18   Ht 5\' 10"  (1.778 m)   Wt 81.6 kg   SpO2 98%   BMI 25.83 kg/m  Physical Exam Vitals and nursing note reviewed.  Constitutional:      General: He is not in acute distress.    Appearance: Normal appearance. He is well-developed.  HENT:     Head: Normocephalic and atraumatic.  Eyes:     Conjunctiva/sclera: Conjunctivae normal.  Cardiovascular:     Rate and Rhythm: Normal rate and regular rhythm.     Heart sounds: No murmur heard. Pulmonary:     Effort: Pulmonary effort is normal. No respiratory distress.     Breath sounds: Normal breath sounds.  Abdominal:     Palpations: Abdomen is soft.     Tenderness: There is no  abdominal tenderness.  Musculoskeletal:        General: Tenderness and deformity present.     Cervical back: Neck supple.     Comments: Right shoulder has fullness anteriorly and hollow under the acromion.  Unable to normally internally externally rotate.  Axillary sensation intact.  Elbow wrist nontender.  Radial pulse 2+ and distal neurovascular intact.  Skin:    General: Skin is warm and dry.     Capillary Refill: Capillary refill takes less than 2 seconds.  Neurological:     Mental Status: He is alert.     Sensory: No sensory deficit.     Motor: No weakness.     ED Results / Procedures / Treatments   Labs (all labs  ordered are listed, but only abnormal results are displayed) Labs Reviewed - No data to display  EKG None  Radiology DG Shoulder Right Portable Result Date: 04/12/2023 CLINICAL DATA:  Reduction. EXAM: RIGHT SHOULDER - 1 VIEW COMPARISON:  Radiograph from earlier the same day. FINDINGS: Status post attempted reduction of right anterior shoulder dislocation. There is satisfactory reduction. There is stable appearance of Hill-Sachs deformity along the superolateral right humeral head. Mild degenerative changes of right acromioclavicular joint, which is otherwise normal in alignment. Subacute/healing posterior right fifth rib fracture noted. IMPRESSION: Satisfactory reduction of right anterior shoulder dislocation. Electronically Signed   By: Jules Schick M.D.   On: 04/12/2023 10:56   DG Shoulder Right Result Date: 04/12/2023 CLINICAL DATA:  History of shoulder dislocation with arm pain EXAM: RIGHT SHOULDER - 2 VIEW COMPARISON:  Right shoulder radiograph dated 08/22/2022 FINDINGS: Anterior-inferior dislocation of the right humeral head relative to the glenoid. No acute displaced fracture. There is no evidence of arthropathy or other focal bone abnormality. Soft tissues are unremarkable. Old fracture of the right posterior fifth rib. IMPRESSION: Anterior right shoulder dislocation. Electronically Signed   By: Agustin Cree M.D.   On: 04/12/2023 08:58    Procedures .Sedation  Date/Time: 04/12/2023 9:05 AM  Performed by: Terrilee Files, MD Authorized by: Terrilee Files, MD   Consent:    Consent obtained:  Verbal and written   Consent given by:  Patient   Risks discussed:  Allergic reaction, dysrhythmia, inadequate sedation, nausea, prolonged hypoxia resulting in organ damage, prolonged sedation necessitating reversal, respiratory compromise necessitating ventilatory assistance and intubation and vomiting   Alternatives discussed:  Analgesia without sedation, anxiolysis and regional  anesthesia Universal protocol:    Procedure explained and questions answered to patient or proxy's satisfaction: yes     Relevant documents present and verified: yes     Test results available: yes     Imaging studies available: yes     Required blood products, implants, devices, and special equipment available: yes     Site/side marked: yes     Immediately prior to procedure, a time out was called: yes     Patient identity confirmed:  Verbally with patient Indications:    Procedure necessitating sedation performed by:  Physician performing sedation Pre-sedation assessment:    Time since last food or drink:  12   ASA classification: class 1 - normal, healthy patient     Mouth opening:  3 or more finger widths   Thyromental distance:  4 finger widths   Mallampati score:  I - soft palate, uvula, fauces, pillars visible   Neck mobility: normal     Pre-sedation assessments completed and reviewed: airway patency, cardiovascular function, hydration status, mental status, nausea/vomiting, pain level, respiratory  function and temperature   A pre-sedation assessment was completed prior to the start of the procedure Immediate pre-procedure details:    Reassessment: Patient reassessed immediately prior to procedure     Reviewed: vital signs, relevant labs/tests and NPO status     Verified: bag valve mask available, emergency equipment available, intubation equipment available, IV patency confirmed, oxygen available and suction available   Procedure details (see MAR for exact dosages):    Preoxygenation:  Nasal cannula   Sedation:  Propofol   Intended level of sedation: deep   Intra-procedure monitoring:  Blood pressure monitoring, cardiac monitor, continuous pulse oximetry, frequent LOC assessments, frequent vital sign checks and continuous capnometry   Intra-procedure events: none     Total Provider sedation time (minutes):  15 Post-procedure details:   A post-sedation assessment was completed  following the completion of the procedure.   Attendance: Constant attendance by certified staff until patient recovered     Recovery: Patient returned to pre-procedure baseline     Post-sedation assessments completed and reviewed: airway patency, cardiovascular function, hydration status, mental status, nausea/vomiting, pain level, respiratory function and temperature     Patient is stable for discharge or admission: yes     Procedure completion:  Tolerated well, no immediate complications .Reduction of dislocation  Date/Time: 04/12/2023 9:05 AM  Performed by: Terrilee Files, MD Authorized by: Terrilee Files, MD  Consent: Written consent obtained. Consent given by: patient Patient understanding: patient states understanding of the procedure being performed Patient consent: the patient's understanding of the procedure matches consent given Procedure consent: procedure consent matches procedure scheduled Relevant documents: relevant documents present and verified Test results: test results available and properly labeled Imaging studies: imaging studies available Required items: required blood products, implants, devices, and special equipment available Patient identity confirmed: arm band Time out: Immediately prior to procedure a "time out" was called to verify the correct patient, procedure, equipment, support staff and site/side marked as required.  Sedation: Patient sedated: yes  Patient tolerance: patient tolerated the procedure well with no immediate complications       Medications Ordered in ED Medications  morphine (PF) 4 MG/ML injection 4 mg (4 mg Intravenous Given 04/12/23 0725)  propofol (DIPRIVAN) 10 mg/mL bolus/IV push 40.8 mg (60 mg Intravenous Given 04/12/23 0854)  propofol (DIPRIVAN) 10 mg/mL bolus/IV push (30 mg Intravenous Given 04/12/23 0900)    ED Course/ Medical Decision Making/ A&P Clinical Course as of 04/12/23 1704  Thu Apr 12, 2023  0832 X-ray showing  anterior inferior right shoulder dislocation.  Will proceed with conscious sedation. [MB]  0930 Repeat x-ray showing successful reduction.  Patient awake alert [MB]    Clinical Course User Index [MB] Terrilee Files, MD                                 Medical Decision Making Amount and/or Complexity of Data Reviewed Radiology: ordered.  Risk Prescription drug management.   This patient complains of right shoulder pain; this involves an extensive number of treatment Options and is a complaint that carries with it a high risk of complications and morbidity. The differential includes dislocation, fracture, contusion, rotator cuff  I ordered medication IV pain medication and procedural sedation and reviewed PMP when indicated. I ordered imaging studies which included right shoulder x-ray and I independently    visualized and interpreted imaging which showed anterior dislocation Previous records obtained and reviewed in epic,  patient has been seen before for dislocation Cardiac monitoring reviewed, normal sinus rhythm Social determinants considered, no significant barriers Critical Interventions: None  After the interventions stated above, I reevaluated the patient and found patient to be improved in his symptoms after reduction Admission and further testing considered, no indications for admission at this time.  Given contact information for outpatient orthopedics.  Return instructions discussed.         Final Clinical Impression(s) / ED Diagnoses Final diagnoses:  Dislocation of right shoulder joint, initial encounter    Rx / DC Orders ED Discharge Orders     None         Terrilee Files, MD 04/12/23 [phone]

## 2023-04-12 NOTE — ED Triage Notes (Signed)
 Brought in with leo from [facility]. Cc of right shoulder dislocation. Out of place since 0515. Hx of same. Can never get it in on his own, always has to be seen.

## 2023-04-12 NOTE — Discharge Instructions (Signed)
 You were seen in the emergency department for right shoulder dislocation.  You were given sedation and closed reduction of your shoulder was performed.  Please use sling except when bathing and sleeping.  Follow-up with orthopedics.  Ibuprofen for pain as needed.  Return if any worsening or concerning symptoms

## 2023-04-12 NOTE — ED Notes (Signed)
 Provider at bedside for shoulder reduction.    Time out done at this time

## 2023-04-17 ENCOUNTER — Telehealth: Payer: Self-pay | Admitting: Orthopedic Surgery

## 2023-04-17 NOTE — Telephone Encounter (Signed)
 Returned a call to Ms. Hurdle at the correctional facility [phone] ext [phone] regarding scheduling this pt.  She was not there and they were not sure that she'll be in today.  They will have her call me back to schedule.  Pt went to the ED at [facility] on 4/03 for his rt shoulder and had x-rays.

## 2023-05-22 ENCOUNTER — Ambulatory Visit: Admitting: Orthopedic Surgery

## 2023-05-31 ENCOUNTER — Encounter: Payer: Self-pay | Admitting: Orthopedic Surgery

## 2023-05-31 ENCOUNTER — Ambulatory Visit (INDEPENDENT_AMBULATORY_CARE_PROVIDER_SITE_OTHER): Admitting: Orthopedic Surgery

## 2023-05-31 VITALS — BP 123/88 | HR 76 | Ht 70.0 in | Wt 180.0 lb

## 2023-05-31 DIAGNOSIS — M24411 Recurrent dislocation, right shoulder: Secondary | ICD-10-CM | POA: Diagnosis not present

## 2023-05-31 NOTE — Progress Notes (Signed)
  Intake history:  BP 123/88   Pulse 76   Ht 5\' 10"  (1.778 m)   Wt 180 lb (81.6 kg)   BMI 25.83 kg/m  Body mass index is 25.83 kg/m.    WHAT ARE WE SEEING YOU FOR TODAY?   right shoulder  How long has this bothered you? (DOI?DOS?WS?)  on 04/12/23  Anticoag.  No  Diabetes No  Heart disease No  Hypertension Yes  SMOKING HX No  Kidney disease     Latest Ref Rng & Units 09/02/2022    7:17 AM 09/01/2022    4:57 AM 08/31/2022    2:47 PM  CMP  Glucose 70 - 99 mg/dL 960  454  098   BUN 6 - 20 mg/dL 10  23  26    Creatinine 0.61 - 1.24 mg/dL 1.19  1.47  8.29   Sodium 135 - 145 mmol/L 132  135  140   Potassium 3.5 - 5.1 mmol/L 3.3  3.6  3.8   Chloride 98 - 111 mmol/L 100  97  89   CO2 22 - 32 mmol/L 24  27  20    Calcium  8.9 - 10.3 mg/dL 7.9  8.0  9.2   Total Protein 6.5 - 8.1 g/dL 5.8   7.2   Total Bilirubin 0.3 - 1.2 mg/dL 1.2   1.2   Alkaline Phos 38 - 126 U/L 42   58   AST 15 - 41 U/L 31   27   ALT 0 - 44 U/L 18   18      Any ALLERGIES ______________________________________________   Treatment:  Have you taken:  Tylenol  No  Advil  No  Had PT No  Had injection No  Other  _______________here stats he needs surgery __________

## 2023-05-31 NOTE — Progress Notes (Signed)
 Office Visit Note   Patient: Alex Logan           Date of Birth: 01/25/1965           MRN: [id number] Visit Date: 05/31/2023 Requested by: Center, [facility] [address] [place],  [place] [address] PCP: Center, [facility]   Assessment & Plan:   Encounter Diagnosis  Name Primary?   Recurrent shoulder dislocation, right Yes    No orders of the defined types were placed in this encounter.   59 year old male current inmate history of recurrent dislocations of the right shoulder  The patient appears to need surgical intervention to stabilize his shoulder  He will see our shoulder specialist for evaluation and management     Subjective: Chief Complaint  Patient presents with   Shoulder Pain    Right states "needs surgery" dislocated on 04/12/23    HPI: 58 year old male dislocated his right shoulder about 5 years ago.  He says he was pulling down on an object which was overhead and when he pulled it his shoulder came out of place he has had multiple dislocations since that time.  He has even had dislocations of the right shoulder when he rolled over onto his shoulder.  He was recently seen in the emergency room April 3 requiring closed reduction  He was seen by Dr. Deeann Fare in [place] many years ago and was scheduled for surgery, however he was unable to complete that.  He has had multiple images and a CT scan of his shoulder              ROS: He denies any radicular pain he says occasionally when his arm is flexed at the elbow he will get some numbness and tingling in his fingers   Images personally read and my interpretation :    Image #1 on April 3 right shoulder dislocated shoulder  Image #2 also on April 3 reduction of shoulder  Image 3 Hill-Sachs deformity seen on CT scan and bony Bankart as well  Hears the report from radiology  FINDINGS: Bones/Joint/Cartilage   3 x 10 x 19 mm anterior inferior glenoid bone fragment medially displaced  over the anterior glenoid. This is likely chronic as the fragment and glenoid defect appear well corticated. Chronic depression deformity of the posterosuperior humeral head. No dislocation. Mild acromioclavicular and glenohumeral asymmetric joint space narrowing with small marginal osteophytes. Small glenohumeral joint effusion.   Ligaments   Ligaments are suboptimally evaluated by CT.   Muscles and Tendons Grossly intact.  No muscle atrophy.   Soft tissue No fluid collection or hematoma. No soft tissue mass. The visualized right lung is clear.   IMPRESSION: 1. 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 2. Mild acromioclavicular and glenohumeral osteoarthritis.     Electronically Signed   By: Aleta Anda M.D.   On: 09/06/2022 09:47  Visit Diagnoses:  1. Recurrent shoulder dislocation, right      Follow-Up Instructions: Return for to see Dr Ernesta Heading / for surgical consult on Right shoulder /inmate. , REFERRAL, DR Ernesta Heading.    Objective: Vital Signs: BP 123/88   Pulse 76   Ht 5\' 10"  (1.778 m)   Wt 180 lb (81.6 kg)   BMI 25.83 kg/m   Physical Exam Vitals and nursing note reviewed.  Constitutional:      Appearance: Normal appearance.  HENT:     Head: Normocephalic and atraumatic.  Eyes:  General: No scleral icterus.       Right eye: No discharge.        Left eye: No discharge.     Extraocular Movements: Extraocular movements intact.     Conjunctiva/sclera: Conjunctivae normal.     Pupils: Pupils are equal, round, and reactive to light.  Cardiovascular:     Rate and Rhythm: Normal rate.     Pulses: Normal pulses.  Skin:    General: Skin is warm and dry.     Capillary Refill: Capillary refill takes less than 2 seconds.  Neurological:     General: No focal deficit present.     Mental Status: He is alert and oriented to person, place, and time.  Psychiatric:        Mood  and Affect: Mood normal.        Behavior: Behavior normal.        Thought Content: Thought content normal.        Judgment: Judgment normal.      Ortho Exam  His hands are cuffed so it is difficult to evaluate him completely however he seems to have a strong rotator cuff in abduction and flexion  I could not get him in the dislocating position because of the handcuffs  His neurovascular exam was normal Specialty Comments:  No specialty comments available.  Imaging: No results found.   PMFS History: Patient Active Problem List   Diagnosis Date Noted   Hiccups 05/30/2022   Alcohol withdrawal delirium, acute, hypoactive (HCC) 05/30/2022   AKI (acute kidney injury) (HCC) 02/17/2022   Thrombocytopenia (HCC) 08/11/2020   Anxiety 06/26/2020   Tachycardia 01/14/2020   Elevated LFTs 05/27/2017   Muscular abdominal pain in right flank 05/27/2017   Fatty liver, alcoholic 05/27/2017   Depression with anxiety 05/27/2017   Pancytopenia (HCC) 05/27/2017   SVT (supraventricular tachycardia) (HCC)    HTN (hypertension) 05/23/2017   Acute vomiting    Pressure injury of skin 11/12/2016   Alcohol withdrawal (HCC) 11/10/2016   SIRS (systemic inflammatory response syndrome) (HCC) 11/10/2016   High anion gap metabolic acidosis 11/10/2016   Lactic acidosis 11/10/2016   Hyponatremia 11/10/2016   Starvation ketoacidosis 11/10/2016   Alcohol use disorder, severe, dependence (HCC) 09/27/2016   Alcohol abuse with alcohol-induced mood disorder (HCC) 09/22/2016   Past Medical History:  Diagnosis Date   Alcohol abuse    Anxiety    Depression    GERD (gastroesophageal reflux disease)    Hypertension     Family History  Problem Relation Age of Onset   Emphysema Mother    COPD Mother    Bipolar disorder Father    Alcohol abuse Father    Hypertension Father    Alcoholism Paternal Grandmother    Alcoholism Paternal Grandfather    Mental illness Neg Hx     Past Surgical History:   Procedure Laterality Date   NO PAST SURGERIES     Social History   Occupational History   Not on file  Tobacco Use   Smoking status: Never   Smokeless tobacco: Never  Vaping Use   Vaping status: Never Used  Substance and Sexual Activity   Alcohol use: Yes    Comment: Chronic ETOH use and abuse   Drug use: No   Sexual activity: Not Currently

## 2023-05-31 NOTE — Patient Instructions (Signed)
 Needs appointment with Dr Ernesta Heading for surgical consult

## 2023-09-21 ENCOUNTER — Other Ambulatory Visit: Payer: Self-pay | Admitting: Internal Medicine

## 2023-09-21 DIAGNOSIS — Z419 Encounter for procedure for purposes other than remedying health state, unspecified: Secondary | ICD-10-CM | POA: Diagnosis not present

## 2023-10-02 ENCOUNTER — Telehealth: Payer: Self-pay | Admitting: Internal Medicine

## 2023-10-02 NOTE — Telephone Encounter (Signed)
 Returned call to patient & he continues to have reflux and occasional vomiting. He's currently on nexium  40 mg BID. He was last seen with Dr. Federico for OV 09/05/22 & was recommended to have an EGD. Stated he recently got out of prison & has been unable to schedule an appointment.

## 2023-10-02 NOTE — Telephone Encounter (Signed)
Pt made aware of appt.  °

## 2023-10-02 NOTE — Telephone Encounter (Signed)
 Inbound call from patient requesting a call back from  nurse. He states he would like speak with the nurse. He was seen in 2024 and was advised to scheduled a EGD. Patient is still currently having reflux and sometimes have issues with vomiting. Please advise.

## 2023-10-02 NOTE — Telephone Encounter (Signed)
 Left message for patient to call back. Pt will need OV for rescheduling. OV scheduled for 9/30 at 3:00 pm with Cathryne, NP.

## 2023-10-04 DIAGNOSIS — R7401 Elevation of levels of liver transaminase levels: Secondary | ICD-10-CM | POA: Diagnosis not present

## 2023-10-04 DIAGNOSIS — E7800 Pure hypercholesterolemia, unspecified: Secondary | ICD-10-CM | POA: Diagnosis not present

## 2023-10-04 DIAGNOSIS — K219 Gastro-esophageal reflux disease without esophagitis: Secondary | ICD-10-CM | POA: Diagnosis not present

## 2023-10-04 DIAGNOSIS — R7303 Prediabetes: Secondary | ICD-10-CM | POA: Diagnosis not present

## 2023-10-04 DIAGNOSIS — I10 Essential (primary) hypertension: Secondary | ICD-10-CM | POA: Diagnosis not present

## 2023-10-09 ENCOUNTER — Ambulatory Visit (INDEPENDENT_AMBULATORY_CARE_PROVIDER_SITE_OTHER): Admitting: Gastroenterology

## 2023-10-09 ENCOUNTER — Encounter: Payer: Self-pay | Admitting: Gastroenterology

## 2023-10-09 VITALS — BP 110/80 | HR 66 | Ht 70.0 in | Wt 173.4 lb

## 2023-10-09 DIAGNOSIS — R112 Nausea with vomiting, unspecified: Secondary | ICD-10-CM

## 2023-10-09 DIAGNOSIS — K219 Gastro-esophageal reflux disease without esophagitis: Secondary | ICD-10-CM | POA: Diagnosis not present

## 2023-10-09 DIAGNOSIS — Z7689 Persons encountering health services in other specified circumstances: Secondary | ICD-10-CM | POA: Diagnosis not present

## 2023-10-09 NOTE — Progress Notes (Signed)
 Chief Complaint:GERD, vomiting Primary GI Doctor: Dr. Federico  HPI:  58 year old male with history of alcohol use, anxiety/depression, GERD, and HFpEF presents with GERD  Last seen in GI office by Dr. Federico on 09/05/22.  Interval History  Patient presents for evaluation of GERD with nausea and vomiting. Patient has history of GERD and currently on Nexium  40 mg BID.  Patient reports carbohydrates and coffee increase his reflux symptoms. Patient denies dysphagia. Appetite good. Weight stable.   Patient  has intermittent episodes of nausea. He vomit sometimes. Last episode few weeks ago. No hematemesis.   OTC Ibuprofen  prn.   No tobacco use. History of alcohol abuse, reports he is no longer drinking.  Patient denies altered bowel habits, abdominal pain, or rectal bleeding  Last colonoscopy was 4-5 years ago that was normal, due in 10 years.  Patient has never had EGD.  Recently had lab work that showed prediabetes and slightly elevated cholesterol.   Patient's family history : no history of CA  Wt Readings from Last 3 Encounters:  10/09/23 173 lb 6 oz (78.6 kg)  05/31/23 180 lb (81.6 kg)  09/05/22 174 lb 4 oz (79 kg)   Past Medical History:  Diagnosis Date   Alcohol abuse    Anxiety    Depression    GERD (gastroesophageal reflux disease)    Hypertension    Past Surgical History:  Procedure Laterality Date   NO PAST SURGERIES     Current Outpatient Medications  Medication Sig Dispense Refill   escitalopram  (LEXAPRO ) 20 MG tablet Take 10 mg by mouth daily.     esomeprazole  (NEXIUM ) 40 MG capsule TAKE 1 CAPSULE BY MOUTH 2 TIMES A DAY 60 capsule 2   lisinopril  (ZESTRIL ) 20 MG tablet Take 20 mg by mouth daily.     propranolol  ER (INDERAL  LA) 80 MG 24 hr capsule Take 1 capsule (80 mg total) by mouth daily. 30 capsule 1   No current facility-administered medications for this visit.   Allergies as of 10/09/2023   (No Known Allergies)   Family History  Problem Relation  Age of Onset   Emphysema Mother    COPD Mother    Bipolar disorder Father    Alcohol abuse Father    Hypertension Father    Alcoholism Paternal Grandmother    Alcoholism Paternal Grandfather    Mental illness Neg Hx    Colon cancer Neg Hx    Esophageal cancer Neg Hx    Pancreatic cancer Neg Hx    Stomach cancer Neg Hx    Review of Systems:    Constitutional: No weight loss, fever, chills, weakness or fatigue HEENT: Eyes: No change in vision               Ears, Nose, Throat:  No change in hearing or congestion Skin: No rash or itching Cardiovascular: No chest pain, chest pressure or palpitations   Respiratory: No SOB or cough Gastrointestinal: See HPI and otherwise negative Genitourinary: No dysuria or change in urinary frequency Neurological: No headache, dizziness or syncope Musculoskeletal: No new muscle or joint pain Hematologic: No bleeding or bruising Psychiatric: No history of depression or anxiety   Physical Exam:  Vital signs: BP 110/80   Pulse 66   Ht 5' 10 (1.778 m)   Wt 173 lb 6 oz (78.6 kg)   SpO2 99%   BMI 24.88 kg/m   Constitutional:   Pleasant male appears to be in NAD, Well developed, Well nourished, alert and  cooperative Throat: Oral cavity and pharynx without inflammation, swelling or lesion.  Respiratory: Respirations even and unlabored. Lungs clear to auscultation bilaterally.   No wheezes, crackles, or rhonchi.  Cardiovascular: Normal S1, S2. Regular rate and rhythm. No peripheral edema, cyanosis or pallor.  Gastrointestinal:  Soft, nondistended, nontender. No rebound or guarding. Normal bowel sounds. No appreciable masses or hepatomegaly. Rectal:  Not performed.  Msk:  Symmetrical without gross deformities. Without edema, no deformity or joint abnormality.  Neurologic:  Alert and  oriented x4;  grossly normal neurologically.  Skin:   Dry and intact without significant lesions or rashes.  RELEVANT LABS AND IMAGING: CBC    Latest Ref Rng & Units  09/02/2022    7:17 AM 09/01/2022    4:57 AM 08/31/2022    2:47 PM  CBC  WBC 4.0 - 10.5 K/uL 7.9  15.3  15.1   Hemoglobin 13.0 - 17.0 g/dL 87.8  86.4  83.5   Hematocrit 39.0 - 52.0 % 35.9  39.5  46.3   Platelets 150 - 400 K/uL 192  260  405      CMP     Latest Ref Rng & Units 09/02/2022    7:17 AM 09/01/2022    4:57 AM 08/31/2022    2:47 PM  CMP  Glucose 70 - 99 mg/dL 883  866  863   BUN 6 - 20 mg/dL 10  23  26    Creatinine 0.61 - 1.24 mg/dL 9.19  8.52  6.64   Sodium 135 - 145 mmol/L 132  135  140   Potassium 3.5 - 5.1 mmol/L 3.3  3.6  3.8   Chloride 98 - 111 mmol/L 100  97  89   CO2 22 - 32 mmol/L 24  27  20    Calcium  8.9 - 10.3 mg/dL 7.9  8.0  9.2   Total Protein 6.5 - 8.1 g/dL 5.8   7.2   Total Bilirubin 0.3 - 1.2 mg/dL 1.2   1.2   Alkaline Phos 38 - 126 U/L 42   58   AST 15 - 41 U/L 31   27   ALT 0 - 44 U/L 18   18      Lab Results  Component Value Date   TSH 1.355 02/17/2022  Labs 08/2022: CBC with low Hb of 12.1 and platelets of 192. CMP with low Na of 132 and low K of 3.3. LFTs nml. INR nml. Ethanol level elevated at 210.   05/2017 echo- EF with 65-70%.   CT A/P w/contrast 05/23/17: IMPRESSION: Severe diffuse fatty infiltration of the liver. Small single layering gallstone. No CT evidence for cholecystitis. No visible biliary stone or dilatation. Normal appendix. Mildly prominent prostate. Scattered aortic calcifications.   Assessment: Encounter Diagnoses  Name Primary?   Gastroesophageal reflux disease, unspecified whether esophagitis present Yes   Nausea and vomiting, unspecified vomiting type     58 year old male patient who presents with uncontrolled GERD with nausea and vomiting despite being on Nexium  twice daily.  Occasional NSAID use.  Will go ahead and schedule EGD in [facility] with Dr. San.  Recommended strict GERD diet and avoid all NSAIDs.   Patient up-to-date on colon screening colonoscopy, next due in 5 years.  Plan: - continue Nexium  40 mg twice  daily  -Continue GERD diet -Schedule EGD in [facility] with Dr. San. The risks and benefits of EGD with possible biopsies and esophageal dilation were discussed with the patient who agrees to proceed.  Thank you for the  courtesy of this consult. Please call me with any questions or concerns.   Amariz Flamenco, FNP-C [facility] 10/09/2023, 3:22 PM  Cc: Center, [facility]

## 2023-10-09 NOTE — Patient Instructions (Addendum)
 Recommend GERD diet, no late meals Continue Esomeprazole  40 mg twice daily  Avoid NSAIDs (advil , Ibuprofen , motrin )  You have been scheduled for an endoscopy. Please follow written instructions given to you at your visit today.  If you use inhalers (even only as needed), please bring them with you on the day of your procedure.  If you take any of the following medications, they will need to be adjusted prior to your procedure:   DO NOT TAKE 7 DAYS PRIOR TO TEST- Trulicity (dulaglutide) Ozempic, Wegovy (semaglutide) Mounjaro (tirzepatide) Bydureon Bcise (exanatide extended release)  DO NOT TAKE 1 DAY PRIOR TO YOUR TEST Rybelsus (semaglutide) Adlyxin (lixisenatide) Victoza (liraglutide) Byetta (exanatide) ___________________________________________________________________________  Due to recent changes in healthcare laws, you may see the results of your imaging and laboratory studies on MyChart before your provider has had a chance to review them.  We understand that in some cases there may be results that are confusing or concerning to you. Not all laboratory results come back in the same time frame and the provider may be waiting for multiple results in order to interpret others.  Please give us  48 hours in order for your provider to thoroughly review all the results before contacting the office for clarification of your results.   _______________________________________________________  If your blood pressure at your visit was 140/90 or greater, please contact your primary care physician to follow up on this.  _______________________________________________________  If you are age 57 or older, your body mass index should be between 23-30. Your Body mass index is 24.88 kg/m. If this is out of the aforementioned range listed, please consider follow up with your Primary Care Provider.  If you are age 3 or younger, your body mass index should be between 19-25. Your Body mass index is  24.88 kg/m. If this is out of the aformentioned range listed, please consider follow up with your Primary Care Provider.   ________________________________________________________  The [facility] providers would like to encourage you to use MYCHART to communicate with providers for non-urgent requests or questions.  Due to long hold times on the telephone, sending your provider a message by [facility] may be a faster and more efficient way to get a response.  Please allow 48 business hours for a response.  Please remember that this is for non-urgent requests.  _______________________________________________________  [facility] is using a team-based approach to care.  Your team is made up of your doctor and two to three APPS. Our APPS (Nurse Practitioners and Physician Assistants) work with your physician to ensure care continuity for you. They are fully qualified to address your health concerns and develop a treatment plan. They communicate directly with your gastroenterologist to care for you. Seeing the Advanced Practice Practitioners on your physician's team can help you by facilitating care more promptly, often allowing for earlier appointments, access to diagnostic testing, procedures, and other specialty referrals.   Thank you for trusting me with your gastrointestinal care. Deanna May, FNP-C

## 2023-10-11 DIAGNOSIS — M25311 Other instability, right shoulder: Secondary | ICD-10-CM | POA: Diagnosis not present

## 2023-10-11 DIAGNOSIS — G8918 Other acute postprocedural pain: Secondary | ICD-10-CM | POA: Diagnosis not present

## 2023-10-11 DIAGNOSIS — M24411 Recurrent dislocation, right shoulder: Secondary | ICD-10-CM | POA: Diagnosis not present

## 2023-10-11 DIAGNOSIS — H5213 Myopia, bilateral: Secondary | ICD-10-CM | POA: Diagnosis not present

## 2023-10-11 DIAGNOSIS — M85811 Other specified disorders of bone density and structure, right shoulder: Secondary | ICD-10-CM | POA: Diagnosis not present

## 2023-10-15 HISTORY — PX: SHOULDER SURGERY: SHX246

## 2023-10-18 NOTE — Progress Notes (Signed)
 Agree with the assessment and plan as outlined by [facility], FNP-C.  Carlitos Bottino, DO, [facility]

## 2023-10-21 DIAGNOSIS — Z419 Encounter for procedure for purposes other than remedying health state, unspecified: Secondary | ICD-10-CM | POA: Diagnosis not present

## 2023-10-26 DIAGNOSIS — Z9889 Other specified postprocedural states: Secondary | ICD-10-CM | POA: Diagnosis not present

## 2023-10-29 ENCOUNTER — Other Ambulatory Visit: Payer: Self-pay

## 2023-10-29 ENCOUNTER — Encounter: Payer: Self-pay | Admitting: Physical Therapy

## 2023-10-29 ENCOUNTER — Ambulatory Visit: Attending: Surgical | Admitting: Physical Therapy

## 2023-10-29 DIAGNOSIS — M25511 Pain in right shoulder: Secondary | ICD-10-CM | POA: Diagnosis not present

## 2023-10-29 DIAGNOSIS — G8929 Other chronic pain: Secondary | ICD-10-CM | POA: Diagnosis not present

## 2023-10-29 DIAGNOSIS — M25611 Stiffness of right shoulder, not elsewhere classified: Secondary | ICD-10-CM | POA: Insufficient documentation

## 2023-10-29 DIAGNOSIS — M6281 Muscle weakness (generalized): Secondary | ICD-10-CM | POA: Diagnosis not present

## 2023-10-29 DIAGNOSIS — R293 Abnormal posture: Secondary | ICD-10-CM | POA: Insufficient documentation

## 2023-10-29 NOTE — Therapy (Signed)
 OUTPATIENT PHYSICAL THERAPY UPPER EXTREMITY EVALUATION   Patient Name: Alex Logan MRN: [id number] DOB:Feb 08, 1965, 58 y.o., male Today's Date: 10/29/2023  END OF SESSION:  PT End of Session - 10/29/23 1321     Visit Number 1    Date for Recertification  12/28/23    Authorization Type [place] Medicaid [employer] (auth request) 27 VL    PT Start Time 1156    PT Stop Time 1236    PT Time Calculation (min) 40 min    Activity Tolerance Patient tolerated treatment well    Behavior During Therapy WFL for tasks assessed/performed          Past Medical History:  Diagnosis Date   Alcohol abuse    Anxiety    Depression    GERD (gastroesophageal reflux disease)    Hypertension    Past Surgical History:  Procedure Laterality Date   NO PAST SURGERIES     Patient Active Problem List   Diagnosis Date Noted   Hiccups 05/30/2022   Alcohol withdrawal delirium, acute, hypoactive (HCC) 05/30/2022   AKI (acute kidney injury) 02/17/2022   Thrombocytopenia 08/11/2020   Anxiety 06/26/2020   Tachycardia 01/14/2020   Elevated LFTs 05/27/2017   Muscular abdominal pain in right flank 05/27/2017   Fatty liver, alcoholic 05/27/2017   Depression with anxiety 05/27/2017   Pancytopenia (HCC) 05/27/2017   SVT (supraventricular tachycardia)    HTN (hypertension) 05/23/2017   Acute vomiting    Pressure injury of skin 11/12/2016   Alcohol withdrawal (HCC) 11/10/2016   SIRS (systemic inflammatory response syndrome) (HCC) 11/10/2016   High anion gap metabolic acidosis 11/10/2016   Lactic acidosis 11/10/2016   Hyponatremia 11/10/2016   Starvation ketoacidosis 11/10/2016   Alcohol use disorder, severe, dependence (HCC) 09/27/2016   Alcohol abuse with alcohol-induced mood disorder (HCC) 09/22/2016    PCP: None  REFERRING PROVIDER:    Brendan Gauze, PA-C    REFERRING DIAG: [phone] status post shoulder surgery   THERAPY DIAG:  Chronic right shoulder pain  Stiffness of right shoulder,  not elsewhere classified  Abnormal posture  Muscle weakness (generalized)  Rationale for Evaluation and Treatment: Rehabilitation  ONSET DATE: Surgery Date Oct 3 , 2025  SUBJECTIVE:                                                                                                                                                                                      SUBJECTIVE STATEMENT: Patient presents status post right shoulder coracoid transfer on Oct 12, 2023. He is supposed to be in the sling until Oct 31st, but he does not have it on today. He had a follow up appointment 10/26/2023 and  everything is healing well. He dislocated it shoulder 5 years ago from work accident, and the injury just kept getting worse.  Hand dominance: Right  PERTINENT HISTORY: HTN; Anxiety; Depression  PAIN:  Are you having pain? Yes: NPRS scale: only shoulder discomfort with elevation but it is mild Pain location: coracoid process  Pain description: discomfort Aggravating factors: reaching Relieving factors: Tylenol    PRECAUTIONS: Shoulder see protocol in media  RED FLAGS: None   WEIGHT BEARING RESTRICTIONS: No  FALLS:  Has patient fallen in last 6 months? No  LIVING ENVIRONMENT: Lives with: lives alone Lives in: House/apartment Stairs: Yes: Internal: 12 steps; on right going up   OCCUPATION: Not currently working- Works at [employer] (standing, bending, lifting )  PLOF: Independent, Independent with basic ADLs, Independent with household mobility without device, Independent with community mobility without device, Independent with gait, Independent with transfers, and Leisure: Walk  PATIENT GOALS: To get back to normal activities  NEXT MD VISIT: Week of Nov 10th   OBJECTIVE:  Note: Objective measures were completed at Evaluation unless otherwise noted.  DIAGNOSTIC FINDINGS:  04/12/2023 FINDINGS: Anterior-inferior dislocation of the right humeral head relative to the glenoid. No acute  displaced fracture. There is no evidence of arthropathy or other focal bone abnormality. Soft tissues are unremarkable. Old fracture of the right posterior fifth rib.   IMPRESSION: Anterior right shoulder dislocation.  PATIENT SURVEYS :  Quick Dash: 31.8/100 31.8%  Minimally Clinically Important Difference (MCID): 15-20 points   COGNITION: Overall cognitive status: Within functional limits for tasks assessed     SENSATION: WFL  POSTURE: Rounded shoulders & forward head  UPPER EXTREMITY ROM: PROM : shoulder flexion in scapular plane to 90 degrees (no pain or restriction), Mild discomfort with PROM to ER to neutral  UPPER EXTREMITY MMT:   MMT Right eval Left eval  Shoulder flexion    Shoulder extension    Shoulder abduction    Shoulder adduction    Shoulder internal rotation    Shoulder external rotation    Middle trapezius    Lower trapezius    Elbow flexion    Elbow extension    Wrist flexion    Wrist extension    Wrist ulnar deviation    Wrist radial deviation    Wrist pronation    Wrist supination    Grip strength (lbs) 90 82  (Blank rows = not tested)   PALPATION:  Increased muscle spasms right biceps Increased muscle guarding to right shoulder PROM                                                                                                                             TREATMENT DATE:  10/29/2023 Initial Evaluation & HEP created Plan for therapy per protocols. Importance of wearing sling until 10/31.  If treatment provided at initial evaluation, no treatment charged due to lack of authorization.       PATIENT EDUCATION: Education details: PT eval findings,  anticipated POC, progress with PT, and initial HEP Person educated: Patient Education method: Explanation, Demonstration, and Handouts Education comprehension: verbalized understanding, returned demonstration, and needs further education  HOME EXERCISE PROGRAM: Access Code: [email or web address] URL:  [email or web address] Date: 10/29/2023 Prepared by: Kristeen Sar  Exercises - Seated Scapular Retraction  - 1 x daily - 7 x weekly - 1 sets - 10 reps - Seated Elbow Flexion and Extension AROM  - 1 x daily - 7 x weekly - 1 sets - 10 reps - Wrist Extension AROM  - 1 x daily - 7 x weekly - 1 sets - 10 reps - Isometric Shoulder Abduction at Wall  - 1 x daily - 7 x weekly - 1-2 sets - 5 reps - 5s hold  ASSESSMENT:  CLINICAL IMPRESSION: Patient is a 58 y.o. male who was seen today for physical therapy evaluation and treatment for right shoulder stiffness status post right shoulder coracoid transfer. Braelon presents with a history of a right shoulder dislocation that happened at work 5 years ago. Over the years his injury got worse and it restricted his function. His surgery date was Oct 3rd and based on protocol he to wear the sling until Oct 31st. Patient did not have on the sling today and educated him on the importance of wearing until next week. Noted increased muscle guarding with right shoulder PROM, but motions were not painful and no increased restrictions were noted. Patient is not currently working, but his job duties required bending, lifting and stooping. Patient is highly motivated and wants to get back to his normal function. Patient will benefit from skilled PT to address the below impairments and improve overall function.    OBJECTIVE IMPAIRMENTS: decreased ROM, decreased strength, hypomobility, increased muscle spasms, impaired flexibility, impaired UE functional use, and pain.   ACTIVITY LIMITATIONS: carrying, lifting, bending, sleeping, dressing, reach over head, and hygiene/grooming  PARTICIPATION LIMITATIONS: meal prep, cleaning, laundry, community activity, and occupation  PERSONAL FACTORS: Fitness and 3+ comorbidities: HTN; Anxiety; Depression are also affecting patient's functional outcome.   REHAB POTENTIAL: Good  CLINICAL DECISION MAKING: Evolving/moderate  complexity  EVALUATION COMPLEXITY: Low  GOALS: Goals reviewed with patient? Yes  SHORT TERM GOALS: Target date: 11/26/2023  Patient will be independent with initial HEP. Baseline:  Goal status: INITIAL  2.  .  Patient will demonstrate > or = to 110 degrees in Rt shoulder P/ROM for improved reaching. Baseline:  Goal status: INITIAL   3.  Patient will report > or = to 30% use of Rt UE with light home and self care tasks due to improved ROM and strength. Baseline:  Goal status: INITIAL    LONG TERM GOALS: Target date: 12/28/2023  Patient will demonstrate independence in advanced HEP. Baseline:  Goal status: INITIAL  2.  Patient will report > or = to 70% of normal use of Rt UE for ADLs due to improved functional strength and A/ROM. Baseline:  Goal status: INITIAL  3. .  Patient will demonstrate > or = to 115 degrees in Rt shoulder A/ROM for improved reaching overhead. Baseline:  Goal status: INITIAL   4.  Patient will demonstrate correct bending and lifting technique for safe return to work with no limitations.  Baseline:  Goal status: INITIAL  5.  Patient will score > or = to 46/100 on Quick DASH due to improved right shoulder function. Baseline:  Goal status: INITIAL   PLAN: PT FREQUENCY: 2x/week  PT DURATION: 8 weeks  PLANNED INTERVENTIONS: 02835- PT Re-evaluation, 97110-Therapeutic  exercises, 97530- Therapeutic activity, [id number]- Neuromuscular re-education, [phone]- Self Care, 02859- Manual therapy, [phone]- Canalith repositioning, [id number]- Aquatic Therapy, [phone]- Electrical stimulation (unattended), [phone]- Electrical stimulation (manual), [id number]- Vasopneumatic device, [id number]- Ultrasound, [id number]- Traction (mechanical), [id number]- Ionotophoresis 4mg /ml Dexamethasone, 20560 (1-2 muscles), 20561 (3+ muscles)- Dry Needling, Patient/Family education, Balance training, Stair training, Taping, Joint mobilization, Joint manipulation, Spinal manipulation, Spinal mobilization, Scar  mobilization, Vestibular training, Cryotherapy, and Moist heat  PLAN FOR NEXT SESSION: Review HEP; follow protocol in media; right shoulder PROM (to 90 degrees); discontinue sling 10/31; start active assisted 10/37    Kristeen Sar, PT, DPT 10/29/23 1:23 PM [facility] [address], [address] [place], [place] [address] Phone # [phone] Fax [phone]

## 2023-10-31 ENCOUNTER — Ambulatory Visit: Admitting: Physical Therapy

## 2023-10-31 ENCOUNTER — Encounter: Payer: Self-pay | Admitting: Physical Therapy

## 2023-10-31 DIAGNOSIS — G8929 Other chronic pain: Secondary | ICD-10-CM | POA: Diagnosis not present

## 2023-10-31 DIAGNOSIS — M6281 Muscle weakness (generalized): Secondary | ICD-10-CM

## 2023-10-31 DIAGNOSIS — M25611 Stiffness of right shoulder, not elsewhere classified: Secondary | ICD-10-CM | POA: Diagnosis not present

## 2023-10-31 DIAGNOSIS — M25511 Pain in right shoulder: Secondary | ICD-10-CM | POA: Diagnosis not present

## 2023-10-31 DIAGNOSIS — R293 Abnormal posture: Secondary | ICD-10-CM | POA: Diagnosis not present

## 2023-10-31 NOTE — Therapy (Signed)
 OUTPATIENT PHYSICAL THERAPY UPPER EXTREMITY TREATMENT   Patient Name: Alex Logan MRN: [id number] DOB:1966/01/09, 58 y.o., male Today's Date: 10/31/2023  END OF SESSION:  PT End of Session - 10/31/23 0954     Visit Number 2    Date for Recertification  12/28/23    Authorization Type [place] Medicaid [employer] (auth request) 27 VL    PT Start Time 0954    PT Stop Time 1043    PT Time Calculation (min) 49 min    Activity Tolerance Patient tolerated treatment well    Behavior During Therapy WFL for tasks assessed/performed           Past Medical History:  Diagnosis Date   Alcohol abuse    Anxiety    Depression    GERD (gastroesophageal reflux disease)    Hypertension    Past Surgical History:  Procedure Laterality Date   NO PAST SURGERIES     Patient Active Problem List   Diagnosis Date Noted   Hiccups 05/30/2022   Alcohol withdrawal delirium, acute, hypoactive (HCC) 05/30/2022   AKI (acute kidney injury) 02/17/2022   Thrombocytopenia 08/11/2020   Anxiety 06/26/2020   Tachycardia 01/14/2020   Elevated LFTs 05/27/2017   Muscular abdominal pain in right flank 05/27/2017   Fatty liver, alcoholic 05/27/2017   Depression with anxiety 05/27/2017   Pancytopenia (HCC) 05/27/2017   SVT (supraventricular tachycardia)    HTN (hypertension) 05/23/2017   Acute vomiting    Pressure injury of skin 11/12/2016   Alcohol withdrawal (HCC) 11/10/2016   SIRS (systemic inflammatory response syndrome) (HCC) 11/10/2016   High anion gap metabolic acidosis 11/10/2016   Lactic acidosis 11/10/2016   Hyponatremia 11/10/2016   Starvation ketoacidosis 11/10/2016   Alcohol use disorder, severe, dependence (HCC) 09/27/2016   Alcohol abuse with alcohol-induced mood disorder (HCC) 09/22/2016    PCP: None  REFERRING PROVIDER:    Brendan Gauze, PA-C    REFERRING DIAG: [phone] status post shoulder surgery   THERAPY DIAG:  Chronic right shoulder pain  Stiffness of right  shoulder, not elsewhere classified  Abnormal posture  Muscle weakness (generalized)  Rationale for Evaluation and Treatment: Rehabilitation  ONSET DATE: Surgery Date Oct 3 , 2025  SUBJECTIVE:                                                                                                                                                                                      SUBJECTIVE STATEMENT: I forgot to put my sling on again - I'm stubborn.  I really haven't worn it for about a week.  Eval: Patient presents status post right shoulder coracoid transfer on Oct 12, 2023. He is supposed  to be in the sling until Oct 31st, but he does not have it on today. He had a follow up appointment 10/26/2023 and everything is healing well. He dislocated it shoulder 5 years ago from work accident, and the injury just kept getting worse.  Hand dominance: Right  PERTINENT HISTORY: HTN; Anxiety; Depression  PAIN:  Are you having pain? Yes: NPRS scale: only shoulder discomfort with elevation but it is mild Pain location: coracoid process  Pain description: discomfort Aggravating factors: reaching Relieving factors: Tylenol    PRECAUTIONS: Shoulder see protocol in media  RED FLAGS: None   WEIGHT BEARING RESTRICTIONS: No  FALLS:  Has patient fallen in last 6 months? No  LIVING ENVIRONMENT: Lives with: lives alone Lives in: House/apartment Stairs: Yes: Internal: 12 steps; on right going up   OCCUPATION: Not currently working- Works at [employer] (standing, bending, lifting )  PLOF: Independent, Independent with basic ADLs, Independent with household mobility without device, Independent with community mobility without device, Independent with gait, Independent with transfers, and Leisure: Walk  PATIENT GOALS: To get back to normal activities  NEXT MD VISIT: Week of Nov 10th   OBJECTIVE:  Note: Objective measures were completed at Evaluation unless otherwise noted.  DIAGNOSTIC FINDINGS:   04/12/2023 FINDINGS: Anterior-inferior dislocation of the right humeral head relative to the glenoid. No acute displaced fracture. There is no evidence of arthropathy or other focal bone abnormality. Soft tissues are unremarkable. Old fracture of the right posterior fifth rib.   IMPRESSION: Anterior right shoulder dislocation.  PATIENT SURVEYS :  Quick Dash: 31.8/100 31.8%  Minimally Clinically Important Difference (MCID): 15-20 points   COGNITION: Overall cognitive status: Within functional limits for tasks assessed     SENSATION: WFL  POSTURE: Rounded shoulders & forward head  UPPER EXTREMITY ROM: PROM : shoulder flexion in scapular plane to 90 degrees (no pain or restriction), Mild discomfort with PROM to ER to neutral  UPPER EXTREMITY MMT:   MMT Right eval Left eval  Shoulder flexion    Shoulder extension    Shoulder abduction    Shoulder adduction    Shoulder internal rotation    Shoulder external rotation    Middle trapezius    Lower trapezius    Elbow flexion    Elbow extension    Wrist flexion    Wrist extension    Wrist ulnar deviation    Wrist radial deviation    Wrist pronation    Wrist supination    Grip strength (lbs) 90 82  (Blank rows = not tested)   PALPATION:  Increased muscle spasms right biceps Increased muscle guarding to right shoulder PROM                                                                                                                             TREATMENT DATE:  10/31/23 Pt arrives without sling and admits not using it for about a week - demos what he thought was part  of his HEP and raises Rt UE overhead  Standing scapular retraction 2x10 Rt elbow flexion A/ROM 2x10 Standing Rt shoulder abd isometric 15x5 Standing arm at side 5.0lb digiflex 3x20 reps Seated Rt wrist A/ROM flexion/ext, ulnar/radial dev, circles, sup/pron 2x10 each Supine P/ROM scaption to 90 deg, ER to neutral, elbow extension to end range  Scar  mobilization Rt anterior shoulder scar STM Rt bicep - slightly tight mid-belly and distally Reinforced reasoning behind need for compliance with protection phase to promote and protect tissues for healing in initial post-op stage  10/29/2023 Initial Evaluation & HEP created Plan for therapy per protocols. Importance of wearing sling until 10/31.  If treatment provided at initial evaluation, no treatment charged due to lack of authorization.       PATIENT EDUCATION: Education details: PT eval findings, anticipated POC, progress with PT, and initial HEP Person educated: Patient Education method: Explanation, Demonstration, and Handouts Education comprehension: verbalized understanding, returned demonstration, and needs further education  HOME EXERCISE PROGRAM: Access Code: [email or web address] URL: [email or web address] Date: 10/29/2023 Prepared by: Kristeen Sar  Exercises - Seated Scapular Retraction  - 1 x daily - 7 x weekly - 1 sets - 10 reps - Seated Elbow Flexion and Extension AROM  - 1 x daily - 7 x weekly - 1 sets - 10 reps - Wrist Extension AROM  - 1 x daily - 7 x weekly - 1 sets - 10 reps - Isometric Shoulder Abduction at Wall  - 1 x daily - 7 x weekly - 1-2 sets - 5 reps - 5s hold  ASSESSMENT:  CLINICAL IMPRESSION: Pt admits he has been non-compliant with sling use for about a week. He misunderstood HEP instructions and demo'd A/ROM of elbow combined with shoulder flexion and abduction vs elbow isolation of A/ROM.  PT reiterated reasoning behind protection phase of protocol and encouraged compliance. Pt has excellent pain control, good signs of healing and scar mobility, and is maintaining ROM of distal UE joints.  Eval: Patient is a 58 y.o. male who was seen today for physical therapy evaluation and treatment for right shoulder stiffness status post right shoulder coracoid transfer. Somtochukwu presents with a history of a right shoulder dislocation that happened at work 5  years ago. Over the years his injury got worse and it restricted his function. His surgery date was Oct 3rd and based on protocol he to wear the sling until Oct 31st. Patient did not have on the sling today and educated him on the importance of wearing until next week. Noted increased muscle guarding with right shoulder PROM, but motions were not painful and no increased restrictions were noted. Patient is not currently working, but his job duties required bending, lifting and stooping. Patient is highly motivated and wants to get back to his normal function. Patient will benefit from skilled PT to address the below impairments and improve overall function.    OBJECTIVE IMPAIRMENTS: decreased ROM, decreased strength, hypomobility, increased muscle spasms, impaired flexibility, impaired UE functional use, and pain.   ACTIVITY LIMITATIONS: carrying, lifting, bending, sleeping, dressing, reach over head, and hygiene/grooming  PARTICIPATION LIMITATIONS: meal prep, cleaning, laundry, community activity, and occupation  PERSONAL FACTORS: Fitness and 3+ comorbidities: HTN; Anxiety; Depression are also affecting patient's functional outcome.   REHAB POTENTIAL: Good  CLINICAL DECISION MAKING: Evolving/moderate complexity  EVALUATION COMPLEXITY: Low  GOALS: Goals reviewed with patient? Yes  SHORT TERM GOALS: Target date: 11/26/2023  Patient will be independent with initial HEP. Baseline:  Goal status: INITIAL  2.  SABRA  Patient will demonstrate > or = to 110 degrees in Rt shoulder P/ROM for improved reaching. Baseline:  Goal status: INITIAL   3.  Patient will report > or = to 30% use of Rt UE with light home and self care tasks due to improved ROM and strength. Baseline:  Goal status: INITIAL    LONG TERM GOALS: Target date: 12/28/2023  Patient will demonstrate independence in advanced HEP. Baseline:  Goal status: INITIAL  2.  Patient will report > or = to 70% of normal use of Rt UE  for ADLs due to improved functional strength and A/ROM. Baseline:  Goal status: INITIAL  3. .  Patient will demonstrate > or = to 115 degrees in Rt shoulder A/ROM for improved reaching overhead. Baseline:  Goal status: INITIAL   4.  Patient will demonstrate correct bending and lifting technique for safe return to work with no limitations.  Baseline:  Goal status: INITIAL  5.  Patient will score > or = to 46/100 on Quick DASH due to improved right shoulder function. Baseline:  Goal status: INITIAL   PLAN: PT FREQUENCY: 2x/week  PT DURATION: 8 weeks  PLANNED INTERVENTIONS: [id number]- PT Re-evaluation, 97110-Therapeutic exercises, 97530- Therapeutic activity, 97112- Neuromuscular re-education, 97535- Self Care, 02859- Manual therapy, [phone]- Canalith repositioning, [id number]- Aquatic Therapy, [phone]- Electrical stimulation (unattended), [phone]- Electrical stimulation (manual), [id number]- Vasopneumatic device, [id number]- Ultrasound, [id number]- Traction (mechanical), [id number]- Ionotophoresis 4mg /ml Dexamethasone, 79439 (1-2 muscles), 20561 (3+ muscles)- Dry Needling, Patient/Family education, Balance training, Stair training, Taping, Joint mobilization, Joint manipulation, Spinal manipulation, Spinal mobilization, Scar mobilization, Vestibular training, Cryotherapy, and Moist heat  PLAN FOR NEXT SESSION: Review HEP; follow protocol in media; right shoulder PROM (to 90 degrees); discontinue sling 10/31; start active assisted 10/37    Orvil Fester, PT 10/31/23 10:51 AM   [facility] [address] [place], [place] [address] Phone # [phone] Fax [phone]

## 2023-11-01 DIAGNOSIS — F411 Generalized anxiety disorder: Secondary | ICD-10-CM | POA: Diagnosis not present

## 2023-11-01 DIAGNOSIS — Z7689 Persons encountering health services in other specified circumstances: Secondary | ICD-10-CM | POA: Diagnosis not present

## 2023-11-01 DIAGNOSIS — F331 Major depressive disorder, recurrent, moderate: Secondary | ICD-10-CM | POA: Diagnosis not present

## 2023-11-01 DIAGNOSIS — M1712 Unilateral primary osteoarthritis, left knee: Secondary | ICD-10-CM | POA: Insufficient documentation

## 2023-11-01 DIAGNOSIS — M25562 Pain in left knee: Secondary | ICD-10-CM | POA: Insufficient documentation

## 2023-11-05 ENCOUNTER — Encounter: Payer: Self-pay | Admitting: Physical Therapy

## 2023-11-05 ENCOUNTER — Ambulatory Visit: Admitting: Physical Therapy

## 2023-11-05 DIAGNOSIS — M6281 Muscle weakness (generalized): Secondary | ICD-10-CM | POA: Diagnosis not present

## 2023-11-05 DIAGNOSIS — G8929 Other chronic pain: Secondary | ICD-10-CM | POA: Diagnosis not present

## 2023-11-05 DIAGNOSIS — M25611 Stiffness of right shoulder, not elsewhere classified: Secondary | ICD-10-CM | POA: Diagnosis not present

## 2023-11-05 DIAGNOSIS — M25511 Pain in right shoulder: Secondary | ICD-10-CM | POA: Diagnosis not present

## 2023-11-05 DIAGNOSIS — R293 Abnormal posture: Secondary | ICD-10-CM

## 2023-11-05 NOTE — Therapy (Signed)
 OUTPATIENT PHYSICAL THERAPY UPPER EXTREMITY TREATMENT   Patient Name: Alex Logan MRN: [id number] DOB:Jul 17, 1965, 58 y.o., male Today's Date: 11/05/2023  END OF SESSION:  PT End of Session - 11/05/23 0802     Visit Number 3    Authorization Type [place] [employer] (auth request) 27 VL    PT Start Time 0802    PT Stop Time 0840    PT Time Calculation (min) 38 min    Activity Tolerance Patient tolerated treatment well    Behavior During Therapy WFL for tasks assessed/performed            Past Medical History:  Diagnosis Date   Alcohol abuse    Anxiety    Depression    GERD (gastroesophageal reflux disease)    Hypertension    Past Surgical History:  Procedure Laterality Date   NO PAST SURGERIES     Patient Active Problem List   Diagnosis Date Noted   Hiccups 05/30/2022   Alcohol withdrawal delirium, acute, hypoactive (HCC) 05/30/2022   AKI (acute kidney injury) 02/17/2022   Thrombocytopenia 08/11/2020   Anxiety 06/26/2020   Tachycardia 01/14/2020   Elevated LFTs 05/27/2017   Muscular abdominal pain in right flank 05/27/2017   Fatty liver, alcoholic 05/27/2017   Depression with anxiety 05/27/2017   Pancytopenia (HCC) 05/27/2017   SVT (supraventricular tachycardia)    HTN (hypertension) 05/23/2017   Acute vomiting    Pressure injury of skin 11/12/2016   Alcohol withdrawal (HCC) 11/10/2016   SIRS (systemic inflammatory response syndrome) (HCC) 11/10/2016   High anion gap metabolic acidosis 11/10/2016   Lactic acidosis 11/10/2016   Hyponatremia 11/10/2016   Starvation ketoacidosis 11/10/2016   Alcohol use disorder, severe, dependence (HCC) 09/27/2016   Alcohol abuse with alcohol-induced mood disorder (HCC) 09/22/2016    PCP: None  REFERRING PROVIDER:    Brendan Gauze, PA-C    REFERRING DIAG: [phone] status post shoulder surgery   THERAPY DIAG:  Chronic right shoulder pain  Stiffness of right shoulder, not elsewhere  classified  Abnormal posture  Rationale for Evaluation and Treatment: Rehabilitation  ONSET DATE: Surgery Date Oct 3 , 2025  SUBJECTIVE:                                                                                                                                                                                      SUBJECTIVE STATEMENT: I've been wearing my sling.  Eval: Patient presents status post right shoulder coracoid transfer on Oct 12, 2023. He is supposed to be in the sling until Oct 31st, but he does not have it on today. He had a follow up appointment 10/26/2023 and everything is healing  well. He dislocated it shoulder 5 years ago from work accident, and the injury just kept getting worse.  Hand dominance: Right  PERTINENT HISTORY: HTN; Anxiety; Depression  PAIN:  Are you having pain? Yes: NPRS scale: only shoulder discomfort with elevation but it is mild Pain location: coracoid process  Pain description: discomfort Aggravating factors: reaching Relieving factors: Tylenol    PRECAUTIONS: Shoulder see protocol in media  RED FLAGS: None   WEIGHT BEARING RESTRICTIONS: No  FALLS:  Has patient fallen in last 6 months? No  LIVING ENVIRONMENT: Lives with: lives alone Lives in: House/apartment Stairs: Yes: Internal: 12 steps; on right going up   OCCUPATION: Not currently working- Works at [employer] (standing, bending, lifting )  PLOF: Independent, Independent with basic ADLs, Independent with household mobility without device, Independent with community mobility without device, Independent with gait, Independent with transfers, and Leisure: Walk  PATIENT GOALS: To get back to normal activities  NEXT MD VISIT: Week of Nov 10th   OBJECTIVE:  Note: Objective measures were completed at Evaluation unless otherwise noted.  DIAGNOSTIC FINDINGS:  04/12/2023 FINDINGS: Anterior-inferior dislocation of the right humeral head relative to the glenoid. No acute displaced  fracture. There is no evidence of arthropathy or other focal bone abnormality. Soft tissues are unremarkable. Old fracture of the right posterior fifth rib.   IMPRESSION: Anterior right shoulder dislocation.  PATIENT SURVEYS :  Quick Dash: 31.8/100 31.8%  Minimally Clinically Important Difference (MCID): 15-20 points   COGNITION: Overall cognitive status: Within functional limits for tasks assessed     SENSATION: WFL  POSTURE: Rounded shoulders & forward head  UPPER EXTREMITY ROM: PROM : shoulder flexion in scapular plane to 90 degrees (no pain or restriction), Mild discomfort with PROM to ER to neutral  UPPER EXTREMITY MMT:   MMT Right eval Left eval  Shoulder flexion    Shoulder extension    Shoulder abduction    Shoulder adduction    Shoulder internal rotation    Shoulder external rotation    Middle trapezius    Lower trapezius    Elbow flexion    Elbow extension    Wrist flexion    Wrist extension    Wrist ulnar deviation    Wrist radial deviation    Wrist pronation    Wrist supination    Grip strength (lbs) 90 82  (Blank rows = not tested)   PALPATION:  Increased muscle spasms right biceps Increased muscle guarding to right shoulder PROM                                                                                                                             TREATMENT DATE:  11/05/23 Standing scapular retraction 2x10 Rt elbow flexion A/ROM 2x10 Standing Rt shoulder abd isometric 15x5 Standing arm at side 5.0lb digiflex 3x20 reps Seated Rt wrist A/ROM flexion/ext, ulnar/radial dev, circles, sup/pron 2x10 each Supine P/ROM scaption to 90 deg, ER to neutral, elbow extension to end  range  Scar mobilization Rt anterior shoulder scar STM Rt bicep - slightly tight mid-belly and distally Reinforced reasoning behind need for compliance with protection phase to promote and protect tissues for healing in initial post-op stage   10/31/23 Pt arrives  without sling and admits not using it for about a week - demos what he thought was part of his HEP and raises Rt UE overhead  Standing scapular retraction 2x10 Rt elbow flexion A/ROM 2x10 Standing Rt shoulder abd isometric 15x5 Standing arm at side 5.0lb digiflex 3x20 reps Seated Rt wrist A/ROM flexion/ext, ulnar/radial dev, circles, sup/pron 2x10 each Supine P/ROM scaption to 90 deg, ER to neutral, elbow extension to end range  Scar mobilization Rt anterior shoulder scar STM Rt bicep - slightly tight mid-belly and distally Reinforced reasoning behind need for compliance with protection phase to promote and protect tissues for healing in initial post-op stage  10/29/2023 Initial Evaluation & HEP created Plan for therapy per protocols. Importance of wearing sling until 10/31.  If treatment provided at initial evaluation, no treatment charged due to lack of authorization.       PATIENT EDUCATION: Education details: PT eval findings, anticipated POC, progress with PT, and initial HEP Person educated: Patient Education method: Explanation, Demonstration, and Handouts Education comprehension: verbalized understanding, returned demonstration, and needs further education  HOME EXERCISE PROGRAM: Access Code: [email or web address] URL: [email or web address] Date: 10/29/2023 Prepared by: Kristeen Sar  Exercises - Seated Scapular Retraction  - 1 x daily - 7 x weekly - 1 sets - 10 reps - Seated Elbow Flexion and Extension AROM  - 1 x daily - 7 x weekly - 1 sets - 10 reps - Wrist Extension AROM  - 1 x daily - 7 x weekly - 1 sets - 10 reps - Isometric Shoulder Abduction at Wall  - 1 x daily - 7 x weekly - 1-2 sets - 5 reps - 5s hold  ASSESSMENT:  CLINICAL IMPRESSION: Patient reports compliance with sling since last visit. He reports no pain, just some discomfort occasionally. Good tolerance to all TE today. Should be able to progress protocol next visit with AA and additional isometrics.  Still has some biceps tightness which responded well to STM.   Eval: Patient is a 58 y.o. male who was seen today for physical therapy evaluation and treatment for right shoulder stiffness status post right shoulder coracoid transfer. Tykeem presents with a history of a right shoulder dislocation that happened at work 5 years ago. Over the years his injury got worse and it restricted his function. His surgery date was Oct 3rd and based on protocol he to wear the sling until Oct 31st. Patient did not have on the sling today and educated him on the importance of wearing until next week. Noted increased muscle guarding with right shoulder PROM, but motions were not painful and no increased restrictions were noted. Patient is not currently working, but his job duties required bending, lifting and stooping. Patient is highly motivated and wants to get back to his normal function. Patient will benefit from skilled PT to address the below impairments and improve overall function.    OBJECTIVE IMPAIRMENTS: decreased ROM, decreased strength, hypomobility, increased muscle spasms, impaired flexibility, impaired UE functional use, and pain.   ACTIVITY LIMITATIONS: carrying, lifting, bending, sleeping, dressing, reach over head, and hygiene/grooming  PARTICIPATION LIMITATIONS: meal prep, cleaning, laundry, community activity, and occupation  PERSONAL FACTORS: Fitness and 3+ comorbidities: HTN; Anxiety; Depression are also affecting patient's functional outcome.   REHAB  POTENTIAL: Good  CLINICAL DECISION MAKING: Evolving/moderate complexity  EVALUATION COMPLEXITY: Low  GOALS: Goals reviewed with patient? Yes  SHORT TERM GOALS: Target date: 11/26/2023  Patient will be independent with initial HEP. Baseline:  Goal status: INITIAL  2.  .  Patient will demonstrate > or = to 110 degrees in Rt shoulder P/ROM for improved reaching. Baseline:  Goal status: INITIAL   3.  Patient will report > or = to  30% use of Rt UE with light home and self care tasks due to improved ROM and strength. Baseline:  Goal status: INITIAL    LONG TERM GOALS: Target date: 12/28/2023  Patient will demonstrate independence in advanced HEP. Baseline:  Goal status: INITIAL  2.  Patient will report > or = to 70% of normal use of Rt UE for ADLs due to improved functional strength and A/ROM. Baseline:  Goal status: INITIAL  3. .  Patient will demonstrate > or = to 115 degrees in Rt shoulder A/ROM for improved reaching overhead. Baseline:  Goal status: INITIAL   4.  Patient will demonstrate correct bending and lifting technique for safe return to work with no limitations.  Baseline:  Goal status: INITIAL  5.  Patient will score > or = to 46/100 on Quick DASH due to improved right shoulder function. Baseline:  Goal status: INITIAL   PLAN: PT FREQUENCY: 2x/week  PT DURATION: 8 weeks  PLANNED INTERVENTIONS: 97164- PT Re-evaluation, 97110-Therapeutic exercises, 97530- Therapeutic activity, 97112- Neuromuscular re-education, 97535- Self Care, 02859- Manual therapy, [phone]- Canalith repositioning, [id number]- Aquatic Therapy, [phone]- Electrical stimulation (unattended), [phone]- Electrical stimulation (manual), [id number]- Vasopneumatic device, [id number]- Ultrasound, [id number]- Traction (mechanical), [id number]- Ionotophoresis 4mg /ml Dexamethasone, 20560 (1-2 muscles), 20561 (3+ muscles)- Dry Needling, Patient/Family education, Balance training, Stair training, Taping, Joint mobilization, Joint manipulation, Spinal manipulation, Spinal mobilization, Scar mobilization, Vestibular training, Cryotherapy, and Moist heat  PLAN FOR NEXT SESSION: Review HEP; Start AA follow protocol in media; right shoulder PROM (to 90 degrees); discontinue sling 10/31; start active assisted 10/37    Mliss Cummins, PT  11/05/23 8:47 AM  [facility] [address] [place], [place] [address] Phone # [phone] Fax  [phone]

## 2023-11-06 ENCOUNTER — Ambulatory Visit

## 2023-11-08 ENCOUNTER — Encounter: Payer: Self-pay | Admitting: Physical Therapy

## 2023-11-08 ENCOUNTER — Ambulatory Visit: Payer: Self-pay | Admitting: Physical Therapy

## 2023-11-08 DIAGNOSIS — R293 Abnormal posture: Secondary | ICD-10-CM

## 2023-11-08 DIAGNOSIS — M25611 Stiffness of right shoulder, not elsewhere classified: Secondary | ICD-10-CM | POA: Diagnosis not present

## 2023-11-08 DIAGNOSIS — G8929 Other chronic pain: Secondary | ICD-10-CM

## 2023-11-08 DIAGNOSIS — M6281 Muscle weakness (generalized): Secondary | ICD-10-CM

## 2023-11-08 DIAGNOSIS — M25511 Pain in right shoulder: Secondary | ICD-10-CM | POA: Diagnosis not present

## 2023-11-08 NOTE — Therapy (Signed)
 OUTPATIENT PHYSICAL THERAPY UPPER EXTREMITY TREATMENT   Patient Name: Alex Logan MRN: [id number] DOB:[birth year], 58 y.o., male Today's Date: 11/08/2023  END OF SESSION:  PT End of Session - 11/08/23 1122     Visit Number 4    Date for Recertification  12/28/23    Authorization Type [employer] Approved 16 vl 10/29/23-12/28/23-auth# [id number] (27 VL)    Authorization Time Period 10/29/23-12/28/23    Authorization - Visit Number 3    Authorization - Number of Visits 16    PT Start Time 1015    PT Stop Time 1105    PT Time Calculation (min) 50 min    Activity Tolerance Patient tolerated treatment well    Behavior During Therapy WFL for tasks assessed/performed             Past Medical History:  Diagnosis Date   Alcohol abuse    Anxiety    Depression    GERD (gastroesophageal reflux disease)    Hypertension    Past Surgical History:  Procedure Laterality Date   NO PAST SURGERIES     Patient Active Problem List   Diagnosis Date Noted   Hiccups 05/30/2022   Alcohol withdrawal delirium, acute, hypoactive (HCC) 05/30/2022   AKI (acute kidney injury) 02/17/2022   Thrombocytopenia 08/11/2020   Anxiety 06/26/2020   Tachycardia 01/14/2020   Elevated LFTs 05/27/2017   Muscular abdominal pain in right flank 05/27/2017   Fatty liver, alcoholic 05/27/2017   Depression with anxiety 05/27/2017   Pancytopenia (HCC) 05/27/2017   SVT (supraventricular tachycardia)    HTN (hypertension) 05/23/2017   Acute vomiting    Pressure injury of skin 11/12/2016   Alcohol withdrawal (HCC) 11/10/2016   SIRS (systemic inflammatory response syndrome) (HCC) 11/10/2016   High anion gap metabolic acidosis 11/10/2016   Lactic acidosis 11/10/2016   Hyponatremia 11/10/2016   Starvation ketoacidosis 11/10/2016   Alcohol use disorder, severe, dependence (HCC) 09/27/2016   Alcohol abuse with alcohol-induced mood disorder (HCC) 09/22/2016    PCP: None  REFERRING PROVIDER:     Brendan Gauze, PA-C    REFERRING DIAG: [phone] status post shoulder surgery   THERAPY DIAG:  Chronic right shoulder pain  Stiffness of right shoulder, not elsewhere classified  Abnormal posture  Muscle weakness (generalized)  Rationale for Evaluation and Treatment: Rehabilitation  ONSET DATE: Surgery Date Oct 3 , 2025  SUBJECTIVE:                                                                                                                                                                                      SUBJECTIVE STATEMENT: I forgot my sling, but I have been wearing it.  Eval: Patient presents status post right shoulder coracoid transfer on Oct 12, 2023. He is supposed to be in the sling until Oct 31st, but he does not have it on today. He had a follow up appointment 10/26/2023 and everything is healing well. He dislocated it shoulder 5 years ago from work accident, and the injury just kept getting worse.  Hand dominance: Right  PERTINENT HISTORY: HTN; Anxiety; Depression  PAIN:  Are you having pain? Yes: NPRS scale: only shoulder discomfort with elevation but it is mild Pain location: coracoid process  Pain description: discomfort Aggravating factors: reaching Relieving factors: Tylenol    PRECAUTIONS: Shoulder see protocol in media  RED FLAGS: None   WEIGHT BEARING RESTRICTIONS: No  FALLS:  Has patient fallen in last 6 months? No  LIVING ENVIRONMENT: Lives with: lives alone Lives in: House/apartment Stairs: Yes: Internal: 12 steps; on right going up   OCCUPATION: Not currently working- Works at [employer] (standing, bending, lifting )  PLOF: Independent, Independent with basic ADLs, Independent with household mobility without device, Independent with community mobility without device, Independent with gait, Independent with transfers, and Leisure: Walk  PATIENT GOALS: To get back to normal activities  NEXT MD VISIT: Week of Nov 10th   OBJECTIVE:   Note: Objective measures were completed at Evaluation unless otherwise noted.  DIAGNOSTIC FINDINGS:  04/12/2023 FINDINGS: Anterior-inferior dislocation of the right humeral head relative to the glenoid. No acute displaced fracture. There is no evidence of arthropathy or other focal bone abnormality. Soft tissues are unremarkable. Old fracture of the right posterior fifth rib.   IMPRESSION: Anterior right shoulder dislocation.  PATIENT SURVEYS :  Quick Dash: 31.8/100 31.8%  Minimally Clinically Important Difference (MCID): 15-20 points   COGNITION: Overall cognitive status: Within functional limits for tasks assessed     SENSATION: WFL  POSTURE: Rounded shoulders & forward head  UPPER EXTREMITY ROM: PROM : shoulder flexion in scapular plane to 90 degrees (no pain or restriction), Mild discomfort with PROM to ER to neutral  UPPER EXTREMITY MMT:   MMT Right eval Left eval  Shoulder flexion    Shoulder extension    Shoulder abduction    Shoulder adduction    Shoulder internal rotation    Shoulder external rotation    Middle trapezius    Lower trapezius    Elbow flexion    Elbow extension    Wrist flexion    Wrist extension    Wrist ulnar deviation    Wrist radial deviation    Wrist pronation    Wrist supination    Grip strength (lbs) 90 82  (Blank rows = not tested)   PALPATION:  Increased muscle spasms right biceps Increased muscle guarding to right shoulder PROM                                                                                                                             TREATMENT DATE:  11/08/23 Pulleys (flexion) x 2  mins Standing scapular retraction 2x10 Red stability ball roll up at wall (flexion & abduction) x 10 (abduction more challenging) ER/IR isometrics 15x5 Standing arm at side 3.0lb (5.0 was in use) digiflex 2x20 reps Supine P/ROM scaption to 90 deg, ER to neutral, elbow extension to end range  STM Rt bicep - slightly tight  mid-belly and distally    11/05/23 Standing scapular retraction 2x10 Rt elbow flexion A/ROM 2x10 Standing Rt shoulder abd isometric 15x5 Standing arm at side 5.0lb digiflex 3x20 reps Seated Rt wrist A/ROM flexion/ext, ulnar/radial dev, circles, sup/pron 2x10 each Supine P/ROM scaption to 90 deg, ER to neutral, elbow extension to end range  Scar mobilization Rt anterior shoulder scar STM Rt bicep - slightly tight mid-belly and distally Reinforced reasoning behind need for compliance with protection phase to promote and protect tissues for healing in initial post-op stage   10/31/23 Pt arrives without sling and admits not using it for about a week - demos what he thought was part of his HEP and raises Rt UE overhead  Standing scapular retraction 2x10 Rt elbow flexion A/ROM 2x10 Standing Rt shoulder abd isometric 15x5 Standing arm at side 5.0lb digiflex 3x20 reps Seated Rt wrist A/ROM flexion/ext, ulnar/radial dev, circles, sup/pron 2x10 each Supine P/ROM scaption to 90 deg, ER to neutral, elbow extension to end range  Scar mobilization Rt anterior shoulder scar STM Rt bicep - slightly tight mid-belly and distally Reinforced reasoning behind need for compliance with protection phase to promote and protect tissues for healing in initial post-op stage  10/29/2023 Initial Evaluation & HEP created Plan for therapy per protocols. Importance of wearing sling until 10/31.  If treatment provided at initial evaluation, no treatment charged due to lack of authorization.       PATIENT EDUCATION: Education details: PT eval findings, anticipated POC, progress with PT, and initial HEP Person educated: Patient Education method: Explanation, Demonstration, and Handouts Education comprehension: verbalized understanding, returned demonstration, and needs further education  HOME EXERCISE PROGRAM: Access Code: [email or web address] URL: [email or web address] Date: 10/29/2023 Prepared by:  Kristeen Sar  Exercises - Seated Scapular Retraction  - 1 x daily - 7 x weekly - 1 sets - 10 reps - Seated Elbow Flexion and Extension AROM  - 1 x daily - 7 x weekly - 1 sets - 10 reps - Wrist Extension AROM  - 1 x daily - 7 x weekly - 1 sets - 10 reps - Isometric Shoulder Abduction at Wall  - 1 x daily - 7 x weekly - 1-2 sets - 5 reps - 5s hold  ASSESSMENT:  CLINICAL IMPRESSION: Patient presents with no sling today, but he verbalized compliance since last visit. He is aware that 10/31 he discontinues wearing it. Today we were able to progress to active assisted ROM and ER/IR isometrics. Patient was apprehensive due to feeling like his shoulder was going to pop. STM was successful at targeting muscle spasms in biceps. Patient will benefit from skilled PT to address the below impairments and improve overall function.   Eval: Patient is a 58 y.o. male who was seen today for physical therapy evaluation and treatment for right shoulder stiffness status post right shoulder coracoid transfer. Dekota presents with a history of a right shoulder dislocation that happened at work 5 years ago. Over the years his injury got worse and it restricted his function. His surgery date was Oct 3rd and based on protocol he to wear the sling until Oct 31st. Patient did not have on the sling today  and educated him on the importance of wearing until next week. Noted increased muscle guarding with right shoulder PROM, but motions were not painful and no increased restrictions were noted. Patient is not currently working, but his job duties required bending, lifting and stooping. Patient is highly motivated and wants to get back to his normal function. Patient will benefit from skilled PT to address the below impairments and improve overall function.    OBJECTIVE IMPAIRMENTS: decreased ROM, decreased strength, hypomobility, increased muscle spasms, impaired flexibility, impaired UE functional use, and pain.   ACTIVITY  LIMITATIONS: carrying, lifting, bending, sleeping, dressing, reach over head, and hygiene/grooming  PARTICIPATION LIMITATIONS: meal prep, cleaning, laundry, community activity, and occupation  PERSONAL FACTORS: Fitness and 3+ comorbidities: HTN; Anxiety; Depression are also affecting patient's functional outcome.   REHAB POTENTIAL: Good  CLINICAL DECISION MAKING: Evolving/moderate complexity  EVALUATION COMPLEXITY: Low  GOALS: Goals reviewed with patient? Yes  SHORT TERM GOALS: Target date: 11/26/2023  Patient will be independent with initial HEP. Baseline:  Goal status: MET 11/08/2023  2.  .  Patient will demonstrate > or = to 110 degrees in Rt shoulder P/ROM for improved reaching. Baseline:  Goal status: INITIAL   3.  Patient will report > or = to 30% use of Rt UE with light home and self care tasks due to improved ROM and strength. Baseline:  Goal status: INITIAL    LONG TERM GOALS: Target date: 12/28/2023  Patient will demonstrate independence in advanced HEP. Baseline:  Goal status: INITIAL  2.  Patient will report > or = to 70% of normal use of Rt UE for ADLs due to improved functional strength and A/ROM. Baseline:  Goal status: INITIAL  3. .  Patient will demonstrate > or = to 115 degrees in Rt shoulder A/ROM for improved reaching overhead. Baseline:  Goal status: INITIAL   4.  Patient will demonstrate correct bending and lifting technique for safe return to work with no limitations.  Baseline:  Goal status: INITIAL  5.  Patient will score > or = to 46/100 on Quick DASH due to improved right shoulder function. Baseline:  Goal status: INITIAL   PLAN: PT FREQUENCY: 2x/week  PT DURATION: 8 weeks  PLANNED INTERVENTIONS: 97164- PT Re-evaluation, 97110-Therapeutic exercises, 97530- Therapeutic activity, 97112- Neuromuscular re-education, 97535- Self Care, 02859- Manual therapy, [phone]- Canalith repositioning, [id number]- Aquatic Therapy, [phone]- Electrical  stimulation (unattended), [phone]- Electrical stimulation (manual), [id number]- Vasopneumatic device, [id number]- Ultrasound, [id number]- Traction (mechanical), [id number]- Ionotophoresis 4mg /ml Dexamethasone, 79439 (1-2 muscles), 20561 (3+ muscles)- Dry Needling, Patient/Family education, Balance training, Stair training, Taping, Joint mobilization, Joint manipulation, Spinal manipulation, Spinal mobilization, Scar mobilization, Vestibular training, Cryotherapy, and Moist heat  PLAN FOR NEXT SESSION: continue AA shoulder flexion exercises; follow protocol in media; right shoulder PROM (to 90 degrees); discontinue sling 10/31   Kristeen Sar, PT, DPT 11/08/23 11:25 AM [facility] [address] [place], [place] [address] Phone # [phone] Fax [phone]

## 2023-11-12 DIAGNOSIS — F411 Generalized anxiety disorder: Secondary | ICD-10-CM | POA: Diagnosis not present

## 2023-11-12 DIAGNOSIS — F331 Major depressive disorder, recurrent, moderate: Secondary | ICD-10-CM | POA: Diagnosis not present

## 2023-11-13 DIAGNOSIS — H524 Presbyopia: Secondary | ICD-10-CM | POA: Diagnosis not present

## 2023-11-14 ENCOUNTER — Telehealth: Payer: Self-pay

## 2023-11-14 ENCOUNTER — Ambulatory Visit: Attending: Surgical

## 2023-11-14 DIAGNOSIS — R293 Abnormal posture: Secondary | ICD-10-CM | POA: Insufficient documentation

## 2023-11-14 DIAGNOSIS — M25611 Stiffness of right shoulder, not elsewhere classified: Secondary | ICD-10-CM | POA: Insufficient documentation

## 2023-11-14 DIAGNOSIS — M6281 Muscle weakness (generalized): Secondary | ICD-10-CM | POA: Insufficient documentation

## 2023-11-14 DIAGNOSIS — G8929 Other chronic pain: Secondary | ICD-10-CM | POA: Insufficient documentation

## 2023-11-14 DIAGNOSIS — M25511 Pain in right shoulder: Secondary | ICD-10-CM | POA: Insufficient documentation

## 2023-11-14 NOTE — Therapy (Signed)
 OUTPATIENT PHYSICAL THERAPY UPPER EXTREMITY TREATMENT   Patient Name: Alex Logan MRN: [id number] DOB:[birth year], 58 y.o., male Today's Date: 11/15/2023  END OF SESSION:  PT End of Session - 11/15/23 1015     Visit Number 5    Date for Recertification  12/28/23    Authorization Type [employer] Approved 16 vl 10/29/23-12/28/23-auth# [id number] (27 VL)    Authorization Time Period 10/29/23-12/28/23    Authorization - Visit Number 4    Authorization - Number of Visits 16    PT Start Time 1015    PT Stop Time 1100    PT Time Calculation (min) 45 min    Activity Tolerance Patient tolerated treatment well    Behavior During Therapy WFL for tasks assessed/performed              Past Medical History:  Diagnosis Date   Alcohol abuse    Anxiety    Depression    GERD (gastroesophageal reflux disease)    Hypertension    Past Surgical History:  Procedure Laterality Date   NO PAST SURGERIES     Patient Active Problem List   Diagnosis Date Noted   Hiccups 05/30/2022   Alcohol withdrawal delirium, acute, hypoactive (HCC) 05/30/2022   AKI (acute kidney injury) 02/17/2022   Thrombocytopenia 08/11/2020   Anxiety 06/26/2020   Tachycardia 01/14/2020   Elevated LFTs 05/27/2017   Muscular abdominal pain in right flank 05/27/2017   Fatty liver, alcoholic 05/27/2017   Depression with anxiety 05/27/2017   Pancytopenia (HCC) 05/27/2017   SVT (supraventricular tachycardia)    HTN (hypertension) 05/23/2017   Acute vomiting    Pressure injury of skin 11/12/2016   Alcohol withdrawal (HCC) 11/10/2016   SIRS (systemic inflammatory response syndrome) (HCC) 11/10/2016   High anion gap metabolic acidosis 11/10/2016   Lactic acidosis 11/10/2016   Hyponatremia 11/10/2016   Starvation ketoacidosis 11/10/2016   Alcohol use disorder, severe, dependence (HCC) 09/27/2016   Alcohol abuse with alcohol-induced mood disorder (HCC) 09/22/2016    PCP: None  REFERRING PROVIDER:     Brendan Gauze, PA-C    REFERRING DIAG: [phone] status post shoulder surgery   THERAPY DIAG:  Chronic right shoulder pain  Stiffness of right shoulder, not elsewhere classified  Abnormal posture  Muscle weakness (generalized)  Rationale for Evaluation and Treatment: Rehabilitation  ONSET DATE: Surgery Date Oct 3 , 2025  SUBJECTIVE:                                                                                                                                                                                      SUBJECTIVE STATEMENT: It's doing good.   Eval: Patient presents status post  right shoulder coracoid transfer on Oct 12, 2023. He is supposed to be in the sling until Oct 31st, but he does not have it on today. He had a follow up appointment 10/26/2023 and everything is healing well. He dislocated it shoulder 5 years ago from work accident, and the injury just kept getting worse.  Hand dominance: Right  PERTINENT HISTORY: HTN; Anxiety; Depression  PAIN:  Are you having pain? Yes: NPRS scale: only shoulder discomfort with elevation but it is mild Pain location: coracoid process  Pain description: discomfort Aggravating factors: reaching Relieving factors: Tylenol    PRECAUTIONS: Shoulder see protocol in media  RED FLAGS: None   WEIGHT BEARING RESTRICTIONS: No  FALLS:  Has patient fallen in last 6 months? No  LIVING ENVIRONMENT: Lives with: lives alone Lives in: House/apartment Stairs: Yes: Internal: 12 steps; on right going up   OCCUPATION: Not currently working- Works at [employer] (standing, bending, lifting )  PLOF: Independent, Independent with basic ADLs, Independent with household mobility without device, Independent with community mobility without device, Independent with gait, Independent with transfers, and Leisure: Walk  PATIENT GOALS: To get back to normal activities  NEXT MD VISIT: Week of Nov 10th   OBJECTIVE:  Note: Objective measures  were completed at Evaluation unless otherwise noted.  DIAGNOSTIC FINDINGS:  04/12/2023 FINDINGS: Anterior-inferior dislocation of the right humeral head relative to the glenoid. No acute displaced fracture. There is no evidence of arthropathy or other focal bone abnormality. Soft tissues are unremarkable. Old fracture of the right posterior fifth rib.   IMPRESSION: Anterior right shoulder dislocation.  PATIENT SURVEYS :  Quick Dash: 31.8/100 31.8%  Minimally Clinically Important Difference (MCID): 15-20 points   COGNITION: Overall cognitive status: Within functional limits for tasks assessed     SENSATION: WFL  POSTURE: Rounded shoulders & forward head  UPPER EXTREMITY ROM: PROM : shoulder flexion in scapular plane to 90 degrees (no pain or restriction), Mild discomfort with PROM to ER to neutral  UPPER EXTREMITY MMT:   MMT Right eval Left eval  Shoulder flexion    Shoulder extension    Shoulder abduction    Shoulder adduction    Shoulder internal rotation    Shoulder external rotation    Middle trapezius    Lower trapezius    Elbow flexion    Elbow extension    Wrist flexion    Wrist extension    Wrist ulnar deviation    Wrist radial deviation    Wrist pronation    Wrist supination    Grip strength (lbs) 90 82  (Blank rows = not tested)   PALPATION:  Increased muscle spasms right biceps Increased muscle guarding to right shoulder PROM                                                                                                                             TREATMENT DATE:  11/15/23 Pulleys (flexion) x 2 mins  Standing scapular retraction  2x10 Red stability ball roll up at wall (flexion & abduction) x 10 (abduction more challenging) Seated ER with dowel to 30 deg x 20 Supine AA flexion with dowel x 20 ER/IR isometrics 15x5 Standing arm at side 5.0lb digiflex 2x20 reps S/L A/A scapula SL mobility Supine P/ROM scaption to 120 + deg, ER to 30, elbow  extension to end range    11/08/23 Pulleys (flexion) x 2 mins Standing scapular retraction 2x10 Red stability ball roll up at wall (flexion & abduction) x 10 (abduction more challenging) ER/IR isometrics 15x5 Standing arm at side 3.0lb (5.0 was in use) digiflex 2x20 reps Supine P/ROM scaption to 90 deg, ER to neutral, elbow extension to end range  STM Rt bicep - slightly tight mid-belly and distally    11/05/23 Standing scapular retraction 2x10 Rt elbow flexion A/ROM 2x10 Standing Rt shoulder abd isometric 15x5 Standing arm at side 5.0lb digiflex 3x20 reps Seated Rt wrist A/ROM flexion/ext, ulnar/radial dev, circles, sup/pron 2x10 each Supine P/ROM scaption to 90 deg, ER to neutral, elbow extension to end range  Scar mobilization Rt anterior shoulder scar STM Rt bicep - slightly tight mid-belly and distally Reinforced reasoning behind need for compliance with protection phase to promote and protect tissues for healing in initial post-op stage   10/31/23 Pt arrives without sling and admits not using it for about a week - demos what he thought was part of his HEP and raises Rt UE overhead  Standing scapular retraction 2x10 Rt elbow flexion A/ROM 2x10 Standing Rt shoulder abd isometric 15x5 Standing arm at side 5.0lb digiflex 3x20 reps Seated Rt wrist A/ROM flexion/ext, ulnar/radial dev, circles, sup/pron 2x10 each Supine P/ROM scaption to 90 deg, ER to neutral, elbow extension to end range  Scar mobilization Rt anterior shoulder scar STM Rt bicep - slightly tight mid-belly and distally Reinforced reasoning behind need for compliance with protection phase to promote and protect tissues for healing in initial post-op stage  10/29/2023 Initial Evaluation & HEP created Plan for therapy per protocols. Importance of wearing sling until 10/31.  If treatment provided at initial evaluation, no treatment charged due to lack of authorization.       PATIENT EDUCATION: Education  details: PT eval findings, anticipated POC, progress with PT, and initial HEP Person educated: Patient Education method: Explanation, Demonstration, and Handouts Education comprehension: verbalized understanding, returned demonstration, and needs further education  HOME EXERCISE PROGRAM: Access Code: [email or web address] URL: [email or web address] Date: 11/15/2023 Prepared by: Mliss  Exercises - Seated Scapular Retraction  - 1 x daily - 7 x weekly - 1 sets - 10 reps - Seated Elbow Flexion and Extension AROM  - 1 x daily - 7 x weekly - 1 sets - 10 reps - Wrist Extension AROM  - 1 x daily - 7 x weekly - 1 sets - 10 reps - Isometric Shoulder Abduction at Wall  - 1 x daily - 7 x weekly - 1-2 sets - 5 reps - 5s hold - Seated Shoulder External Rotation AAROM with Cane and Hand in Neutral  - 2 x daily - 7 x weekly - 2 sets - 10 reps - Supine Shoulder Flexion Extension AAROM with Dowel  - 2 x daily - 7 x weekly - 2 sets - 10 reps  ASSESSMENT:  CLINICAL IMPRESSION: Patient is 4 weeks post/op. He did well with wand exercises. He reports mild stretch with ER at 30 deg. He is also somewhat apprehensive with AA flexion in supine due to h/o dislocation, but this  eased with more reps. He does not show any hesitation with OH flexion in sitting or standing. Reviewed isometrics for correct form and pressure. Patient is progressing appropriately.    Eval: Patient is a 58 y.o. male who was seen today for physical therapy evaluation and treatment for right shoulder stiffness status post right shoulder coracoid transfer. Alex Logan presents with a history of a right shoulder dislocation that happened at work 5 years ago. Over the years his injury got worse and it restricted his function. His surgery date was Oct 3rd and based on protocol he to wear the sling until Oct 31st. Patient did not have on the sling today and educated him on the importance of wearing until next week. Noted increased muscle guarding with  right shoulder PROM, but motions were not painful and no increased restrictions were noted. Patient is not currently working, but his job duties required bending, lifting and stooping. Patient is highly motivated and wants to get back to his normal function. Patient will benefit from skilled PT to address the below impairments and improve overall function.    OBJECTIVE IMPAIRMENTS: decreased ROM, decreased strength, hypomobility, increased muscle spasms, impaired flexibility, impaired UE functional use, and pain.   ACTIVITY LIMITATIONS: carrying, lifting, bending, sleeping, dressing, reach over head, and hygiene/grooming  PARTICIPATION LIMITATIONS: meal prep, cleaning, laundry, community activity, and occupation  PERSONAL FACTORS: Fitness and 3+ comorbidities: HTN; Anxiety; Depression are also affecting patient's functional outcome.   REHAB POTENTIAL: Good  CLINICAL DECISION MAKING: Evolving/moderate complexity  EVALUATION COMPLEXITY: Low  GOALS: Goals reviewed with patient? Yes  SHORT TERM GOALS: Target date: 11/26/2023  Patient will be independent with initial HEP. Baseline:  Goal status: MET 11/08/2023  2.  .  Patient will demonstrate > or = to 110 degrees in Rt shoulder P/ROM for improved reaching. Baseline:  Goal status: INITIAL   3.  Patient will report > or = to 30% use of Rt UE with light home and self care tasks due to improved ROM and strength. Baseline:  Goal status: INITIAL    LONG TERM GOALS: Target date: 12/28/2023  Patient will demonstrate independence in advanced HEP. Baseline:  Goal status: INITIAL  2.  Patient will report > or = to 70% of normal use of Rt UE for ADLs due to improved functional strength and A/ROM. Baseline:  Goal status: INITIAL  3. .  Patient will demonstrate > or = to 115 degrees in Rt shoulder A/ROM for improved reaching overhead. Baseline:  Goal status: INITIAL   4.  Patient will demonstrate correct bending and lifting  technique for safe return to work with no limitations.  Baseline:  Goal status: INITIAL  5.  Patient will score > or = to 46/100 on Quick DASH due to improved right shoulder function. Baseline:  Goal status: INITIAL   PLAN: PT FREQUENCY: 2x/week  PT DURATION: 8 weeks  PLANNED INTERVENTIONS: 97164- PT Re-evaluation, 97110-Therapeutic exercises, 97530- Therapeutic activity, 97112- Neuromuscular re-education, 97535- Self Care, 02859- Manual therapy, [id number]- Canalith repositioning, [id number]- Aquatic Therapy, [phone]- Electrical stimulation (unattended), [phone]- Electrical stimulation (manual), [id number]- Vasopneumatic device, [id number]- Ultrasound, [id number]- Traction (mechanical), [id number]- Ionotophoresis 4mg /ml Dexamethasone, 79439 (1-2 muscles), 20561 (3+ muscles)- Dry Needling, Patient/Family education, Balance training, Stair training, Taping, Joint mobilization, Joint manipulation, Spinal manipulation, Spinal mobilization, Scar mobilization, Vestibular training, Cryotherapy, and Moist heat  PLAN FOR NEXT SESSION: continue AA shoulder flexion exercises; follow protocol in media; right shoulder PROM (to 90 degrees); discontinue sling 10/31   Kristeen Sar, PT, DPT 11/15/23  12:00 PM [facility] [address] [place], [place] [address] Phone # [phone] Fax [phone]

## 2023-11-14 NOTE — Telephone Encounter (Signed)
 No show for appt today at 3:30.  Pt forgot his appt and rescheduled for 11/14/23 at 10:15.

## 2023-11-15 ENCOUNTER — Encounter: Payer: Self-pay | Admitting: Gastroenterology

## 2023-11-15 ENCOUNTER — Ambulatory Visit: Admitting: Gastroenterology

## 2023-11-15 ENCOUNTER — Encounter: Payer: Self-pay | Admitting: Physical Therapy

## 2023-11-15 ENCOUNTER — Ambulatory Visit: Admitting: Physical Therapy

## 2023-11-15 VITALS — BP 110/78 | HR 68 | Temp 98.1°F | Resp 15 | Ht 70.0 in | Wt 173.6 lb

## 2023-11-15 DIAGNOSIS — M25511 Pain in right shoulder: Secondary | ICD-10-CM | POA: Diagnosis not present

## 2023-11-15 DIAGNOSIS — R293 Abnormal posture: Secondary | ICD-10-CM | POA: Diagnosis not present

## 2023-11-15 DIAGNOSIS — I10 Essential (primary) hypertension: Secondary | ICD-10-CM | POA: Diagnosis not present

## 2023-11-15 DIAGNOSIS — G8929 Other chronic pain: Secondary | ICD-10-CM

## 2023-11-15 DIAGNOSIS — M6281 Muscle weakness (generalized): Secondary | ICD-10-CM

## 2023-11-15 DIAGNOSIS — K219 Gastro-esophageal reflux disease without esophagitis: Secondary | ICD-10-CM | POA: Diagnosis not present

## 2023-11-15 DIAGNOSIS — M25611 Stiffness of right shoulder, not elsewhere classified: Secondary | ICD-10-CM

## 2023-11-15 DIAGNOSIS — F419 Anxiety disorder, unspecified: Secondary | ICD-10-CM | POA: Diagnosis not present

## 2023-11-15 DIAGNOSIS — F32A Depression, unspecified: Secondary | ICD-10-CM | POA: Diagnosis not present

## 2023-11-15 DIAGNOSIS — R112 Nausea with vomiting, unspecified: Secondary | ICD-10-CM

## 2023-11-15 DIAGNOSIS — K449 Diaphragmatic hernia without obstruction or gangrene: Secondary | ICD-10-CM | POA: Diagnosis not present

## 2023-11-15 MED ORDER — SODIUM CHLORIDE 0.9 % IV SOLN: 500.0000 mL | Freq: Once | INTRAVENOUS | Status: DC

## 2023-11-15 NOTE — Op Note (Signed)
 [facility] Patient Name: Alex Logan Procedure Date: 11/15/2023 12:18 PM MRN: [id number] Endoscopist: Sandor Flatter , MD, [id number] Age: 58 Referring MD:  Date of Birth: [birth year] Gender: Male Account #: [id number] Procedure:                Upper GI endoscopy Indications:              Heartburn, Esophageal reflux, Screening for                            Barrett's esophagus, Nausea with vomiting Medicines:                Monitored Anesthesia Care Procedure:                Pre-Anesthesia Assessment:                           - Prior to the procedure, a History and Physical                            was performed, and patient medications and                            allergies were reviewed. The patient's tolerance of                            previous anesthesia was also reviewed. The risks                            and benefits of the procedure and the sedation                            options and risks were discussed with the patient.                            All questions were answered, and informed consent                            was obtained. Prior Anticoagulants: The patient has                            taken no anticoagulant or antiplatelet agents. ASA                            Grade Assessment: II - A patient with mild systemic                            disease. After reviewing the risks and benefits,                            the patient was deemed in satisfactory condition to                            undergo the procedure.  After obtaining informed consent, the endoscope was                            passed under direct vision. Throughout the                            procedure, the patient's blood pressure, pulse, and                            oxygen saturations were monitored continuously. The                            Olympus Scope [id number] was introduced through the                            mouth, and  advanced to the second part of duodenum.                            The upper GI endoscopy was accomplished without                            difficulty. The patient tolerated the procedure                            well. Scope In: Scope Out: Findings:                 The Z-line was mildly irregular and was found 35 cm                            from the incisors. This was visualized with both                            white light and narrow band imaging. Biopsies were                            taken with a cold forceps for histology. Estimated                            blood loss was minimal.                           A 5 cm hiatal hernia was present.                           The upper third of the esophagus and middle third                            of the esophagus were normal.                           The entire examined stomach was normal. Biopsies  were taken with a cold forceps for Helicobacter                            pylori testing. Estimated blood loss was minimal.                           The examined duodenum was normal. Complications:            No immediate complications. Estimated Blood Loss:     Estimated blood loss was minimal. Impression:               - Z-line irregular, 35 cm from the incisors.                            Biopsied.                           - 5 cm hiatal hernia.                           - Normal upper third of esophagus and middle third                            of esophagus.                           - Normal stomach. Biopsied.                           - Normal examined duodenum. Recommendation:           - Patient has a contact number available for                            emergencies. The signs and symptoms of potential                            delayed complications were discussed with the                            patient. Return to normal activities tomorrow.                            Written  discharge instructions were provided to the                            patient.                           - Resume previous diet.                           - Continue present medications.                           - Await pathology results.                           -  If continued reflux symptoms despite high dose                            PPI, consider either medication change or referral                            to the surgical clinic for possible hiatal hernia                            repair and anti-reflux surgery. Sandor Flatter, MD 11/15/2023 1:25:34 PM

## 2023-11-15 NOTE — Progress Notes (Signed)
 Patient states had right shoulder surgery last month. Otherwise, no other changes in medical history since office visit.

## 2023-11-15 NOTE — Patient Instructions (Addendum)
 Handouts given: Hiatal Hernia Resume previous diet. Continue present medications.  Await pathology results. If continued reflux symptoms despite high dose PPI, consider either medication change or referral to the surgical clinic for possible hiatal hernia repair and anti-reflux surgery.  YOU HAD AN ENDOSCOPIC PROCEDURE TODAY AT THE [facility]:   Refer to the procedure report that was given to you for any specific questions about what was found during the examination.  If the procedure report does not answer your questions, please call your gastroenterologist to clarify.  If you requested that your care partner not be given the details of your procedure findings, then the procedure report has been included in a sealed envelope for you to review at your convenience later.  YOU SHOULD EXPECT: Some feelings of bloating in the abdomen. Passage of more gas than usual.  Walking can help get rid of the air that was put into your GI tract during the procedure and reduce the bloating. If you had a lower endoscopy (such as a colonoscopy or flexible sigmoidoscopy) you may notice spotting of blood in your stool or on the toilet paper. If you underwent a bowel prep for your procedure, you may not have a normal bowel movement for a few days.  Please Note:  You might notice some irritation and congestion in your nose or some drainage.  This is from the oxygen used during your procedure.  There is no need for concern and it should clear up in a day or so.  SYMPTOMS TO REPORT IMMEDIATELY:  Following upper endoscopy (EGD)  Vomiting of blood or coffee ground material  New chest pain or pain under the shoulder blades  Painful or persistently difficult swallowing  New shortness of breath  Fever of 100F or higher  Black, tarry-looking stools  For urgent or emergent issues, a gastroenterologist can be reached at any hour by calling [phone]. Do not use MyChart messaging for urgent concerns.     DIET:  We do recommend a small meal at first, but then you may proceed to your regular diet.  Drink plenty of fluids but you should avoid alcoholic beverages for 24 hours.  ACTIVITY:  You should plan to take it easy for the rest of today and you should NOT DRIVE or use heavy machinery until tomorrow (because of the sedation medicines used during the test).    FOLLOW UP: Our staff will call the number listed on your records the next business day following your procedure.  We will call around 7:15- 8:00 am to check on you and address any questions or concerns that you may have regarding the information given to you following your procedure. If we do not reach you, we will leave a message.     If any biopsies were taken you will be contacted by phone or by letter within the next 1-3 weeks.  Please call us  at [phone] if you have not heard about the biopsies in 3 weeks.    SIGNATURES/CONFIDENTIALITY: You and/or your care partner have signed paperwork which will be entered into your electronic medical record.  These signatures attest to the fact that that the information above on your After Visit Summary has been reviewed and is understood.  Full responsibility of the confidentiality of this discharge information lies with you and/or your care-partner.

## 2023-11-15 NOTE — Progress Notes (Signed)
 GASTROENTEROLOGY PROCEDURE H&P NOTE   Primary Care Physician: [facility]    Reason for Procedure:  GERD, nausea/vomiting  Plan:    EGD  Patient is appropriate for endoscopic procedure(s) in the ambulatory ([facility]) setting.  The nature of the procedure, as well as the risks, benefits, and alternatives were carefully and thoroughly reviewed with the patient. Ample time for discussion and questions allowed. The patient understood, was satisfied, and agreed to proceed. I personally addressed all patient questions and concerns.     HPI: Alex Logan is a 58 y.o. male who presents for EGD for evaluation of uncontrolled reflux symptoms along with nausea/vomiting.  Longstanding history of reflux and currently taking Nexium  40 mg twice daily but having episodic breakthrough symptoms.  Otherwise no significant change in clinical history since last OV on 10/09/2023  Past Medical History:  Diagnosis Date   Alcohol abuse    Anxiety    Depression    GERD (gastroesophageal reflux disease)    Hypertension     Past Surgical History:  Procedure Laterality Date   NO PAST SURGERIES      Prior to Admission medications   Medication Sig Start Date End Date Taking? Authorizing Provider  escitalopram  (LEXAPRO ) 20 MG tablet Take 10 mg by mouth daily. 11/08/20   [provider]  esomeprazole  (NEXIUM ) 40 MG capsule TAKE 1 CAPSULE BY MOUTH 2 TIMES A DAY 09/21/23   Federico Rosario BROCKS, MD  lisinopril  (ZESTRIL ) 20 MG tablet Take 20 mg by mouth daily. 01/25/22   [provider]  propranolol  ER (INDERAL  LA) 80 MG 24 hr capsule Take 1 capsule (80 mg total) by mouth daily. 09/02/22   Leotis Bogus, MD    Current Outpatient Medications  Medication Sig Dispense Refill   escitalopram  (LEXAPRO ) 20 MG tablet Take 10 mg by mouth daily.     esomeprazole  (NEXIUM ) 40 MG capsule TAKE 1 CAPSULE BY MOUTH 2 TIMES A DAY 60 capsule 2   lisinopril  (ZESTRIL ) 20 MG tablet Take 20 mg by mouth  daily.     propranolol  ER (INDERAL  LA) 80 MG 24 hr capsule Take 1 capsule (80 mg total) by mouth daily. 30 capsule 1   No current facility-administered medications for this visit.    Allergies as of 11/15/2023   (No Known Allergies)    Family History  Problem Relation Age of Onset   Emphysema Mother    COPD Mother    Bipolar disorder Father    Alcohol abuse Father    Hypertension Father    Alcoholism Paternal Grandmother    Alcoholism Paternal Grandfather    Mental illness Neg Hx    Colon cancer Neg Hx    Esophageal cancer Neg Hx    Pancreatic cancer Neg Hx    Stomach cancer Neg Hx     Social History   Socioeconomic History   Marital status: Divorced    Spouse name: Not on file   Number of children: 2   Years of education: Not on file   Highest education level: Not on file  Occupational History   Not on file  Tobacco Use   Smoking status: Never   Smokeless tobacco: Never  Vaping Use   Vaping status: Never Used  Substance and Sexual Activity   Alcohol use: Yes    Comment: Chronic ETOH use and abuse; no currently using 10-09-23   Drug use: No   Sexual activity: Not Currently  Other Topics Concern   Not on file  Social History Narrative   Lives alone. Currently unemployed.    Social Drivers of Corporate Investment Banker Strain: Not on file  Food Insecurity: No Food Insecurity (09/01/2022)   Hunger Vital Sign    Worried About Running Out of Food in the Last Year: Never true    Ran Out of Food in the Last Year: Never true  Transportation Needs: No Transportation Needs (09/01/2022)   PRAPARE - Administrator, Civil Service (Medical): No    Lack of Transportation (Non-Medical): No  Physical Activity: Not on file  Stress: Not on file  Social Connections: Unknown (05/20/2021)   Received from [facility]   Social Network    Social Network: Not on file  Intimate Partner Violence: Not At Risk (09/01/2022)   Humiliation, Afraid, Rape, and Kick  questionnaire    Fear of Current or Ex-Partner: No    Emotionally Abused: No    Physically Abused: No    Sexually Abused: No    Physical Exam: Vital signs in last 24 hours: @There  were no vitals taken for this visit. GEN: NAD EYE: Sclerae anicteric ENT: MMM CV: Non-tachycardic Pulm: CTA b/l GI: Soft, NT/ND NEURO:  Alert & Oriented x 3   Sandor Flatter, DO [facility]   11/15/2023 12:19 PM

## 2023-11-15 NOTE — Progress Notes (Signed)
 Transferred to PACU via stretcher.  Not responding to stimulation at this time.  VSS upon leaving procedure room.

## 2023-11-15 NOTE — Progress Notes (Signed)
 Called to room to assist during endoscopic procedure.  Patient ID and intended procedure confirmed with present staff. Received instructions for my participation in the procedure from the performing physician.

## 2023-11-16 ENCOUNTER — Telehealth: Payer: Self-pay

## 2023-11-16 ENCOUNTER — Other Ambulatory Visit: Payer: Self-pay

## 2023-11-16 ENCOUNTER — Ambulatory Visit: Admitting: Physical Therapy

## 2023-11-16 ENCOUNTER — Other Ambulatory Visit (HOSPITAL_COMMUNITY): Payer: Self-pay

## 2023-11-16 ENCOUNTER — Encounter: Payer: Self-pay | Admitting: Gastroenterology

## 2023-11-16 DIAGNOSIS — G8929 Other chronic pain: Secondary | ICD-10-CM

## 2023-11-16 DIAGNOSIS — M6281 Muscle weakness (generalized): Secondary | ICD-10-CM | POA: Diagnosis not present

## 2023-11-16 DIAGNOSIS — Z7689 Persons encountering health services in other specified circumstances: Secondary | ICD-10-CM | POA: Diagnosis not present

## 2023-11-16 DIAGNOSIS — R293 Abnormal posture: Secondary | ICD-10-CM

## 2023-11-16 DIAGNOSIS — M25611 Stiffness of right shoulder, not elsewhere classified: Secondary | ICD-10-CM | POA: Diagnosis not present

## 2023-11-16 DIAGNOSIS — M25511 Pain in right shoulder: Secondary | ICD-10-CM | POA: Diagnosis not present

## 2023-11-16 MED ORDER — DEXLANSOPRAZOLE 60 MG PO CPDR
60.0000 mg | DELAYED_RELEASE_CAPSULE | Freq: Every day | ORAL | 5 refills | Status: AC
Start: 2023-11-16 — End: ?

## 2023-11-16 NOTE — Telephone Encounter (Signed)
  Follow up Call-     11/15/2023   12:32 PM  Call back number  Post procedure Call Back phone  # [phone]  Permission to leave phone message Yes     Patient questions:  Do you have a fever, pain , or abdominal swelling? No. Pain Score  0 *  Have you tolerated food without any problems? Yes.    Have you been able to return to your normal activities? Yes.    Do you have any questions about your discharge instructions: Diet   No. Medications  Yes.   Patient would like to try a different reflux medications as suggested by Dr. Almeta. Will send Mychart message Follow up visit  No.  Do you have questions or concerns about your Care? No.  Actions: * If pain score is 4 or above: No action needed, pain <4.

## 2023-11-16 NOTE — Telephone Encounter (Signed)
 Pharmacy Patient Advocate Encounter   Received notification from CoverMyMeds that prior authorization for Dexlansoprazole 60MG  dr capsules is required/requested.   Insurance verification completed.   The patient is insured through [facility] MEDICAID.   Per test claim: PA required; PA submitted to above mentioned insurance via Latent Key/confirmation #/EOC [id number] Status is pending

## 2023-11-16 NOTE — Therapy (Signed)
 OUTPATIENT PHYSICAL THERAPY UPPER EXTREMITY TREATMENT   Patient Name: Alex Logan MRN: [id number] DOB:1965/12/28, 58 y.o., male Today's Date: 11/16/2023  END OF SESSION:  PT End of Session - 11/16/23 1015     Visit Number 6    Date for Recertification  12/28/23    Authorization Type RADMD Approved 16 vl 10/29/23-12/28/23-auth# [id number] (27 VL)    Authorization Time Period 10/29/23-12/28/23    Authorization - Visit Number 5    Authorization - Number of Visits 16    PT Start Time 0932    PT Stop Time 1014    PT Time Calculation (min) 42 min    Activity Tolerance Patient tolerated treatment well    Behavior During Therapy WFL for tasks assessed/performed               Past Medical History:  Diagnosis Date   Alcohol abuse    Anxiety    Depression    GERD (gastroesophageal reflux disease)    Hypertension    Past Surgical History:  Procedure Laterality Date   NO PAST SURGERIES     SHOULDER SURGERY Right 10/15/2023   Patient Active Problem List   Diagnosis Date Noted   Hiccups 05/30/2022   Alcohol withdrawal delirium, acute, hypoactive (HCC) 05/30/2022   AKI (acute kidney injury) 02/17/2022   Thrombocytopenia 08/11/2020   Anxiety 06/26/2020   Tachycardia 01/14/2020   Elevated LFTs 05/27/2017   Muscular abdominal pain in right flank 05/27/2017   Fatty liver, alcoholic 05/27/2017   Depression with anxiety 05/27/2017   Pancytopenia (HCC) 05/27/2017   SVT (supraventricular tachycardia)    HTN (hypertension) 05/23/2017   Acute vomiting    Pressure injury of skin 11/12/2016   Alcohol withdrawal (HCC) 11/10/2016   SIRS (systemic inflammatory response syndrome) (HCC) 11/10/2016   High anion gap metabolic acidosis 11/10/2016   Lactic acidosis 11/10/2016   Hyponatremia 11/10/2016   Starvation ketoacidosis 11/10/2016   Alcohol use disorder, severe, dependence (HCC) 09/27/2016   Alcohol abuse with alcohol-induced mood disorder (HCC) 09/22/2016    PCP:  None  REFERRING PROVIDER:    Brendan Gauze, PA-C    REFERRING DIAG: [phone] status post shoulder surgery   THERAPY DIAG:  Chronic right shoulder pain  Stiffness of right shoulder, not elsewhere classified  Abnormal posture  Muscle weakness (generalized)  Rationale for Evaluation and Treatment: Rehabilitation  ONSET DATE: Surgery Date Oct 3 , 2025  SUBJECTIVE:                                                                                                                                                                                      SUBJECTIVE STATEMENT: Patient reports he  is doing good . His shoulder feels a little tight.   Eval: Patient presents status post right shoulder coracoid transfer on Oct 12, 2023. He is supposed to be in the sling until Oct 31st, but he does not have it on today. He had a follow up appointment 10/26/2023 and everything is healing well. He dislocated it shoulder 5 years ago from work accident, and the injury just kept getting worse.  Hand dominance: Right  PERTINENT HISTORY: HTN; Anxiety; Depression  PAIN:  Are you having pain? Yes: NPRS scale: only shoulder discomfort with elevation but it is mild Pain location: coracoid process  Pain description: discomfort Aggravating factors: reaching Relieving factors: Tylenol    PRECAUTIONS: Shoulder see protocol in media  RED FLAGS: None   WEIGHT BEARING RESTRICTIONS: No  FALLS:  Has patient fallen in last 6 months? No  LIVING ENVIRONMENT: Lives with: lives alone Lives in: House/apartment Stairs: Yes: Internal: 12 steps; on right going up   OCCUPATION: Not currently working- Works at [employer] (standing, bending, lifting )  PLOF: Independent, Independent with basic ADLs, Independent with household mobility without device, Independent with community mobility without device, Independent with gait, Independent with transfers, and Leisure: Walk  PATIENT GOALS: To get back to normal  activities  NEXT MD VISIT: Week of Nov 10th   OBJECTIVE:  Note: Objective measures were completed at Evaluation unless otherwise noted.  DIAGNOSTIC FINDINGS:  04/12/2023 FINDINGS: Anterior-inferior dislocation of the right humeral head relative to the glenoid. No acute displaced fracture. There is no evidence of arthropathy or other focal bone abnormality. Soft tissues are unremarkable. Old fracture of the right posterior fifth rib.   IMPRESSION: Anterior right shoulder dislocation.  PATIENT SURVEYS :  Quick Dash: 31.8/100 31.8%  Minimally Clinically Important Difference (MCID): 15-20 points   COGNITION: Overall cognitive status: Within functional limits for tasks assessed     SENSATION: WFL  POSTURE: Rounded shoulders & forward head  UPPER EXTREMITY ROM: PROM : shoulder flexion in scapular plane to 90 degrees (no pain or restriction), Mild discomfort with PROM to ER to neutral  UPPER EXTREMITY MMT:   MMT Right eval Left eval  Shoulder flexion    Shoulder extension    Shoulder abduction    Shoulder adduction    Shoulder internal rotation    Shoulder external rotation    Middle trapezius    Lower trapezius    Elbow flexion    Elbow extension    Wrist flexion    Wrist extension    Wrist ulnar deviation    Wrist radial deviation    Wrist pronation    Wrist supination    Grip strength (lbs) 90 82  (Blank rows = not tested)   PALPATION:  Increased muscle spasms right biceps Increased muscle guarding to right shoulder PROM  TREATMENT DATE:  11/16/23 Pulleys (flexion) x 2 mins  Finger ladder (flexion ) x 5 each ER/IR isometrics 10x5 Standing scapular retraction 2x10 with foam roll along spine Supine P/ROM scaption to 120 + deg, ER to 30, elbow extension to end range ; SL scapula mobs (upward rotation & protraction) Supine AA flexion  with dowel x 20 Seated ER with dowel to 30 deg x 20     11/15/23 Pulleys (flexion) x 2 mins  Standing scapular retraction 2x10 Red stability ball roll up at wall (flexion & abduction) x 10 (abduction more challenging) Seated ER with dowel to 30 deg x 20 Supine AA flexion with dowel x 20 ER/IR isometrics 15x5 Standing arm at side 5.0lb digiflex 2x20 reps S/L A/A scapula SL mobility Supine P/ROM scaption to 120 + deg, ER to 30, elbow extension to end range    11/08/23 Pulleys (flexion) x 2 mins Standing scapular retraction 2x10 Red stability ball roll up at wall (flexion & abduction) x 10 (abduction more challenging) ER/IR isometrics 15x5 Standing arm at side 3.0lb (5.0 was in use) digiflex 2x20 reps Supine P/ROM scaption to 90 deg, ER to neutral, elbow extension to end range  STM Rt bicep - slightly tight mid-belly and distally    11/05/23 Standing scapular retraction 2x10 Rt elbow flexion A/ROM 2x10 Standing Rt shoulder abd isometric 15x5 Standing arm at side 5.0lb digiflex 3x20 reps Seated Rt wrist A/ROM flexion/ext, ulnar/radial dev, circles, sup/pron 2x10 each Supine P/ROM scaption to 90 deg, ER to neutral, elbow extension to end range  Scar mobilization Rt anterior shoulder scar STM Rt bicep - slightly tight mid-belly and distally Reinforced reasoning behind need for compliance with protection phase to promote and protect tissues for healing in initial post-op stage   10/31/23 Pt arrives without sling and admits not using it for about a week - demos what he thought was part of his HEP and raises Rt UE overhead  Standing scapular retraction 2x10 Rt elbow flexion A/ROM 2x10 Standing Rt shoulder abd isometric 15x5 Standing arm at side 5.0lb digiflex 3x20 reps Seated Rt wrist A/ROM flexion/ext, ulnar/radial dev, circles, sup/pron 2x10 each Supine P/ROM scaption to 90 deg, ER to neutral, elbow extension to end range  Scar mobilization Rt anterior shoulder scar STM  Rt bicep - slightly tight mid-belly and distally Reinforced reasoning behind need for compliance with protection phase to promote and protect tissues for healing in initial post-op stage   PATIENT EDUCATION: Education details: PT eval findings, anticipated POC, progress with PT, and initial HEP Person educated: Patient Education method: Explanation, Demonstration, and Handouts Education comprehension: verbalized understanding, returned demonstration, and needs further education  HOME EXERCISE PROGRAM: Access Code: [email or web address] URL: [email or web address] Date: 11/15/2023 Prepared by: Mliss  Exercises - Seated Scapular Retraction  - 1 x daily - 7 x weekly - 1 sets - 10 reps - Seated Elbow Flexion and Extension AROM  - 1 x daily - 7 x weekly - 1 sets - 10 reps - Wrist Extension AROM  - 1 x daily - 7 x weekly - 1 sets - 10 reps - Isometric Shoulder Abduction at Wall  - 1 x daily - 7 x weekly - 1-2 sets - 5 reps - 5s hold - Seated Shoulder External Rotation AAROM with Cane and Hand in Neutral  - 2 x daily - 7 x weekly - 2 sets - 10 reps - Supine Shoulder Flexion Extension AAROM with Dowel  - 2 x daily - 7 x weekly - 2  sets - 10 reps  ASSESSMENT:  CLINICAL IMPRESSION: Patient is progressing appropriately post surgery. Educated him that we are still in the protective phase and reviewed precautions. He is not as apprehensive with supine shoulder flexion motions. Noted good scapular mobility with joint mobs. Patient will benefit from skilled PT to address the below impairments and improve overall function.    Eval: Patient is a 58 y.o. male who was seen today for physical therapy evaluation and treatment for right shoulder stiffness status post right shoulder coracoid transfer. Daymian presents with a history of a right shoulder dislocation that happened at work 5 years ago. Over the years his injury got worse and it restricted his function. His surgery date was Oct 3rd and based on  protocol he to wear the sling until Oct 31st. Patient did not have on the sling today and educated him on the importance of wearing until next week. Noted increased muscle guarding with right shoulder PROM, but motions were not painful and no increased restrictions were noted. Patient is not currently working, but his job duties required bending, lifting and stooping. Patient is highly motivated and wants to get back to his normal function. Patient will benefit from skilled PT to address the below impairments and improve overall function.    OBJECTIVE IMPAIRMENTS: decreased ROM, decreased strength, hypomobility, increased muscle spasms, impaired flexibility, impaired UE functional use, and pain.   ACTIVITY LIMITATIONS: carrying, lifting, bending, sleeping, dressing, reach over head, and hygiene/grooming  PARTICIPATION LIMITATIONS: meal prep, cleaning, laundry, community activity, and occupation  PERSONAL FACTORS: Fitness and 3+ comorbidities: HTN; Anxiety; Depression are also affecting patient's functional outcome.   REHAB POTENTIAL: Good  CLINICAL DECISION MAKING: Evolving/moderate complexity  EVALUATION COMPLEXITY: Low  GOALS: Goals reviewed with patient? Yes  SHORT TERM GOALS: Target date: 11/26/2023  Patient will be independent with initial HEP. Baseline:  Goal status: MET 11/08/2023  2.  .  Patient will demonstrate > or = to 110 degrees in Rt shoulder P/ROM for improved reaching. Baseline:  Goal status: INITIAL   3.  Patient will report > or = to 30% use of Rt UE with light home and self care tasks due to improved ROM and strength. Baseline:  Goal status: INITIAL    LONG TERM GOALS: Target date: 12/28/2023  Patient will demonstrate independence in advanced HEP. Baseline:  Goal status: INITIAL  2.  Patient will report > or = to 70% of normal use of Rt UE for ADLs due to improved functional strength and A/ROM. Baseline:  Goal status: INITIAL  3. .  Patient will  demonstrate > or = to 115 degrees in Rt shoulder A/ROM for improved reaching overhead. Baseline:  Goal status: INITIAL   4.  Patient will demonstrate correct bending and lifting technique for safe return to work with no limitations.  Baseline:  Goal status: INITIAL  5.  Patient will score > or = to 46/100 on Quick DASH due to improved right shoulder function. Baseline:  Goal status: INITIAL   PLAN: PT FREQUENCY: 2x/week  PT DURATION: 8 weeks  PLANNED INTERVENTIONS: 97164- PT Re-evaluation, 97110-Therapeutic exercises, 97530- Therapeutic activity, 97112- Neuromuscular re-education, 97535- Self Care, 02859- Manual therapy, [phone]- Canalith repositioning, [id number]- Aquatic Therapy, [phone]- Electrical stimulation (unattended), [phone]- Electrical stimulation (manual), [id number]- Vasopneumatic device, [id number]- Ultrasound, [id number]- Traction (mechanical), [id number]- Ionotophoresis 4mg /ml Dexamethasone, 79439 (1-2 muscles), 20561 (3+ muscles)- Dry Needling, Patient/Family education, Balance training, Stair training, Taping, Joint mobilization, Joint manipulation, Spinal manipulation, Spinal mobilization, Scar mobilization, Vestibular training, Cryotherapy, and  Moist heat  PLAN FOR NEXT SESSION: continue AA shoulder flexion exercises; follow protocol in media; right shoulder PROM   Kristeen Sar, PT, DPT 11/16/23 10:16 AM [facility] [address], [address] [place], [place] [address] Phone # [phone] Fax [phone]

## 2023-11-19 ENCOUNTER — Other Ambulatory Visit (HOSPITAL_COMMUNITY): Payer: Self-pay

## 2023-11-19 ENCOUNTER — Ambulatory Visit: Admitting: Physical Therapy

## 2023-11-19 DIAGNOSIS — M25511 Pain in right shoulder: Secondary | ICD-10-CM | POA: Insufficient documentation

## 2023-11-19 NOTE — Telephone Encounter (Signed)
 Pharmacy Patient Advocate Encounter  Received notification from [facility] MEDICAID that Prior Authorization for Dexlansoprazole 60MG  dr capsules has been APPROVED from 11-16-2023 to 11-15-2024   PA #/Case ID/Reference #: [id number]

## 2023-11-20 ENCOUNTER — Ambulatory Visit: Admitting: Rehabilitative and Restorative Service Providers"

## 2023-11-20 ENCOUNTER — Encounter: Payer: Self-pay | Admitting: Rehabilitative and Restorative Service Providers"

## 2023-11-20 ENCOUNTER — Ambulatory Visit: Admitting: Student in an Organized Health Care Education/Training Program

## 2023-11-20 DIAGNOSIS — M25611 Stiffness of right shoulder, not elsewhere classified: Secondary | ICD-10-CM | POA: Diagnosis not present

## 2023-11-20 DIAGNOSIS — G8929 Other chronic pain: Secondary | ICD-10-CM

## 2023-11-20 DIAGNOSIS — M6281 Muscle weakness (generalized): Secondary | ICD-10-CM

## 2023-11-20 DIAGNOSIS — M25511 Pain in right shoulder: Secondary | ICD-10-CM | POA: Diagnosis not present

## 2023-11-20 DIAGNOSIS — R293 Abnormal posture: Secondary | ICD-10-CM | POA: Diagnosis not present

## 2023-11-20 LAB — SURGICAL PATHOLOGY

## 2023-11-20 NOTE — Therapy (Signed)
 OUTPATIENT PHYSICAL THERAPY UPPER EXTREMITY TREATMENT   Patient Name: Alex Logan MRN: [id number] DOB:12-31-1965, 58 y.o., male Today's Date: 11/20/2023  END OF SESSION:  PT End of Session - 11/20/23 0802     Visit Number 7    Date for Recertification  12/28/23    Authorization Type [employer] Approved 16 vl 10/29/23-12/28/23-auth# [id number] (27 VL)    Authorization Time Period 10/29/23-12/28/23    Authorization - Visit Number 6    Authorization - Number of Visits 16    PT Start Time 0800    PT Stop Time 0840    PT Time Calculation (min) 40 min    Activity Tolerance Patient tolerated treatment well    Behavior During Therapy WFL for tasks assessed/performed               Past Medical History:  Diagnosis Date   Alcohol abuse    Anxiety    Depression    GERD (gastroesophageal reflux disease)    Hypertension    Past Surgical History:  Procedure Laterality Date   NO PAST SURGERIES     SHOULDER SURGERY Right 10/15/2023   Patient Active Problem List   Diagnosis Date Noted   Hiccups 05/30/2022   Alcohol withdrawal delirium, acute, hypoactive (HCC) 05/30/2022   AKI (acute kidney injury) 02/17/2022   Thrombocytopenia 08/11/2020   Anxiety 06/26/2020   Tachycardia 01/14/2020   Elevated LFTs 05/27/2017   Muscular abdominal pain in right flank 05/27/2017   Fatty liver, alcoholic 05/27/2017   Depression with anxiety 05/27/2017   Pancytopenia (HCC) 05/27/2017   SVT (supraventricular tachycardia)    HTN (hypertension) 05/23/2017   Acute vomiting    Pressure injury of skin 11/12/2016   Alcohol withdrawal (HCC) 11/10/2016   SIRS (systemic inflammatory response syndrome) (HCC) 11/10/2016   High anion gap metabolic acidosis 11/10/2016   Lactic acidosis 11/10/2016   Hyponatremia 11/10/2016   Starvation ketoacidosis 11/10/2016   Alcohol use disorder, severe, dependence (HCC) 09/27/2016   Alcohol abuse with alcohol-induced mood disorder (HCC) 09/22/2016    PCP:  None  REFERRING PROVIDER:    Brendan Gauze, PA-C    REFERRING DIAG: [phone] status post shoulder surgery   THERAPY DIAG:  Chronic right shoulder pain  Stiffness of right shoulder, not elsewhere classified  Abnormal posture  Muscle weakness (generalized)  Rationale for Evaluation and Treatment: Rehabilitation  ONSET DATE: Surgery Date Oct 3 , 2025  SUBJECTIVE:                                                                                                                                                                                      SUBJECTIVE STATEMENT: Patient reports that  the ortho told him that he needs to wait 6 months before he can have his knee surgery.  Denies shoulder pain, just states some discomfort.  Eval: Patient presents status post right shoulder coracoid transfer on Oct 12, 2023. He is supposed to be in the sling until Oct 31st, but he does not have it on today. He had a follow up appointment 10/26/2023 and everything is healing well. He dislocated it shoulder 5 years ago from work accident, and the injury just kept getting worse.  Hand dominance: Right  PERTINENT HISTORY: HTN; Anxiety; Depression  PAIN:  Are you having pain? Yes: NPRS scale: only shoulder discomfort with elevation but it is mild Pain location: coracoid process  Pain description: discomfort Aggravating factors: reaching Relieving factors: Tylenol    PRECAUTIONS: Shoulder see protocol in media  RED FLAGS: None   WEIGHT BEARING RESTRICTIONS: No  FALLS:  Has patient fallen in last 6 months? No  LIVING ENVIRONMENT: Lives with: lives alone Lives in: House/apartment Stairs: Yes: Internal: 12 steps; on right going up   OCCUPATION: Not currently working- Works at [employer] (standing, bending, lifting )  PLOF: Independent, Independent with basic ADLs, Independent with household mobility without device, Independent with community mobility without device, Independent with gait,  Independent with transfers, and Leisure: Walk  PATIENT GOALS: To get back to normal activities  NEXT MD VISIT: Week of Nov 10th   OBJECTIVE:  Note: Objective measures were completed at Evaluation unless otherwise noted.  DIAGNOSTIC FINDINGS:  04/12/2023 FINDINGS: Anterior-inferior dislocation of the right humeral head relative to the glenoid. No acute displaced fracture. There is no evidence of arthropathy or other focal bone abnormality. Soft tissues are unremarkable. Old fracture of the right posterior fifth rib.   IMPRESSION: Anterior right shoulder dislocation.  PATIENT SURVEYS :  Eval:  Quick Dash: 31.8/100 31.8%  11/20/2023:  Quick DASH 15.91%  Minimally Clinically Important Difference (MCID): 15-20 points   COGNITION: Overall cognitive status: Within functional limits for tasks assessed     SENSATION: WFL  POSTURE: Rounded shoulders & forward head  UPPER EXTREMITY ROM:   Eval: PROM : shoulder flexion in scapular plane to 90 degrees (no pain or restriction), Mild discomfort with PROM to ER to neutral  11/20/2023: Right shoulder flexion AA/ROM:  115 degrees  UPPER EXTREMITY MMT:   MMT Right eval Left eval  Shoulder flexion    Shoulder extension    Shoulder abduction    Shoulder adduction    Shoulder internal rotation    Shoulder external rotation    Middle trapezius    Lower trapezius    Elbow flexion    Elbow extension    Wrist flexion    Wrist extension    Wrist ulnar deviation    Wrist radial deviation    Wrist pronation    Wrist supination    Grip strength (lbs) 90 82  (Blank rows = not tested)   PALPATION:  Increased muscle spasms right biceps Increased muscle guarding to right shoulder PROM  TREATMENT DATE:   11/20/2023: Pulleys (flexion) x 2 mins  Finger ladder (flexion ) x 5 each ER/IR isometrics  10x5 Standing scapular retraction 2x10 with foam roll along spine Right shoulder AA/ROM 115 degrees Quick DASH 15.91 Supine AA flexion with dowel x 20 Seated ER with dowel to 30 deg x 20 Sidelying right manual scapular mobility   11/16/23 Pulleys (flexion) x 2 mins  Finger ladder (flexion ) x 5 each ER/IR isometrics 10x5 Standing scapular retraction 2x10 with foam roll along spine Supine P/ROM scaption to 120 + deg, ER to 30, elbow extension to end range ; SL scapula mobs (upward rotation & protraction) Supine AA flexion with dowel x 20 Seated ER with dowel to 30 deg x 20     11/15/23 Pulleys (flexion) x 2 mins  Standing scapular retraction 2x10 Red stability ball roll up at wall (flexion & abduction) x 10 (abduction more challenging) Seated ER with dowel to 30 deg x 20 Supine AA flexion with dowel x 20 ER/IR isometrics 15x5 Standing arm at side 5.0lb digiflex 2x20 reps S/L A/A scapula SL mobility Supine P/ROM scaption to 120 + deg, ER to 30, elbow extension to end range     PATIENT EDUCATION: Education details: PT eval findings, anticipated POC, progress with PT, and initial HEP Person educated: Patient Education method: Explanation, Demonstration, and Handouts Education comprehension: verbalized understanding, returned demonstration, and needs further education  HOME EXERCISE PROGRAM: Access Code: [email or web address] URL: [email or web address] Date: 11/15/2023 Prepared by: Mliss  Exercises - Seated Scapular Retraction  - 1 x daily - 7 x weekly - 1 sets - 10 reps - Seated Elbow Flexion and Extension AROM  - 1 x daily - 7 x weekly - 1 sets - 10 reps - Wrist Extension AROM  - 1 x daily - 7 x weekly - 1 sets - 10 reps - Isometric Shoulder Abduction at Wall  - 1 x daily - 7 x weekly - 1-2 sets - 5 reps - 5s hold - Seated Shoulder External Rotation AAROM with Cane and Hand in Neutral  - 2 x daily - 7 x weekly - 2 sets - 10 reps - Supine Shoulder Flexion Extension  AAROM with Dowel  - 2 x daily - 7 x weekly - 2 sets - 10 reps  ASSESSMENT:  CLINICAL IMPRESSION:  Mr Memmer presents to skilled PT reporting that he feels that he is able to do 80% of the activities at home that he needs to.  Patient states that he has been trying to not go past 120 degrees with right shoulder flexion and scaption.  Patient continues to progress with right shoulder isometric strengthening during session.  Patient has improved his score on the Quick DASH by 15%, and this PT adjusted long term goal to accurately reflect anticipated goal.  Patient continues to progress with ROM and has now met all short term goals and is progressing towards long term goals.   Eval: Patient is a 58 y.o. male who was seen today for physical therapy evaluation and treatment for right shoulder stiffness status post right shoulder coracoid transfer. Sopheap presents with a history of a right shoulder dislocation that happened at work 5 years ago. Over the years his injury got worse and it restricted his function. His surgery date was Oct 3rd and based on protocol he to wear the sling until Oct 31st. Patient did not have on the sling today and educated him on the importance of wearing until next week. Noted increased  muscle guarding with right shoulder PROM, but motions were not painful and no increased restrictions were noted. Patient is not currently working, but his job duties required bending, lifting and stooping. Patient is highly motivated and wants to get back to his normal function. Patient will benefit from skilled PT to address the below impairments and improve overall function.    OBJECTIVE IMPAIRMENTS: decreased ROM, decreased strength, hypomobility, increased muscle spasms, impaired flexibility, impaired UE functional use, and pain.   ACTIVITY LIMITATIONS: carrying, lifting, bending, sleeping, dressing, reach over head, and hygiene/grooming  PARTICIPATION LIMITATIONS: meal prep, cleaning,  laundry, community activity, and occupation  PERSONAL FACTORS: Fitness and 3+ comorbidities: HTN; Anxiety; Depression are also affecting patient's functional outcome.   REHAB POTENTIAL: Good  CLINICAL DECISION MAKING: Evolving/moderate complexity  EVALUATION COMPLEXITY: Low  GOALS: Goals reviewed with patient? Yes  SHORT TERM GOALS: Target date: 11/26/2023  Patient will be independent with initial HEP. Baseline:  Goal status: MET 11/08/2023  2.  .  Patient will demonstrate > or = to 110 degrees in Rt shoulder P/ROM for improved reaching. Baseline:  Goal status: Met on 11/20/23   3.  Patient will report > or = to 30% use of Rt UE with light home and self care tasks due to improved ROM and strength. Baseline:  Goal status: MET (reports 80% improvement on 11/20/23)    LONG TERM GOALS: Target date: 12/28/2023  Patient will demonstrate independence in advanced HEP. Baseline:  Goal status: Ongoing  2.  Patient will report > or = to 70% of normal use of Rt UE for ADLs due to improved functional strength and A/ROM. Baseline:  Goal status: Ongoing  3. .  Patient will demonstrate > or = to 115 degrees in Rt shoulder A/ROM for improved reaching overhead. Baseline:  Goal status: Ongoing   4.  Patient will demonstrate correct bending and lifting technique for safe return to work with no limitations.  Baseline:  Goal status: INITIAL  5.  Patient will score no greater than 12% on Quick DASH due to improved right shoulder function. Baseline: 31.8% Goal status: IN PROGRESS (adjusted goal on 11/20/23)   PLAN: PT FREQUENCY: 2x/week  PT DURATION: 8 weeks  PLANNED INTERVENTIONS: 97164- PT Re-evaluation, 97110-Therapeutic exercises, 97530- Therapeutic activity, 97112- Neuromuscular re-education, 97535- Self Care, 02859- Manual therapy, [phone]- Canalith repositioning, [id number]- Aquatic Therapy, [id number]- Electrical stimulation (unattended), [phone]- Electrical stimulation (manual), [id number]-  Vasopneumatic device, [id number]- Ultrasound, [id number]- Traction (mechanical), [id number]- Ionotophoresis 4mg /ml Dexamethasone, 79439 (1-2 muscles), [id number] (3+ muscles)- Dry Needling, Patient/Family education, Balance training, Stair training, Taping, Joint mobilization, Joint manipulation, Spinal manipulation, Spinal mobilization, Scar mobilization, Vestibular training, Cryotherapy, and Moist heat  PLAN FOR NEXT SESSION: continue AA shoulder flexion exercises; follow protocol in media/PT referral; right shoulder PROM     Jarrell Laming, PT, DPT 11/20/23, 10:00 AM  [facility] [address] [place], [place] [address] Phone # [phone] Fax [phone]

## 2023-11-22 ENCOUNTER — Ambulatory Visit: Admitting: Physical Therapy

## 2023-11-22 ENCOUNTER — Ambulatory Visit: Payer: Self-pay | Admitting: Gastroenterology

## 2023-11-26 ENCOUNTER — Ambulatory Visit: Admitting: Physical Therapy

## 2023-11-26 ENCOUNTER — Encounter: Payer: Self-pay | Admitting: Physical Therapy

## 2023-11-26 DIAGNOSIS — M6281 Muscle weakness (generalized): Secondary | ICD-10-CM

## 2023-11-26 DIAGNOSIS — R293 Abnormal posture: Secondary | ICD-10-CM

## 2023-11-26 DIAGNOSIS — M25511 Pain in right shoulder: Secondary | ICD-10-CM | POA: Diagnosis not present

## 2023-11-26 DIAGNOSIS — M25611 Stiffness of right shoulder, not elsewhere classified: Secondary | ICD-10-CM

## 2023-11-26 DIAGNOSIS — G8929 Other chronic pain: Secondary | ICD-10-CM | POA: Diagnosis not present

## 2023-11-26 NOTE — Therapy (Signed)
 OUTPATIENT PHYSICAL THERAPY UPPER EXTREMITY TREATMENT   Patient Name: Alex Logan MRN: [id number] DOB:[birth year], 58 y.o., male Today's Date: 11/26/2023  END OF SESSION:  PT End of Session - 11/26/23 1528     Visit Number 8    Date for Recertification  12/28/23    Authorization Type [employer] Approved 16 vl 10/29/23-12/28/23-auth# [id number] (27 VL)    Authorization Time Period 10/29/23-12/28/23    Authorization - Visit Number 7    Authorization - Number of Visits 16    PT Start Time 1404    PT Stop Time 1445    PT Time Calculation (min) 41 min    Activity Tolerance Patient tolerated treatment well    Behavior During Therapy WFL for tasks assessed/performed                Past Medical History:  Diagnosis Date   Alcohol abuse    Anxiety    Depression    GERD (gastroesophageal reflux disease)    Hypertension    Past Surgical History:  Procedure Laterality Date   NO PAST SURGERIES     SHOULDER SURGERY Right 10/15/2023   Patient Active Problem List   Diagnosis Date Noted   Hiccups 05/30/2022   Alcohol withdrawal delirium, acute, hypoactive (HCC) 05/30/2022   AKI (acute kidney injury) 02/17/2022   Thrombocytopenia 08/11/2020   Anxiety 06/26/2020   Tachycardia 01/14/2020   Elevated LFTs 05/27/2017   Muscular abdominal pain in right flank 05/27/2017   Fatty liver, alcoholic 05/27/2017   Depression with anxiety 05/27/2017   Pancytopenia (HCC) 05/27/2017   SVT (supraventricular tachycardia)    HTN (hypertension) 05/23/2017   Acute vomiting    Pressure injury of skin 11/12/2016   Alcohol withdrawal (HCC) 11/10/2016   SIRS (systemic inflammatory response syndrome) (HCC) 11/10/2016   High anion gap metabolic acidosis 11/10/2016   Lactic acidosis 11/10/2016   Hyponatremia 11/10/2016   Starvation ketoacidosis 11/10/2016   Alcohol use disorder, severe, dependence (HCC) 09/27/2016   Alcohol abuse with alcohol-induced mood disorder (HCC) 09/22/2016    PCP:  None  REFERRING PROVIDER:    Brendan Gauze, PA-C    REFERRING DIAG: [phone] status post shoulder surgery   THERAPY DIAG:  Chronic right shoulder pain  Stiffness of right shoulder, not elsewhere classified  Abnormal posture  Muscle weakness (generalized)  Rationale for Evaluation and Treatment: Rehabilitation  ONSET DATE: Surgery Date Oct 3 , 2025  SUBJECTIVE:                                                                                                                                                                                      SUBJECTIVE STATEMENT: Patient reports  he is okay today. Tired. He just started working at [employer].  Eval: Patient presents status post right shoulder coracoid transfer on Oct 12, 2023. He is supposed to be in the sling until Oct 31st, but he does not have it on today. He had a follow up appointment 10/26/2023 and everything is healing well. He dislocated it shoulder 5 years ago from work accident, and the injury just kept getting worse.  Hand dominance: Right  PERTINENT HISTORY: HTN; Anxiety; Depression  PAIN:  Are you having pain? Yes: NPRS scale: only shoulder discomfort with elevation but it is mild Pain location: coracoid process  Pain description: discomfort Aggravating factors: reaching Relieving factors: Tylenol    PRECAUTIONS: Shoulder see protocol in media  RED FLAGS: None   WEIGHT BEARING RESTRICTIONS: No  FALLS:  Has patient fallen in last 6 months? No  LIVING ENVIRONMENT: Lives with: lives alone Lives in: House/apartment Stairs: Yes: Internal: 12 steps; on right going up   OCCUPATION: Not currently working- Works at [employer] (standing, bending, lifting )  PLOF: Independent, Independent with basic ADLs, Independent with household mobility without device, Independent with community mobility without device, Independent with gait, Independent with transfers, and Leisure: Walk  PATIENT GOALS: To get back to  normal activities  NEXT MD VISIT: Week of Nov 10th   OBJECTIVE:  Note: Objective measures were completed at Evaluation unless otherwise noted.  DIAGNOSTIC FINDINGS:  04/12/2023 FINDINGS: Anterior-inferior dislocation of the right humeral head relative to the glenoid. No acute displaced fracture. There is no evidence of arthropathy or other focal bone abnormality. Soft tissues are unremarkable. Old fracture of the right posterior fifth rib.   IMPRESSION: Anterior right shoulder dislocation.  PATIENT SURVEYS :  Eval:  Quick Dash: 31.8/100 31.8%  11/20/2023:  Quick DASH 15.91%  Minimally Clinically Important Difference (MCID): 15-20 points   COGNITION: Overall cognitive status: Within functional limits for tasks assessed     SENSATION: WFL  POSTURE: Rounded shoulders & forward head  UPPER EXTREMITY ROM:   Eval: PROM : shoulder flexion in scapular plane to 90 degrees (no pain or restriction), Mild discomfort with PROM to ER to neutral  11/20/2023: Right shoulder flexion AA/ROM:  115 degrees  UPPER EXTREMITY MMT:   MMT Right eval Left eval  Shoulder flexion    Shoulder extension    Shoulder abduction    Shoulder adduction    Shoulder internal rotation    Shoulder external rotation    Middle trapezius    Lower trapezius    Elbow flexion    Elbow extension    Wrist flexion    Wrist extension    Wrist ulnar deviation    Wrist radial deviation    Wrist pronation    Wrist supination    Grip strength (lbs) 90 82  (Blank rows = not tested)   PALPATION:  Increased muscle spasms right biceps Increased muscle guarding to right shoulder PROM  TREATMENT DATE:  11/26/2023: UBE 2/2 - PT present to discuss status 3way scap stabilization with light green loop x 5 each direction bilateral  4D scap stabilization with ball blue ball x 20 each  direction (up/down;side/side/circles) Shoulder IR with pulley x 2 min Rt  Shoulder row with green TB 2 x 10 Shoulder extension with green 2 x 10 Standing shoulder ER with red TB 2 x 10 Standing shoulder IR with red TB 2 x 10 Supine shoulder flexion stretch with dowel an 3# AW x 10 3-4sec hold Supine serratus punch 2# DB 2 x 10 Supine shoulder alphabet 2# x 1 SL ER 0# 2 x 10 Rt     11/20/2023: Pulleys (flexion) x 2 mins  Finger ladder (flexion ) x 5 each ER/IR isometrics 10x5 Standing scapular retraction 2x10 with foam roll along spine Right shoulder AA/ROM 115 degrees Quick DASH 15.91 Supine AA flexion with dowel x 20 Seated ER with dowel to 30 deg x 20 Sidelying right manual scapular mobility   11/16/23 Pulleys (flexion) x 2 mins  Finger ladder (flexion ) x 5 each ER/IR isometrics 10x5 Standing scapular retraction 2x10 with foam roll along spine Supine P/ROM scaption to 120 + deg, ER to 30, elbow extension to end range ; SL scapula mobs (upward rotation & protraction) Supine AA flexion with dowel x 20 Seated ER with dowel to 30 deg x 20     11/15/23 Pulleys (flexion) x 2 mins  Standing scapular retraction 2x10 Red stability ball roll up at wall (flexion & abduction) x 10 (abduction more challenging) Seated ER with dowel to 30 deg x 20 Supine AA flexion with dowel x 20 ER/IR isometrics 15x5 Standing arm at side 5.0lb digiflex 2x20 reps S/L A/A scapula SL mobility Supine P/ROM scaption to 120 + deg, ER to 30, elbow extension to end range     PATIENT EDUCATION: Education details: PT eval findings, anticipated POC, progress with PT, and initial HEP Person educated: Patient Education method: Explanation, Demonstration, and Handouts Education comprehension: verbalized understanding, returned demonstration, and needs further education  HOME EXERCISE PROGRAM: Access Code: [email or web address] URL: [email or web address] Date: 11/26/2023 Prepared by: Kristeen Sar  Exercises - Seated Scapular Retraction  - 1 x daily - 7 x weekly - 1 sets - 10 reps - Seated Elbow Flexion and Extension AROM  - 1 x daily - 7 x weekly - 1 sets - 10 reps - Wrist Extension AROM  - 1 x daily - 7 x weekly - 1 sets - 10 reps - Isometric Shoulder Abduction at Wall  - 1 x daily - 7 x weekly - 1-2 sets - 5 reps - 5s hold - Seated Shoulder External Rotation AAROM with Cane and Hand in Neutral  - 2 x daily - 7 x weekly - 2 sets - 10 reps - Supine Shoulder Flexion Extension AAROM with Dowel  - 2 x daily - 7 x weekly - 2 sets - 10 reps - Scapular Stability on Wall With Resistance Band (Hemiplegia)   - 1 x daily - 7 x weekly - 1 sets - 5 reps  ASSESSMENT:  CLINICAL IMPRESSION: Tynan is 7 week post op today so we were able to progress shoulder stability and scapular strengthening today. Updated HEP to include exercise progressions. PT provided verbal and visual cues for correct exercise performance. Patient tolerated progressions well and did not verbalize any pain or discomfort.  Patient demonstrates good rehab potential to achieve stated goals through skilled therapy intervention.  Eval: Patient is a 58 y.o. male who was seen today for physical therapy evaluation and treatment for right shoulder stiffness status post right shoulder coracoid transfer. Kyo presents with a history of a right shoulder dislocation that happened at work 5 years ago. Over the years his injury got worse and it restricted his function. His surgery date was Oct 3rd and based on protocol he to wear the sling until Oct 31st. Patient did not have on the sling today and educated him on the importance of wearing until next week. Noted increased muscle guarding with right shoulder PROM, but motions were not painful and no increased restrictions were noted. Patient is not currently working, but his job duties required bending, lifting and stooping. Patient is highly motivated and wants to get back to his  normal function. Patient will benefit from skilled PT to address the below impairments and improve overall function.    OBJECTIVE IMPAIRMENTS: decreased ROM, decreased strength, hypomobility, increased muscle spasms, impaired flexibility, impaired UE functional use, and pain.   ACTIVITY LIMITATIONS: carrying, lifting, bending, sleeping, dressing, reach over head, and hygiene/grooming  PARTICIPATION LIMITATIONS: meal prep, cleaning, laundry, community activity, and occupation  PERSONAL FACTORS: Fitness and 3+ comorbidities: HTN; Anxiety; Depression are also affecting patient's functional outcome.   REHAB POTENTIAL: Good  CLINICAL DECISION MAKING: Evolving/moderate complexity  EVALUATION COMPLEXITY: Low  GOALS: Goals reviewed with patient? Yes  SHORT TERM GOALS: Target date: 11/26/2023  Patient will be independent with initial HEP. Baseline:  Goal status: MET 11/08/2023  2.  .  Patient will demonstrate > or = to 110 degrees in Rt shoulder P/ROM for improved reaching. Baseline:  Goal status: Met on 11/20/23   3.  Patient will report > or = to 30% use of Rt UE with light home and self care tasks due to improved ROM and strength. Baseline:  Goal status: MET (reports 80% improvement on 11/20/23)    LONG TERM GOALS: Target date: 12/28/2023  Patient will demonstrate independence in advanced HEP. Baseline:  Goal status: Ongoing  2.  Patient will report > or = to 70% of normal use of Rt UE for ADLs due to improved functional strength and A/ROM. Baseline:  Goal status: Ongoing  3. .  Patient will demonstrate > or = to 115 degrees in Rt shoulder A/ROM for improved reaching overhead. Baseline:  Goal status: Ongoing   4.  Patient will demonstrate correct bending and lifting technique for safe return to work with no limitations.  Baseline:  Goal status: INITIAL  5.  Patient will score no greater than 12% on Quick DASH due to improved right shoulder function. Baseline:  31.8% Goal status: IN PROGRESS (adjusted goal on 11/20/23)   PLAN: PT FREQUENCY: 2x/week  PT DURATION: 8 weeks  PLANNED INTERVENTIONS: [id number]- PT Re-evaluation, 97110-Therapeutic exercises, 97530- Therapeutic activity, 97112- Neuromuscular re-education, 97535- Self Care, [id number]- Manual therapy, [phone]- Canalith repositioning, [id number]- Aquatic Therapy, [phone]- Electrical stimulation (unattended), [phone]- Electrical stimulation (manual), [id number]- Vasopneumatic device, [id number]- Ultrasound, [id number]- Traction (mechanical), [id number]- Ionotophoresis 4mg /ml Dexamethasone, 79439 (1-2 muscles), 20561 (3+ muscles)- Dry Needling, Patient/Family education, Balance training, Stair training, Taping, Joint mobilization, Joint manipulation, Spinal manipulation, Spinal mobilization, Scar mobilization, Vestibular training, Cryotherapy, and Moist heat  PLAN FOR NEXT SESSION:assess tolerance to treatment session; continue shoulder + periscapular stability exercises; follow protocol in media/PT referral; right shoulder PROM      Kristeen Sar, PT, DPT 11/26/23 3:28 PM [facility] [address] [place], [place] [address] Phone # [phone]  Fax [phone]

## 2023-11-27 ENCOUNTER — Ambulatory Visit: Admitting: Student in an Organized Health Care Education/Training Program

## 2023-11-29 ENCOUNTER — Encounter: Payer: Self-pay | Admitting: Physical Therapy

## 2023-11-29 ENCOUNTER — Ambulatory Visit: Admitting: Physical Therapy

## 2023-11-29 DIAGNOSIS — G8929 Other chronic pain: Secondary | ICD-10-CM

## 2023-11-29 DIAGNOSIS — R293 Abnormal posture: Secondary | ICD-10-CM

## 2023-11-29 DIAGNOSIS — M6281 Muscle weakness (generalized): Secondary | ICD-10-CM

## 2023-11-29 DIAGNOSIS — M25611 Stiffness of right shoulder, not elsewhere classified: Secondary | ICD-10-CM

## 2023-11-29 DIAGNOSIS — M25511 Pain in right shoulder: Secondary | ICD-10-CM | POA: Diagnosis not present

## 2023-11-29 NOTE — Therapy (Signed)
 OUTPATIENT PHYSICAL THERAPY UPPER EXTREMITY TREATMENT   Patient Name: Alex Logan MRN: [id number] DOB:Oct 16, 1965, 58 y.o., male Today's Date: 11/29/2023  END OF SESSION:  PT End of Session - 11/29/23 1546     Visit Number 9    Date for Recertification  12/28/23    Authorization Type [employer] Approved 16 vl 10/29/23-12/28/23-auth# [id number] (27 VL)    Authorization Time Period 10/29/23-12/28/23    Authorization - Visit Number 8    Authorization - Number of Visits 16    PT Start Time 1447    PT Stop Time 1540    PT Time Calculation (min) 53 min    Activity Tolerance Patient tolerated treatment well    Behavior During Therapy WFL for tasks assessed/performed                 Past Medical History:  Diagnosis Date   Alcohol abuse    Anxiety    Depression    GERD (gastroesophageal reflux disease)    Hypertension    Past Surgical History:  Procedure Laterality Date   NO PAST SURGERIES     SHOULDER SURGERY Right 10/15/2023   Patient Active Problem List   Diagnosis Date Noted   Hiccups 05/30/2022   Alcohol withdrawal delirium, acute, hypoactive (HCC) 05/30/2022   AKI (acute kidney injury) 02/17/2022   Thrombocytopenia 08/11/2020   Anxiety 06/26/2020   Tachycardia 01/14/2020   Elevated LFTs 05/27/2017   Muscular abdominal pain in right flank 05/27/2017   Fatty liver, alcoholic 05/27/2017   Depression with anxiety 05/27/2017   Pancytopenia (HCC) 05/27/2017   SVT (supraventricular tachycardia)    HTN (hypertension) 05/23/2017   Acute vomiting    Pressure injury of skin 11/12/2016   Alcohol withdrawal (HCC) 11/10/2016   SIRS (systemic inflammatory response syndrome) (HCC) 11/10/2016   High anion gap metabolic acidosis 11/10/2016   Lactic acidosis 11/10/2016   Hyponatremia 11/10/2016   Starvation ketoacidosis 11/10/2016   Alcohol use disorder, severe, dependence (HCC) 09/27/2016   Alcohol abuse with alcohol-induced mood disorder (HCC) 09/22/2016    PCP:  None  REFERRING PROVIDER:    Brendan Gauze, PA-C    REFERRING DIAG: [phone] status post shoulder surgery   THERAPY DIAG:  Chronic right shoulder pain  Stiffness of right shoulder, not elsewhere classified  Abnormal posture  Muscle weakness (generalized)  Rationale for Evaluation and Treatment: Rehabilitation  ONSET DATE: Surgery Date Oct 3 , 2025  SUBJECTIVE:                                                                                                                                                                                      SUBJECTIVE STATEMENT: Patient  reports he is doing good today. He had a little soreness after last treatment session.  Eval: Patient presents status post right shoulder coracoid transfer on Oct 12, 2023. He is supposed to be in the sling until Oct 31st, but he does not have it on today. He had a follow up appointment 10/26/2023 and everything is healing well. He dislocated it shoulder 5 years ago from work accident, and the injury just kept getting worse.  Hand dominance: Right  PERTINENT HISTORY: HTN; Anxiety; Depression  PAIN:  Are you having pain? Yes: NPRS scale: only shoulder discomfort with elevation but it is mild Pain location: coracoid process  Pain description: discomfort Aggravating factors: reaching Relieving factors: Tylenol    PRECAUTIONS: Shoulder see protocol in media  RED FLAGS: None   WEIGHT BEARING RESTRICTIONS: No  FALLS:  Has patient fallen in last 6 months? No  LIVING ENVIRONMENT: Lives with: lives alone Lives in: House/apartment Stairs: Yes: Internal: 12 steps; on right going up   OCCUPATION: Not currently working- Works at [employer] (standing, bending, lifting )  PLOF: Independent, Independent with basic ADLs, Independent with household mobility without device, Independent with community mobility without device, Independent with gait, Independent with transfers, and Leisure: Walk  PATIENT GOALS:  To get back to normal activities  NEXT MD VISIT: Week of Nov 10th   OBJECTIVE:  Note: Objective measures were completed at Evaluation unless otherwise noted.  DIAGNOSTIC FINDINGS:  04/12/2023 FINDINGS: Anterior-inferior dislocation of the right humeral head relative to the glenoid. No acute displaced fracture. There is no evidence of arthropathy or other focal bone abnormality. Soft tissues are unremarkable. Old fracture of the right posterior fifth rib.   IMPRESSION: Anterior right shoulder dislocation.  PATIENT SURVEYS :  Eval:  Quick Dash: 31.8/100 31.8%  11/20/2023:  Quick DASH 15.91%  Minimally Clinically Important Difference (MCID): 15-20 points   COGNITION: Overall cognitive status: Within functional limits for tasks assessed     SENSATION: WFL  POSTURE: Rounded shoulders & forward head  UPPER EXTREMITY ROM:   Eval: PROM : shoulder flexion in scapular plane to 90 degrees (no pain or restriction), Mild discomfort with PROM to ER to neutral  11/20/2023: Right shoulder flexion AA/ROM:  115 degrees  UPPER EXTREMITY MMT:   MMT Right eval Left eval  Shoulder flexion    Shoulder extension    Shoulder abduction    Shoulder adduction    Shoulder internal rotation    Shoulder external rotation    Middle trapezius    Lower trapezius    Elbow flexion    Elbow extension    Wrist flexion    Wrist extension    Wrist ulnar deviation    Wrist radial deviation    Wrist pronation    Wrist supination    Grip strength (lbs) 90 82  (Blank rows = not tested)   PALPATION:  Increased muscle spasms right biceps Increased muscle guarding to right shoulder PROM  TREATMENT DATE:  11/29/2023: UBE 3/3 level 2.5 - PT present to discuss status Shoulder IR with pulley x 2 min Rt  4D scap stabilization with ball blue ball x 20 each direction  (up/down;side/side/circles) 3way scap stabilization with blue loop 2 x 5 each direction bilateral  Plank on wall + shoulder tap  x 10 (next time try the counter) Standing shoulder ER with red TB 2 x 10 Rt  Standing shoulder IR with red TB 2 x 10 Rt  Supine serratus punch 3# DB 2 x 10 Supine shoulder alphabet 3# x 1 SL ER 0# 2 x 10 Rt  Prone shoulder row, extension , abduction x 12 Rt  Biceps/ pec strech in doorway (60 degrees arm straight) 3 x 20 sec Rt     11/26/2023: UBE 2/2 - PT present to discuss status 3way scap stabilization with light green loop x 5 each direction bilateral  4D scap stabilization with ball blue ball x 20 each direction (up/down;side/side/circles) Shoulder IR with pulley x 2 min Rt  Shoulder row with green TB 2 x 10 Shoulder extension with green 2 x 10 Standing shoulder ER with red TB 2 x 10 Standing shoulder IR with red TB 2 x 10 Supine shoulder flexion stretch with dowel an 3# AW x 10 3-4sec hold Supine serratus punch 2# DB 2 x 10 Supine shoulder alphabet 2# x 1 SL ER 0# 2 x 10 Rt     11/20/2023: Pulleys (flexion) x 2 mins  Finger ladder (flexion ) x 5 each ER/IR isometrics 10x5 Standing scapular retraction 2x10 with foam roll along spine Right shoulder AA/ROM 115 degrees Quick DASH 15.91 Supine AA flexion with dowel x 20 Seated ER with dowel to 30 deg x 20 Sidelying right manual scapular mobility     PATIENT EDUCATION: Education details: PT eval findings, anticipated POC, progress with PT, and initial HEP Person educated: Patient Education method: Explanation, Demonstration, and Handouts Education comprehension: verbalized understanding, returned demonstration, and needs further education  HOME EXERCISE PROGRAM: Access Code: [email or web address] URL: [email or web address] Date: 11/26/2023 Prepared by: Kristeen Sar  Exercises - Seated Scapular Retraction  - 1 x daily - 7 x weekly - 1 sets - 10 reps - Seated Elbow Flexion and  Extension AROM  - 1 x daily - 7 x weekly - 1 sets - 10 reps - Wrist Extension AROM  - 1 x daily - 7 x weekly - 1 sets - 10 reps - Isometric Shoulder Abduction at Wall  - 1 x daily - 7 x weekly - 1-2 sets - 5 reps - 5s hold - Seated Shoulder External Rotation AAROM with Cane and Hand in Neutral  - 2 x daily - 7 x weekly - 2 sets - 10 reps - Supine Shoulder Flexion Extension AAROM with Dowel  - 2 x daily - 7 x weekly - 2 sets - 10 reps - Scapular Stability on Wall With Resistance Band (Hemiplegia)   - 1 x daily - 7 x weekly - 1 sets - 5 reps  ASSESSMENT:  CLINICAL IMPRESSION: Alex Logan presents with no pain currently, but he verbalized increased soreness after treatment session. Provided resistance bands for patient to perform periscapular strengthening exercises. Incorporated prone shoulder strengthening today. He had some anterior shoulder tightness with prone T so incorporated pec and bicep stretching at end of appointment. PT monitored patient throughout session and provided verbal and visual cues as needed.  Patient demonstrates good rehab potential to achieve stated goals through skilled therapy intervention.  Eval: Patient is a 58 y.o. male who was seen today for physical therapy evaluation and treatment for right shoulder stiffness status post right shoulder coracoid transfer. Alex Logan presents with a history of a right shoulder dislocation that happened at work 5 years ago. Over the years his injury got worse and it restricted his function. His surgery date was Oct 3rd and based on protocol he to wear the sling until Oct 31st. Patient did not have on the sling today and educated him on the importance of wearing until next week. Noted increased muscle guarding with right shoulder PROM, but motions were not painful and no increased restrictions were noted. Patient is not currently working, but his job duties required bending, lifting and stooping. Patient is highly motivated and wants to get  back to his normal function. Patient will benefit from skilled PT to address the below impairments and improve overall function.    OBJECTIVE IMPAIRMENTS: decreased ROM, decreased strength, hypomobility, increased muscle spasms, impaired flexibility, impaired UE functional use, and pain.   ACTIVITY LIMITATIONS: carrying, lifting, bending, sleeping, dressing, reach over head, and hygiene/grooming  PARTICIPATION LIMITATIONS: meal prep, cleaning, laundry, community activity, and occupation  PERSONAL FACTORS: Fitness and 3+ comorbidities: HTN; Anxiety; Depression are also affecting patient's functional outcome.   REHAB POTENTIAL: Good  CLINICAL DECISION MAKING: Evolving/moderate complexity  EVALUATION COMPLEXITY: Low  GOALS: Goals reviewed with patient? Yes  SHORT TERM GOALS: Target date: 11/26/2023  Patient will be independent with initial HEP. Baseline:  Goal status: MET 11/08/2023  2.  .  Patient will demonstrate > or = to 110 degrees in Rt shoulder P/ROM for improved reaching. Baseline:  Goal status: Met on 11/20/23   3.  Patient will report > or = to 30% use of Rt UE with light home and self care tasks due to improved ROM and strength. Baseline:  Goal status: MET (reports 80% improvement on 11/20/23)    LONG TERM GOALS: Target date: 12/28/2023  Patient will demonstrate independence in advanced HEP. Baseline:  Goal status: Ongoing  2.  Patient will report > or = to 70% of normal use of Rt UE for ADLs due to improved functional strength and A/ROM. Baseline:  Goal status: Ongoing  3. .  Patient will demonstrate > or = to 115 degrees in Rt shoulder A/ROM for improved reaching overhead. Baseline:  Goal status: Ongoing   4.  Patient will demonstrate correct bending and lifting technique for safe return to work with no limitations.  Baseline:  Goal status: INITIAL  5.  Patient will score no greater than 12% on Quick DASH due to improved right shoulder  function. Baseline: 31.8% Goal status: IN PROGRESS (adjusted goal on 11/20/23)   PLAN: PT FREQUENCY: 2x/week  PT DURATION: 8 weeks  PLANNED INTERVENTIONS: 97164- PT Re-evaluation, 97110-Therapeutic exercises, 97530- Therapeutic activity, 97112- Neuromuscular re-education, 97535- Self Care, [id number]- Manual therapy, [phone]- Canalith repositioning, [id number]- Aquatic Therapy, [phone]- Electrical stimulation (unattended), [phone]- Electrical stimulation (manual), [id number]- Vasopneumatic device, [id number]- Ultrasound, [id number]- Traction (mechanical), [id number]- Ionotophoresis 4mg /ml Dexamethasone, 79439 (1-2 muscles), [id number] (3+ muscles)- Dry Needling, Patient/Family education, Balance training, Stair training, Taping, Joint mobilization, Joint manipulation, Spinal manipulation, Spinal mobilization, Scar mobilization, Vestibular training, Cryotherapy, and Moist heat  PLAN FOR NEXT SESSION:assess soreness; continue shoulder + periscapular stability exercises; follow protocol in media/PT referral; right shoulder PROM      Kristeen Sar, PT, DPT 11/29/23 3:48 PM [facility] [address] [place], [place] [address] Phone # [phone] Fax [phone]

## 2023-12-03 ENCOUNTER — Ambulatory Visit: Admitting: Physical Therapy

## 2023-12-03 ENCOUNTER — Encounter: Payer: Self-pay | Admitting: Physical Therapy

## 2023-12-03 DIAGNOSIS — R293 Abnormal posture: Secondary | ICD-10-CM

## 2023-12-03 DIAGNOSIS — M25511 Pain in right shoulder: Secondary | ICD-10-CM | POA: Diagnosis not present

## 2023-12-03 DIAGNOSIS — M25611 Stiffness of right shoulder, not elsewhere classified: Secondary | ICD-10-CM

## 2023-12-03 DIAGNOSIS — G8929 Other chronic pain: Secondary | ICD-10-CM | POA: Diagnosis not present

## 2023-12-03 DIAGNOSIS — M6281 Muscle weakness (generalized): Secondary | ICD-10-CM

## 2023-12-03 NOTE — Therapy (Signed)
 OUTPATIENT PHYSICAL THERAPY UPPER EXTREMITY TREATMENT   Patient Name: Alex Logan MRN: [id number] DOB:September 07, 1965, 58 y.o., male Today's Date: 12/03/2023  END OF SESSION:  PT End of Session - 12/03/23 1443     Visit Number 10    Date for Recertification  12/28/23    Authorization Type [employer] Approved 16 vl 10/29/23-12/28/23-auth# [id number] (27 VL)    Authorization Time Period 10/29/23-12/28/23    Authorization - Visit Number 9    Authorization - Number of Visits 16    PT Start Time 1400    PT Stop Time 1443    PT Time Calculation (min) 43 min    Activity Tolerance Patient tolerated treatment well    Behavior During Therapy WFL for tasks assessed/performed                  Past Medical History:  Diagnosis Date   Alcohol abuse    Anxiety    Depression    GERD (gastroesophageal reflux disease)    Hypertension    Past Surgical History:  Procedure Laterality Date   NO PAST SURGERIES     SHOULDER SURGERY Right 10/15/2023   Patient Active Problem List   Diagnosis Date Noted   Hiccups 05/30/2022   Alcohol withdrawal delirium, acute, hypoactive (HCC) 05/30/2022   AKI (acute kidney injury) 02/17/2022   Thrombocytopenia 08/11/2020   Anxiety 06/26/2020   Tachycardia 01/14/2020   Elevated LFTs 05/27/2017   Muscular abdominal pain in right flank 05/27/2017   Fatty liver, alcoholic 05/27/2017   Depression with anxiety 05/27/2017   Pancytopenia (HCC) 05/27/2017   SVT (supraventricular tachycardia)    HTN (hypertension) 05/23/2017   Acute vomiting    Pressure injury of skin 11/12/2016   Alcohol withdrawal (HCC) 11/10/2016   SIRS (systemic inflammatory response syndrome) (HCC) 11/10/2016   High anion gap metabolic acidosis 11/10/2016   Lactic acidosis 11/10/2016   Hyponatremia 11/10/2016   Starvation ketoacidosis 11/10/2016   Alcohol use disorder, severe, dependence (HCC) 09/27/2016   Alcohol abuse with alcohol-induced mood disorder (HCC) 09/22/2016     PCP: None  REFERRING PROVIDER:    Brendan Gauze, PA-C    REFERRING DIAG: [phone] status post shoulder surgery   THERAPY DIAG:  Chronic right shoulder pain  Stiffness of right shoulder, not elsewhere classified  Abnormal posture  Muscle weakness (generalized)  Rationale for Evaluation and Treatment: Rehabilitation  ONSET DATE: Surgery Date Oct 3 , 2025  SUBJECTIVE:                                                                                                                                                                                      SUBJECTIVE STATEMENT:  Patient reports he is doing okay today. With lifting objects at work he has some anterior shoulder/ chest pain.  Eval: Patient presents status post right shoulder coracoid transfer on Oct 12, 2023. He is supposed to be in the sling until Oct 31st, but he does not have it on today. He had a follow up appointment 10/26/2023 and everything is healing well. He dislocated it shoulder 5 years ago from work accident, and the injury just kept getting worse.  Hand dominance: Right  PERTINENT HISTORY: HTN; Anxiety; Depression  PAIN:  Are you having pain? Yes: NPRS scale: only shoulder discomfort with elevation but it is mild Pain location: coracoid process  Pain description: discomfort Aggravating factors: reaching Relieving factors: Tylenol    PRECAUTIONS: Shoulder see protocol in media  RED FLAGS: None   WEIGHT BEARING RESTRICTIONS: No  FALLS:  Has patient fallen in last 6 months? No  LIVING ENVIRONMENT: Lives with: lives alone Lives in: House/apartment Stairs: Yes: Internal: 12 steps; on right going up   OCCUPATION: Not currently working- Works at [employer] (standing, bending, lifting )  PLOF: Independent, Independent with basic ADLs, Independent with household mobility without device, Independent with community mobility without device, Independent with gait, Independent with transfers, and  Leisure: Walk  PATIENT GOALS: To get back to normal activities  NEXT MD VISIT: Week of Nov 10th   OBJECTIVE:  Note: Objective measures were completed at Evaluation unless otherwise noted.  DIAGNOSTIC FINDINGS:  04/12/2023 FINDINGS: Anterior-inferior dislocation of the right humeral head relative to the glenoid. No acute displaced fracture. There is no evidence of arthropathy or other focal bone abnormality. Soft tissues are unremarkable. Old fracture of the right posterior fifth rib.   IMPRESSION: Anterior right shoulder dislocation.  PATIENT SURVEYS :  Eval:  Quick Dash: 31.8/100 31.8%  11/20/2023:  Quick DASH 15.91%  Minimally Clinically Important Difference (MCID): 15-20 points   COGNITION: Overall cognitive status: Within functional limits for tasks assessed     SENSATION: WFL  POSTURE: Rounded shoulders & forward head  UPPER EXTREMITY ROM:   Eval: PROM : shoulder flexion in scapular plane to 90 degrees (no pain or restriction), Mild discomfort with PROM to ER to neutral  11/20/2023: Right shoulder flexion AA/ROM:  115 degrees    12/03/2023 Right A/ROM: Flexion 140 degrees Abduction 135 degrees IR:T8 ER: T2  UPPER EXTREMITY MMT:   MMT Right eval Left eval  Shoulder flexion    Shoulder extension    Shoulder abduction    Shoulder adduction    Shoulder internal rotation    Shoulder external rotation    Middle trapezius    Lower trapezius    Elbow flexion    Elbow extension    Wrist flexion    Wrist extension    Wrist ulnar deviation    Wrist radial deviation    Wrist pronation    Wrist supination    Grip strength (lbs) 90 82  (Blank rows = not tested)   PALPATION:  Increased muscle spasms right biceps Increased muscle guarding to right shoulder PROM  TREATMENT DATE:  12/03/2023: UBE 3/3 level 2.5 - PT present to  discuss status 3way scap stabilization with blue loop x 8 each direction bilateral  Iso shoulder abduction against blue loop + shoulder flexion 2 x 8 Counter push up 2 x 5 Lat pull down 30# 2 x 10 Seated shoulder flexion & scaption 2# x 10 each direction Shoulder row & extension with green TB 2 x 10 ROM assessment look above Prone shoulder row, extension , abduction 2 x 12 Rt  SL ER 0# 2 x 10 Rt  Seated shoulder ER with green TB 2 x 10 Seated shoulder abduction with green TB 2 x 10 Biceps/ pec strech in doorway (60 degrees arm straight) 3 x 20 sec Rt     11/29/2023: UBE 3/3 level 2.5 - PT present to discuss status Shoulder IR with pulley x 2 min Rt  4D scap stabilization with ball blue ball x 20 each direction (up/down;side/side/circles) 3way scap stabilization with blue loop 2 x 5 each direction bilateral  Plank on wall + shoulder tap  x 10 (next time try the counter) Standing shoulder ER with red TB 2 x 10 Rt  Standing shoulder IR with red TB 2 x 10 Rt  Supine serratus punch 3# DB 2 x 10 Supine shoulder alphabet 3# x 1 SL ER 0# 2 x 10 Rt  Prone shoulder row, extension , abduction x 12 Rt  Biceps/ pec strech in doorway (60 degrees arm straight) 3 x 20 sec Rt     11/26/2023: UBE 2/2 - PT present to discuss status 3way scap stabilization with light green loop x 5 each direction bilateral  4D scap stabilization with ball blue ball x 20 each direction (up/down;side/side/circles) Shoulder IR with pulley x 2 min Rt  Shoulder row with green TB 2 x 10 Shoulder extension with green 2 x 10 Standing shoulder ER with red TB 2 x 10 Standing shoulder IR with red TB 2 x 10 Supine shoulder flexion stretch with dowel an 3# AW x 10 3-4sec hold Supine serratus punch 2# DB 2 x 10 Supine shoulder alphabet 2# x 1 SL ER 0# 2 x 10 Rt      PATIENT EDUCATION: Education details: PT eval findings, anticipated POC, progress with PT, and initial HEP Person educated: Patient Education method:  Explanation, Demonstration, and Handouts Education comprehension: verbalized understanding, returned demonstration, and needs further education  HOME EXERCISE PROGRAM: Access Code: [email or web address] URL: [email or web address] Date: 11/26/2023 Prepared by: Kristeen Sar  Exercises - Seated Scapular Retraction  - 1 x daily - 7 x weekly - 1 sets - 10 reps - Seated Elbow Flexion and Extension AROM  - 1 x daily - 7 x weekly - 1 sets - 10 reps - Wrist Extension AROM  - 1 x daily - 7 x weekly - 1 sets - 10 reps - Isometric Shoulder Abduction at Wall  - 1 x daily - 7 x weekly - 1-2 sets - 5 reps - 5s hold - Seated Shoulder External Rotation AAROM with Cane and Hand in Neutral  - 2 x daily - 7 x weekly - 2 sets - 10 reps - Supine Shoulder Flexion Extension AAROM with Dowel  - 2 x daily - 7 x weekly - 2 sets - 10 reps - Scapular Stability on Wall With Resistance Band (Hemiplegia)   - 1 x daily - 7 x weekly - 1 sets - 5 reps  ASSESSMENT:  CLINICAL IMPRESSION: Brandol presents with no pain currently. At  work with reaching activities he verbalizes some discomfort with overhead reaching tasks. He described it as a stretching sensation. Treatment session focused on progression periscapular strengthening exercises. He tolerated exercise and weight progressions well today. See updated ROM measurements above. PT monitored patient response throughout and provided cues as needed. Patient verbalized feeling 80% better since starting skilled therapy.  Patient demonstrates good rehab potential to achieve stated goals through skilled therapy intervention.      Eval: Patient is a 58 y.o. male who was seen today for physical therapy evaluation and treatment for right shoulder stiffness status post right shoulder coracoid transfer. Shykeem presents with a history of a right shoulder dislocation that happened at work 5 years ago. Over the years his injury got worse and it restricted his function. His surgery date  was Oct 3rd and based on protocol he to wear the sling until Oct 31st. Patient did not have on the sling today and educated him on the importance of wearing until next week. Noted increased muscle guarding with right shoulder PROM, but motions were not painful and no increased restrictions were noted. Patient is not currently working, but his job duties required bending, lifting and stooping. Patient is highly motivated and wants to get back to his normal function. Patient will benefit from skilled PT to address the below impairments and improve overall function.    OBJECTIVE IMPAIRMENTS: decreased ROM, decreased strength, hypomobility, increased muscle spasms, impaired flexibility, impaired UE functional use, and pain.   ACTIVITY LIMITATIONS: carrying, lifting, bending, sleeping, dressing, reach over head, and hygiene/grooming  PARTICIPATION LIMITATIONS: meal prep, cleaning, laundry, community activity, and occupation  PERSONAL FACTORS: Fitness and 3+ comorbidities: HTN; Anxiety; Depression are also affecting patient's functional outcome.   REHAB POTENTIAL: Good  CLINICAL DECISION MAKING: Evolving/moderate complexity  EVALUATION COMPLEXITY: Low  GOALS: Goals reviewed with patient? Yes  SHORT TERM GOALS: Target date: 11/26/2023  Patient will be independent with initial HEP. Baseline:  Goal status: MET 11/08/2023  2.  .  Patient will demonstrate > or = to 110 degrees in Rt shoulder P/ROM for improved reaching. Baseline:  Goal status: Met on 11/20/23   3.  Patient will report > or = to 30% use of Rt UE with light home and self care tasks due to improved ROM and strength. Baseline:  Goal status: MET (reports 80% improvement on 11/20/23)    LONG TERM GOALS: Target date: 12/28/2023  Patient will demonstrate independence in advanced HEP. Baseline:  Goal status: Ongoing  2.  Patient will report > or = to 70% of normal use of Rt UE for ADLs due to improved functional strength and  A/ROM. Baseline:  Goal status: MET 80% 12/03/2023  3. .  Patient will demonstrate > or = to 115 degrees in Rt shoulder A/ROM for improved reaching overhead. Baseline:  Goal status: Ongoing   4.  Patient will demonstrate correct bending and lifting technique for safe return to work with no limitations.  Baseline:  Goal status: INITIAL  5.  Patient will score no greater than 12% on Quick DASH due to improved right shoulder function. Baseline: 31.8% Goal status: IN PROGRESS (adjusted goal on 11/20/23)   PLAN: PT FREQUENCY: 2x/week  PT DURATION: 8 weeks  PLANNED INTERVENTIONS: 97164- PT Re-evaluation, 97110-Therapeutic exercises, 97530- Therapeutic activity, [id number]- Neuromuscular re-education, 97535- Self Care, 02859- Manual therapy, [id number]- Canalith repositioning, [id number]- Aquatic Therapy, H9716- Electrical stimulation (unattended), [id number]- Electrical stimulation (manual), [id number]- Vasopneumatic device, [id number]- Ultrasound, [id number]- Traction (mechanical), [id number]- Ionotophoresis 4mg /ml Dexamethasone, 20560 (  1-2 muscles), 20561 (3+ muscles)- Dry Needling, Patient/Family education, Balance training, Stair training, Taping, Joint mobilization, Joint manipulation, Spinal manipulation, Spinal mobilization, Scar mobilization, Vestibular training, Cryotherapy, and Moist heat  PLAN FOR NEXT SESSION: continue shoulder + periscapular stability exercises; follow protocol in media/PT referral; right shoulder PROM      Kristeen Sar, PT, DPT 12/03/23 2:44 PM [facility] [address] [place], [place] [address] Phone # [phone] Fax [phone]

## 2023-12-04 ENCOUNTER — Encounter: Payer: Self-pay | Admitting: Student in an Organized Health Care Education/Training Program

## 2023-12-04 ENCOUNTER — Ambulatory Visit: Admitting: Student in an Organized Health Care Education/Training Program

## 2023-12-04 VITALS — BP 123/88 | HR 80 | Ht 68.5 in | Wt 170.0 lb

## 2023-12-04 DIAGNOSIS — I10 Essential (primary) hypertension: Secondary | ICD-10-CM | POA: Diagnosis not present

## 2023-12-04 DIAGNOSIS — F418 Other specified anxiety disorders: Secondary | ICD-10-CM | POA: Diagnosis not present

## 2023-12-04 DIAGNOSIS — F1021 Alcohol dependence, in remission: Secondary | ICD-10-CM

## 2023-12-04 DIAGNOSIS — R7303 Prediabetes: Secondary | ICD-10-CM

## 2023-12-04 DIAGNOSIS — M1712 Unilateral primary osteoarthritis, left knee: Secondary | ICD-10-CM | POA: Diagnosis not present

## 2023-12-04 DIAGNOSIS — E291 Testicular hypofunction: Secondary | ICD-10-CM | POA: Diagnosis not present

## 2023-12-04 DIAGNOSIS — K219 Gastro-esophageal reflux disease without esophagitis: Secondary | ICD-10-CM | POA: Diagnosis not present

## 2023-12-04 DIAGNOSIS — H6123 Impacted cerumen, bilateral: Secondary | ICD-10-CM

## 2023-12-04 DIAGNOSIS — Z13220 Encounter for screening for lipoid disorders: Secondary | ICD-10-CM | POA: Diagnosis not present

## 2023-12-04 DIAGNOSIS — F1011 Alcohol abuse, in remission: Secondary | ICD-10-CM | POA: Insufficient documentation

## 2023-12-04 NOTE — Progress Notes (Signed)
 New Patient Office Visit  Patient ID: Alex Logan, Male   DOB: [birth year] 58 y.o. MRN: [id number]  Chief Complaint  Patient presents with   Establish Care    Low testosterone   Cologuard  Has had flu and covid  No to PCV    Subjective:     Alex Logan presents to establish care  HPI  Discussed the use of AI scribe software for clinical note transcription with the patient, who gave verbal consent to proceed.  History of Present Illness Alex Logan is a 58 year old male who presents for a checkup and management of testosterone  replacement therapy.  He was released from prison in August and is looking to continue his testosterone  replacement therapy (TRT), which he has been on for approximately 18 years. He has a history of low testosterone  and has been on TRT for approximately 18 years, previously using a gel applied to the shoulders and later switching to a low-dose pill taken daily. These treatments were effective in maintaining his testosterone  levels and improving his mood and energy. However, he has not used testosterone  for over a year due to his incarceration. Recent blood work at [facility] indicated low testosterone  levels.  He has a history of alcohol use disorder and was incarcerated for four months for DWI. He no longer drinks alcohol. Initially, he used naltrexone  to help with cravings but discontinued it due to concerns about liver health. He has no current cravings for alcohol and feels better overall since abstaining from drinking.  He has a history of a hiatal hernia diagnosed via endoscopy in November, which measures five centimeters. He experiences symptoms of acid reflux and is currently on medication for this condition. He reports occasional vomiting and is monitoring his symptoms closely.  He underwent shoulder surgery for a torn ligament, which was repaired with two screws. He is currently undergoing physical therapy and reports satisfactory  recovery. He also has a left knee condition described as 'bone on bone.'  His current medications include Lexapro  for depression, lisinopril  and chlorthalidone for blood pressure, and Dexilant  for acid reflux. He was previously on propranolol , but he is uncertain of its purpose and is considering discontinuing it. He monitors his blood pressure regularly and reports it is well-controlled.  He has a history of elevated liver enzymes, which he attributes to past alcohol use. He has no known heart problems, although he was once told he had a heart murmur, which was not confirmed in subsequent evaluations. No history of heart attacks or strokes.   Outpatient Encounter Medications as of 12/04/2023  Medication Sig   chlorthalidone (HYGROTON) 25 MG tablet Take 25 mg by mouth daily.   dexlansoprazole  (DEXILANT ) 60 MG capsule Take 1 capsule (60 mg total) by mouth daily.   escitalopram  (LEXAPRO ) 20 MG tablet Take 10 mg by mouth daily.   lisinopril  (ZESTRIL ) 20 MG tablet Take 20 mg by mouth daily.   [DISCONTINUED] propranolol  ER (INDERAL  LA) 80 MG 24 hr capsule Take 1 capsule (80 mg total) by mouth daily.   No facility-administered encounter medications on file as of 12/04/2023.    Past Medical History:  Diagnosis Date   Alcohol abuse    Anxiety    Depression    GERD (gastroesophageal reflux disease)    Hypertension     Past Surgical History:  Procedure Laterality Date   NO PAST SURGERIES     SHOULDER SURGERY Right 10/15/2023    Family History  Problem Relation Age  of Onset   Emphysema Mother    COPD Mother    Bipolar disorder Father    Alcohol abuse Father    Hypertension Father    Alcoholism Paternal Grandmother    Alcoholism Paternal Grandfather    Mental illness Neg Hx    Colon cancer Neg Hx    Esophageal cancer Neg Hx    Pancreatic cancer Neg Hx    Stomach cancer Neg Hx    Rectal cancer Neg Hx      Objective:    BP 123/88   Pulse 80   Ht 5' 8.5 (1.74 m)   Wt 170 lb  (77.1 kg)   SpO2 98%   BMI 25.47 kg/m   Physical Exam  Gen: Well-appearing man Ears: Bilateral ear canals are impacted with cerumen.  This was removed with a curette bilaterally.  Tympanic membranes are normal. Neck: Normal thyroid, no nodules or adenopathy Heart: Regular, no murmur.  Chest has mild gynecomastia. Lungs: Unlabored, clear throughout Abd: Soft, nontender, no hepatomegaly, no ascites Ext: Warm, no edema Neuro: Alert, conversational, no encephalopathy, full strength upper and lower extremities, no asterixis Psych: Appropriate mood and affect, not anxious or depressed.     Assessment & Plan:   Problem List Items Addressed This Visit       High   Alcohol use disorder, severe, in sustained remission (HCC) (Chronic)   His alcohol use disorder is in remission, with no cravings or alcohol use, leading to an improved quality of life. He should continue attending meetings, and naltrexone  is offered if cravings or risk of relapse increase.      Relevant Orders   Comprehensive metabolic panel with GFR   Hypertension (Chronic)   His hypertension is well-controlled with current medication, and blood pressure readings are stable. He should continue lisinopril  and chlorthalidone.      Relevant Medications   chlorthalidone (HYGROTON) 25 MG tablet   Other Relevant Orders   Comprehensive metabolic panel with GFR   Primary testicular failure - Primary (Chronic)   He has a history of testosterone  replacement therapy (TRT) use, with a recent testosterone  level of 235.  I recommended we repeat this. A morning testosterone  level test has been ordered, and [facility] from [facility] will be obtained for previous testosterone  levels. TRT will be initiated if low levels are confirmed.  Patient is not interested in fertility, has had a vasectomy.      Relevant Orders   CBC   Testosterone      Medium    Depression with anxiety (Chronic)   His depression is managed with  Lexapro  20 mg, with an improved mood and outlook. A psychiatrist visit is scheduled for December 1st for medication review. He should continue Lexapro  20 mg and follow up with the psychiatrist as planned.      Gastroesophageal reflux disease (Chronic)   He has a hiatal hernia with GERD, with a recent endoscopy showing a 5 cm hernia. Despite being on medication, he experiences vomiting. He should continue current GERD medication, and symptoms will be monitored with treatment adjustments as needed.  No varices or Barrett's changes were seen on the endoscopy.      Prediabetes (Chronic)   Relevant Orders   Hemoglobin A1c     Low   Osteoarthritis of left knee (Chronic)   He has severe osteoarthritis with bone-on-bone contact, and surgery is deferred for six months while he undergoes physical therapy. He should continue physical therapy, and reassessment for knee replacement surgery  will occur in six months.      Cerumen debris on tympanic membrane, bilateral   Procedure Note: Manual Removal of Impacted Cerumen Using a Curette   Indication:  Cerumen impaction causing symptoms (e.g., hearing loss, pain, tinnitus) or preventing assessment of the ear canal and tympanic membrane.  Procedure:  Explained the procedure to the patient and informed consent was obtained  Review patient history for contraindications (e.g., nonintact tympanic membrane, history of ear surgery, anatomical abnormalities).  After a position of the patient's head upright, I visualized the ear canal and cerumen using an otoscope.  I gently inserted the curette into the ear canal avoiding contact with the canal walls, and carefully scooped the cerumen, removing it in small pieces. This was completed bilaterally.  I reassessed the ear canal and tympanic membrane, there was no residual cerumen nor signs of trauma.  Follow-Up:  I instructed the patient to report any persistent symptoms such as pain, discharge, or hearing loss.   Schedule a follow-up appointment if necessary.       Other Visit Diagnoses       Screening for lipid disorders       Relevant Orders   Lipid panel       Return in about 3 months (around 03/05/2024).   Cleatus Debby Specking, MD [facility]

## 2023-12-04 NOTE — Assessment & Plan Note (Signed)
 He has severe osteoarthritis with bone-on-bone contact, and surgery is deferred for six months while he undergoes physical therapy. He should continue physical therapy, and reassessment for knee replacement surgery will occur in six months.

## 2023-12-04 NOTE — Assessment & Plan Note (Signed)
 His depression is managed with Lexapro  20 mg, with an improved mood and outlook. A psychiatrist visit is scheduled for December 1st for medication review. He should continue Lexapro  20 mg and follow up with the psychiatrist as planned.

## 2023-12-04 NOTE — Patient Instructions (Signed)
  VISIT SUMMARY: Today, you came in for a checkup and to discuss the management of your testosterone  replacement therapy (TRT). We reviewed your medical history, including your recent release from prison, your history of low testosterone , alcohol use disorder, hiatal hernia, shoulder surgery, knee condition, and current medications. We also discussed your recent blood work and overall health maintenance.  YOUR PLAN: -LOW TESTOSTERONE : Low testosterone  means your body is not producing enough testosterone , which can affect your mood, energy, and overall health. We have ordered a morning testosterone  level test and will obtain your previous testosterone  levels from [facility]. If your levels are confirmed to be low, we will restart your TRT.  -ALCOHOL USE DISORDER, IN REMISSION: Your alcohol use disorder is currently in remission, meaning you are not experiencing cravings or using alcohol, which has improved your quality of life. Continue attending meetings, and if cravings or the risk of relapse increase, naltrexone  is available to help.  -LEFT KNEE OSTEOARTHRITIS, SEVERE: Osteoarthritis is a condition where the cartilage in your joints wears down, causing pain and stiffness. You have severe osteoarthritis in your left knee. Continue with physical therapy, and we will reassess the need for knee replacement surgery in six months.  -STATUS POST LEFT SHOULDER LIGAMENT REPAIR: You had surgery to repair a torn ligament in your left shoulder, and you are recovering well with physical therapy. Continue with your physical therapy sessions.  -HIATAL HERNIA WITH GASTROESOPHAGEAL REFLUX DISEASE (GERD): A hiatal hernia occurs when part of your stomach pushes up through your diaphragm, which can cause acid reflux. Continue taking your GERD medication, and we will monitor your symptoms and adjust treatment as needed.  -HYPERTENSION: Hypertension means high blood pressure. Your blood pressure is  well-controlled with your current medications, lisinopril  and chlorthalidone. Continue taking these medications as prescribed.  -DEPRESSION: Depression is a mood disorder that causes persistent feelings of sadness and loss of interest. Your depression is managed with Lexapro , and you have an upcoming appointment with a psychiatrist on December 1st. Continue taking Lexapro  20 mg and follow up with the psychiatrist as planned.  -HYPERLIPIDEMIA, BORDERLINE: Hyperlipidemia means you have high levels of lipids (fats) in your blood, which can increase your risk of heart disease. Your levels are borderline, so we will continue to monitor them.  -PREDIABETES: Prediabetes means your blood sugar levels are higher than normal but not high enough to be classified as diabetes. We will continue to monitor your blood glucose levels.  -CERUMEN IMPACTION, BILATERAL: Cerumen impaction means you have a buildup of earwax in both ears. Currently, you have no acute symptoms, so no immediate treatment is necessary.  INSTRUCTIONS: Please follow up with the psyc hiatrist on December 1st for your medication review. Additionally, we will reassess your knee condition in six months to determine if knee replacement surgery is needed. Continue monitoring your blood pressure and blood glucose levels regularly.

## 2023-12-04 NOTE — Assessment & Plan Note (Addendum)
 He has a hiatal hernia with GERD, with a recent endoscopy showing a 5 cm hernia. Despite being on medication, he experiences vomiting. He should continue current GERD medication, and symptoms will be monitored with treatment adjustments as needed.  No varices or Barrett's changes were seen on the endoscopy.

## 2023-12-04 NOTE — Assessment & Plan Note (Signed)
 Procedure Note: Manual Removal of Impacted Cerumen Using a Curette   Indication:  Cerumen impaction causing symptoms (e.g., hearing loss, pain, tinnitus) or preventing assessment of the ear canal and tympanic membrane.  Procedure:  Explained the procedure to the patient and informed consent was obtained  Review patient history for contraindications (e.g., nonintact tympanic membrane, history of ear surgery, anatomical abnormalities).  After a position of the patient's head upright, I visualized the ear canal and cerumen using an otoscope.  I gently inserted the curette into the ear canal avoiding contact with the canal walls, and carefully scooped the cerumen, removing it in small pieces. This was completed bilaterally.  I reassessed the ear canal and tympanic membrane, there was no residual cerumen nor signs of trauma.  Follow-Up:  I instructed the patient to report any persistent symptoms such as pain, discharge, or hearing loss.  Schedule a follow-up appointment if necessary.

## 2023-12-04 NOTE — Assessment & Plan Note (Signed)
 He has a history of testosterone  replacement therapy (TRT) use, with a recent testosterone  level of 235.  I recommended we repeat this. A morning testosterone  level test has been ordered, and records from [facility] will be obtained for previous testosterone  levels. TRT will be initiated if low levels are confirmed.  Patient is not interested in fertility, has had a vasectomy.

## 2023-12-04 NOTE — Assessment & Plan Note (Signed)
 His hypertension is well-controlled with current medication, and blood pressure readings are stable. He should continue lisinopril  and chlorthalidone.

## 2023-12-04 NOTE — Assessment & Plan Note (Signed)
 His alcohol use disorder is in remission, with no cravings or alcohol use, leading to an improved quality of life. He should continue attending meetings, and naltrexone  is offered if cravings or risk of relapse increase.

## 2023-12-05 ENCOUNTER — Ambulatory Visit: Admitting: Physical Therapy

## 2023-12-05 ENCOUNTER — Other Ambulatory Visit

## 2023-12-05 DIAGNOSIS — I10 Essential (primary) hypertension: Secondary | ICD-10-CM | POA: Diagnosis not present

## 2023-12-05 DIAGNOSIS — R7303 Prediabetes: Secondary | ICD-10-CM

## 2023-12-05 DIAGNOSIS — F1021 Alcohol dependence, in remission: Secondary | ICD-10-CM | POA: Diagnosis not present

## 2023-12-05 DIAGNOSIS — Z13220 Encounter for screening for lipoid disorders: Secondary | ICD-10-CM

## 2023-12-05 DIAGNOSIS — E291 Testicular hypofunction: Secondary | ICD-10-CM | POA: Diagnosis not present

## 2023-12-05 LAB — COMPREHENSIVE METABOLIC PANEL WITH GFR
ALT: 26 U/L (ref 0–53)
AST: 33 U/L (ref 0–37)
Albumin: 4.9 g/dL (ref 3.5–5.2)
Alkaline Phosphatase: 40 U/L (ref 39–117)
BUN: 28 mg/dL — ABNORMAL HIGH (ref 6–23)
CO2: 22 meq/L (ref 19–32)
Calcium: 10 mg/dL (ref 8.4–10.5)
Chloride: 92 meq/L — ABNORMAL LOW (ref 96–112)
Creatinine, Ser: 1.38 mg/dL (ref 0.40–1.50)
GFR: 56.87 mL/min — ABNORMAL LOW (ref >60.00)
Glucose, Bld: 121 mg/dL — ABNORMAL HIGH (ref 70–99)
Potassium: 3.8 meq/L (ref 3.5–5.1)
Sodium: 129 meq/L — ABNORMAL LOW (ref 135–145)
Total Bilirubin: 0.6 mg/dL (ref 0.2–1.2)
Total Protein: 7.8 g/dL (ref 6.0–8.3)

## 2023-12-05 LAB — TESTOSTERONE: Testosterone: 294.82 ng/dL — ABNORMAL LOW (ref 300.00–890.00)

## 2023-12-05 LAB — LIPID PANEL
Cholesterol: 225 mg/dL — ABNORMAL HIGH (ref 0–200)
HDL: 62.1 mg/dL (ref >39.00)
LDL Cholesterol: 153 mg/dL — ABNORMAL HIGH (ref 0–99)
NonHDL: 162.63
Total CHOL/HDL Ratio: 4
Triglycerides: 49 mg/dL (ref 0.0–149.0)
VLDL: 9.8 mg/dL (ref 0.0–40.0)

## 2023-12-05 LAB — CBC
HCT: 38.7 % — ABNORMAL LOW (ref 39.0–52.0)
Hemoglobin: 13.4 g/dL (ref 13.0–17.0)
MCHC: 34.7 g/dL (ref 30.0–36.0)
MCV: 89.5 fl (ref 78.0–100.0)
Platelets: 466 K/uL — ABNORMAL HIGH (ref 150.0–400.0)
RBC: 4.33 Mil/uL (ref 4.22–5.81)
RDW: 13.9 % (ref 11.5–15.5)
WBC: 8.1 K/uL (ref 4.0–10.5)

## 2023-12-05 LAB — HEMOGLOBIN A1C: Hgb A1c MFr Bld: 5.5 % (ref 4.6–6.5)

## 2023-12-08 DIAGNOSIS — Z760 Encounter for issue of repeat prescription: Secondary | ICD-10-CM | POA: Diagnosis not present

## 2023-12-08 DIAGNOSIS — M150 Primary generalized (osteo)arthritis: Secondary | ICD-10-CM | POA: Diagnosis not present

## 2023-12-10 ENCOUNTER — Ambulatory Visit: Admitting: Physical Therapy

## 2023-12-10 ENCOUNTER — Ambulatory Visit: Payer: Self-pay | Admitting: Student in an Organized Health Care Education/Training Program

## 2023-12-10 ENCOUNTER — Encounter: Payer: Self-pay | Admitting: Physical Therapy

## 2023-12-10 DIAGNOSIS — M25511 Pain in right shoulder: Secondary | ICD-10-CM | POA: Diagnosis not present

## 2023-12-10 DIAGNOSIS — R293 Abnormal posture: Secondary | ICD-10-CM | POA: Diagnosis not present

## 2023-12-10 DIAGNOSIS — G8929 Other chronic pain: Secondary | ICD-10-CM | POA: Diagnosis not present

## 2023-12-10 DIAGNOSIS — M25611 Stiffness of right shoulder, not elsewhere classified: Secondary | ICD-10-CM | POA: Insufficient documentation

## 2023-12-10 DIAGNOSIS — M6281 Muscle weakness (generalized): Secondary | ICD-10-CM | POA: Diagnosis not present

## 2023-12-10 NOTE — Therapy (Signed)
 OUTPATIENT PHYSICAL THERAPY UPPER EXTREMITY TREATMENT   Patient Name: Alex Logan MRN: [id number] DOB:02/27/1965, 58 y.o., male Today's Date: 12/10/2023  END OF SESSION:  PT End of Session - 12/10/23 1450     Visit Number 11    Date for Recertification  12/28/23    Authorization Type [employer] Approved 16 vl 10/29/23-12/28/23-auth# [id number] (27 VL)    Authorization Time Period 10/29/23-12/28/23    Authorization - Visit Number 10    Authorization - Number of Visits 16    PT Start Time 1405    PT Stop Time 1446    PT Time Calculation (min) 41 min    Activity Tolerance Patient tolerated treatment well    Behavior During Therapy WFL for tasks assessed/performed                   Past Medical History:  Diagnosis Date   Alcohol abuse    Anxiety    Depression    GERD (gastroesophageal reflux disease)    Hypertension    Past Surgical History:  Procedure Laterality Date   NO PAST SURGERIES     SHOULDER SURGERY Right 10/15/2023   Patient Active Problem List   Diagnosis Date Noted   Gastroesophageal reflux disease 12/04/2023   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   Prediabetes 12/04/2023   Cerumen debris on tympanic membrane, bilateral 12/04/2023   Osteoarthritis of left knee 11/01/2023   Depression with anxiety 05/27/2017   Hypertension 05/23/2017   Alcohol use disorder, severe, in sustained remission (HCC) 09/27/2016    PCP: None  REFERRING PROVIDER:    Brendan Gauze, PA-C    REFERRING DIAG: [phone] status post shoulder surgery   THERAPY DIAG:  Chronic right shoulder pain  Stiffness of right shoulder, not elsewhere classified  Abnormal posture  Muscle weakness (generalized)  Rationale for Evaluation and Treatment: Rehabilitation  ONSET DATE: Surgery Date Oct 3 , 2025  SUBJECTIVE:                                                                                                                                                                                       SUBJECTIVE STATEMENT: Patient reports he is doing okay today. He did not have any soreness after treatment session.  Eval: Patient presents status post right shoulder coracoid transfer on Oct 12, 2023. He is supposed to be in the sling until Oct 31st, but he does not have it on today. He had a follow up appointment 10/26/2023 and everything is healing well. He dislocated it shoulder 5 years ago from work accident, and the injury just kept getting worse.  Hand dominance: Right  PERTINENT HISTORY: HTN; Anxiety; Depression  PAIN:  Are you having pain? Yes: NPRS scale: only shoulder discomfort with elevation but it is mild Pain location: coracoid process  Pain description: discomfort Aggravating factors: reaching Relieving factors: Tylenol    PRECAUTIONS: Shoulder see protocol in media  RED FLAGS: None   WEIGHT BEARING RESTRICTIONS: No  FALLS:  Has patient fallen in last 6 months? No  LIVING ENVIRONMENT: Lives with: lives alone Lives in: House/apartment Stairs: Yes: Internal: 12 steps; on right going up   OCCUPATION: Not currently working- Works at [employer] (standing, bending, lifting )  PLOF: Independent, Independent with basic ADLs, Independent with household mobility without device, Independent with community mobility without device, Independent with gait, Independent with transfers, and Leisure: Walk  PATIENT GOALS: To get back to normal activities  NEXT MD VISIT: Week of Nov 10th   OBJECTIVE:  Note: Objective measures were completed at Evaluation unless otherwise noted.  DIAGNOSTIC FINDINGS:  04/12/2023 FINDINGS: Anterior-inferior dislocation of the right humeral head relative to the glenoid. No acute displaced fracture. There is no evidence of arthropathy or other focal bone abnormality. Soft tissues are unremarkable. Old fracture of the right posterior fifth rib.   IMPRESSION: Anterior right shoulder dislocation.  PATIENT SURVEYS :   Eval:  Quick Dash: 31.8/100 31.8%  11/20/2023:  Quick DASH 15.91%  Minimally Clinically Important Difference (MCID): 15-20 points   COGNITION: Overall cognitive status: Within functional limits for tasks assessed     SENSATION: WFL  POSTURE: Rounded shoulders & forward head  UPPER EXTREMITY ROM:   Eval: PROM : shoulder flexion in scapular plane to 90 degrees (no pain or restriction), Mild discomfort with PROM to ER to neutral  11/20/2023: Right shoulder flexion AA/ROM:  115 degrees    12/03/2023 Right A/ROM: Flexion 140 degrees Abduction 135 degrees IR:T8 ER: T2  UPPER EXTREMITY MMT:   MMT Right eval Left eval  Shoulder flexion    Shoulder extension    Shoulder abduction    Shoulder adduction    Shoulder internal rotation    Shoulder external rotation    Middle trapezius    Lower trapezius    Elbow flexion    Elbow extension    Wrist flexion    Wrist extension    Wrist ulnar deviation    Wrist radial deviation    Wrist pronation    Wrist supination    Grip strength (lbs) 90 82  (Blank rows = not tested)   PALPATION:  Increased muscle spasms right biceps Increased muscle guarding to right shoulder PROM                                                                                                                             TREATMENT DATE:  12/10/2023: UBE 3/3 level 2.5 - PT present to discuss status 4D scap stabilization with yellow plyo ball  3way scap stabilization with blue loop x 8 each direction bilateral  Lat pull down 35# 2 x 10 Seated matrix row 30#  2 x 10 Seated shoulder flexion & scaption 3# 2 x 10 each direction Standing D 2 flexion with green TB 2 x 10 Rt  Standing ER with green TB x 20 Counter push up 2 x 5 5# Bottoms up KB press 2 x 5 Prone shoulder row, extension , abduction  x 12 Rt 2# DB today    12/03/2023: UBE 3/3 level 2.5 - PT present to discuss status 3way scap stabilization with blue loop x 8 each direction  bilateral  Iso shoulder abduction against blue loop + shoulder flexion 2 x 8 Counter push up 2 x 5 Lat pull down 30# 2 x 10 Seated shoulder flexion & scaption 2# x 10 each direction Shoulder row & extension with green TB 2 x 10 ROM assessment look above Prone shoulder row, extension , abduction 2 x 12 Rt  SL ER 0# 2 x 10 Rt  Seated shoulder ER with green TB 2 x 10 Seated shoulder abduction with green TB 2 x 10 Biceps/ pec strech in doorway (60 degrees arm straight) 3 x 20 sec Rt     11/29/2023: UBE 3/3 level 2.5 - PT present to discuss status Shoulder IR with pulley x 2 min Rt  4D scap stabilization with ball blue ball x 20 each direction (up/down;side/side/circles) 3way scap stabilization with blue loop 2 x 5 each direction bilateral  Plank on wall + shoulder tap  x 10 (next time try the counter) Standing shoulder ER with red TB 2 x 10 Rt  Standing shoulder IR with red TB 2 x 10 Rt  Supine serratus punch 3# DB 2 x 10 Supine shoulder alphabet 3# x 1 SL ER 0# 2 x 10 Rt  Prone shoulder row, extension , abduction x 12 Rt  Biceps/ pec strech in doorway (60 degrees arm straight) 3 x 20 sec Rt     11/26/2023: UBE 2/2 - PT present to discuss status 3way scap stabilization with light green loop x 5 each direction bilateral  4D scap stabilization with ball blue ball x 20 each direction (up/down;side/side/circles) Shoulder IR with pulley x 2 min Rt  Shoulder row with green TB 2 x 10 Shoulder extension with green 2 x 10 Standing shoulder ER with red TB 2 x 10 Standing shoulder IR with red TB 2 x 10 Supine shoulder flexion stretch with dowel an 3# AW x 10 3-4sec hold Supine serratus punch 2# DB 2 x 10 Supine shoulder alphabet 2# x 1 SL ER 0# 2 x 10 Rt      PATIENT EDUCATION: Education details: PT eval findings, anticipated POC, progress with PT, and initial HEP Person educated: Patient Education method: Explanation, Demonstration, and Handouts Education comprehension:  verbalized understanding, returned demonstration, and needs further education  HOME EXERCISE PROGRAM: Access Code: [email or web address] URL: [email or web address] Date: 11/26/2023 Prepared by: Kristeen Sar  Exercises - Seated Scapular Retraction  - 1 x daily - 7 x weekly - 1 sets - 10 reps - Seated Elbow Flexion and Extension AROM  - 1 x daily - 7 x weekly - 1 sets - 10 reps - Wrist Extension AROM  - 1 x daily - 7 x weekly - 1 sets - 10 reps - Isometric Shoulder Abduction at Wall  - 1 x daily - 7 x weekly - 1-2 sets - 5 reps - 5s hold - Seated Shoulder External Rotation AAROM with Cane and Hand in Neutral  - 2 x daily - 7 x weekly - 2 sets - 10 reps -  Supine Shoulder Flexion Extension AAROM with Dowel  - 2 x daily - 7 x weekly - 2 sets - 10 reps - Scapular Stability on Wall With Resistance Band (Hemiplegia)   - 1 x daily - 7 x weekly - 1 sets - 5 reps  ASSESSMENT:  CLINICAL IMPRESSION: Benjimin is progressing well per protocol. He tolerated progressions of periscapular strengthening well. Patient verbalized feeling like he is doing too much activity at work provided patient education on modifications he can do. He understood that healing is still taking place in his shoulder. PT monitored patient response throughout session and provided verbal and visual cues as needed.  Patient demonstrates good rehab potential to achieve stated goals through skilled therapy intervention.       Eval: Patient is a 58 y.o. male who was seen today for physical therapy evaluation and treatment for right shoulder stiffness status post right shoulder coracoid transfer. Betzalel presents with a history of a right shoulder dislocation that happened at work 5 years ago. Over the years his injury got worse and it restricted his function. His surgery date was Oct 3rd and based on protocol he to wear the sling until Oct 31st. Patient did not have on the sling today and educated him on the importance of wearing until  next week. Noted increased muscle guarding with right shoulder PROM, but motions were not painful and no increased restrictions were noted. Patient is not currently working, but his job duties required bending, lifting and stooping. Patient is highly motivated and wants to get back to his normal function. Patient will benefit from skilled PT to address the below impairments and improve overall function.    OBJECTIVE IMPAIRMENTS: decreased ROM, decreased strength, hypomobility, increased muscle spasms, impaired flexibility, impaired UE functional use, and pain.   ACTIVITY LIMITATIONS: carrying, lifting, bending, sleeping, dressing, reach over head, and hygiene/grooming  PARTICIPATION LIMITATIONS: meal prep, cleaning, laundry, community activity, and occupation  PERSONAL FACTORS: Fitness and 3+ comorbidities: HTN; Anxiety; Depression are also affecting patient's functional outcome.   REHAB POTENTIAL: Good  CLINICAL DECISION MAKING: Evolving/moderate complexity  EVALUATION COMPLEXITY: Low  GOALS: Goals reviewed with patient? Yes  SHORT TERM GOALS: Target date: 11/26/2023  Patient will be independent with initial HEP. Baseline:  Goal status: MET 11/08/2023  2.  .  Patient will demonstrate > or = to 110 degrees in Rt shoulder P/ROM for improved reaching. Baseline:  Goal status: Met on 11/20/23   3.  Patient will report > or = to 30% use of Rt UE with light home and self care tasks due to improved ROM and strength. Baseline:  Goal status: MET (reports 80% improvement on 11/20/23)    LONG TERM GOALS: Target date: 12/28/2023  Patient will demonstrate independence in advanced HEP. Baseline:  Goal status: Ongoing  2.  Patient will report > or = to 70% of normal use of Rt UE for ADLs due to improved functional strength and A/ROM. Baseline:  Goal status: MET 80% 12/03/2023  3. .  Patient will demonstrate > or = to 115 degrees in Rt shoulder A/ROM for improved reaching  overhead. Baseline:  Goal status: Ongoing   4.  Patient will demonstrate correct bending and lifting technique for safe return to work with no limitations.  Baseline:  Goal status: INITIAL  5.  Patient will score no greater than 12% on Quick DASH due to improved right shoulder function. Baseline: 31.8% Goal status: IN PROGRESS (adjusted goal on 11/20/23)   PLAN: PT FREQUENCY: 2x/week  PT DURATION: 8 weeks  PLANNED INTERVENTIONS: 97164- PT Re-evaluation, 97110-Therapeutic exercises, 97530- Therapeutic activity, 97112- Neuromuscular re-education, 97535- Self Care, 02859- Manual therapy, [phone]- Canalith repositioning, [id number]- Aquatic Therapy, [phone]- Electrical stimulation (unattended), [phone]- Electrical stimulation (manual), [id number]- Vasopneumatic device, [id number]- Ultrasound, [id number]- Traction (mechanical), [id number]- Ionotophoresis 4mg /ml Dexamethasone, 20560 (1-2 muscles), 20561 (3+ muscles)- Dry Needling, Patient/Family education, Balance training, Stair training, Taping, Joint mobilization, Joint manipulation, Spinal manipulation, Spinal mobilization, Scar mobilization, Vestibular training, Cryotherapy, and Moist heat  PLAN FOR NEXT SESSION: continue shoulder + periscapular stability exercises; follow protocol in media/PT referral; right shoulder PROM      Kristeen Sar, PT, DPT 12/10/23 2:51 PM [facility] [address] [place], [place] [address] Phone # [phone] Fax [phone]

## 2023-12-13 ENCOUNTER — Ambulatory Visit: Admitting: Physical Therapy

## 2023-12-13 ENCOUNTER — Telehealth: Payer: Self-pay | Admitting: Physical Therapy

## 2023-12-13 DIAGNOSIS — F411 Generalized anxiety disorder: Secondary | ICD-10-CM | POA: Diagnosis not present

## 2023-12-13 DIAGNOSIS — F331 Major depressive disorder, recurrent, moderate: Secondary | ICD-10-CM | POA: Diagnosis not present

## 2023-12-13 NOTE — Telephone Encounter (Signed)
 Called patient about missed appointment. He got his times mixed up. He confirmed next scheduled appointment.  Kristeen Sar, PT, DPT 12/13/23 2:39 PM

## 2023-12-14 ENCOUNTER — Encounter: Payer: Self-pay | Admitting: Student in an Organized Health Care Education/Training Program

## 2023-12-14 ENCOUNTER — Ambulatory Visit: Admitting: Student in an Organized Health Care Education/Training Program

## 2023-12-14 VITALS — BP 132/97 | HR 80 | Wt 169.0 lb

## 2023-12-14 DIAGNOSIS — E291 Testicular hypofunction: Secondary | ICD-10-CM

## 2023-12-14 DIAGNOSIS — B079 Viral wart, unspecified: Secondary | ICD-10-CM | POA: Insufficient documentation

## 2023-12-14 NOTE — Assessment & Plan Note (Signed)
 Exam is consistent with a small verruca on the distal tip of the left third digit.  Has been present for many years.  We talked about options, decided to treat it with cryotherapy.  We did 2 freeze thaw cycles.  Given instructions about local wound care.  Procedure Note:  Verbal consent was obtained.  Liquid nitrogen was applied for 10-12 seconds to the skin lesions and the expected blistering or scabbing reaction explained. Do not pick at the areas. Patient reminded to expect hypopigmented scars from the procedure. Return if lesions fail to fully resolve.  Patient tolerated the procedure well.

## 2023-12-14 NOTE — Patient Instructions (Signed)
  VISIT SUMMARY: Today, you were seen for a bleeding skin lesion and concerns about skin thinning. You have had the lesion for about a year, and it started bleeding again after returning to work. You also experience thin skin, which leads to frequent injuries. You have a history of warts and described the current lesion as similar to previous warts.  YOUR PLAN: -VIRAL WART: A viral wart is a small growth on the skin caused by a virus. We performed cryotherapy with liquid nitrogen on the wart on your finger. This treatment has about a 70% success rate but can be challenging due to the depth of the wart. You may experience blistering and a burn-like wound, but it should heal in a few weeks.  INSTRUCTIONS: Please monitor the treated area for any signs of infection, such as increased redness, swelling, or pus. If you notice any of these symptoms or if the area does not heal as expected, please contact our office. Follow up in 2-3 weeks to assess the healing process and determine if further treatment is needed.

## 2023-12-14 NOTE — Progress Notes (Signed)
   Acute Office Visit  Patient ID: Alex Alex, male    DOB: [birth year], 58 y.o.   MRN: [id number]  PCP: Jerrell Cleatus Ned, MD  Chief Complaint  Patient presents with   Hand Pain    Spot on index finger on right hand that will not heal     Subjective:     HPI  Discussed the use of AI scribe software for clinical note transcription with the patient, who gave verbal consent to proceed.  History of Present Illness Alex Alex is a 58 year old male who presents with a bleeding skin lesion and concerns about skin thinning.  He has had a skin lesion for about a year, which started bleeding again after returning to work. The lesion is described as a defined circle, and he denies picking at it. He has been applying lotion to his hands but is unsure if it is related to dermatological issues.  He experiences thin skin, leading to frequent injuries that require band-aids. He attributes this to aging and notes that his skin is particularly thin in certain areas, making it more susceptible to injury.  He has a history of warts and describes the current lesion on his finger as similar to previous warts. He has previously undergone treatments for warts, including freezing and medication, which can be difficult to treat due to their depth in the skin.  He mentions a past experience with melphalan treatment for a different condition, noting that it required oral medication and took a significant amount of time for the affected nail to grow out.     Objective:    BP (!) 132/97   Pulse 80   Wt 169 lb (76.7 kg)   SpO2 99%   BMI 25.32 kg/m   Physical Exam  Gen: Well-appearing man Skin: On the tip of the left third finger he has a 2 mm verruca with some tenderness deep to it.  There is some overlying scabbing from where the verruca has been scratched.  No surrounding erythema.  No drainage.     Assessment & Plan:   Problem List Items Addressed This Visit       Unprioritized    Viral wart on finger - Primary   Exam is consistent with a small verruca on the distal tip of the left third digit.  Has been present for many years.  We talked about options, decided to treat it with cryotherapy.  We did 2 freeze thaw cycles.  Given instructions about local wound care.  Procedure Note:  Verbal consent was obtained.  Liquid nitrogen was applied for 10-12 seconds to the skin lesions and the expected blistering or scabbing reaction explained. Do not pick at the areas. Patient reminded to expect hypopigmented scars from the procedure. Return if lesions fail to fully resolve.  Patient tolerated the procedure well.        Cleatus Ned Jerrell, MD [facility]

## 2023-12-17 ENCOUNTER — Ambulatory Visit: Admitting: Physical Therapy

## 2023-12-17 DIAGNOSIS — G8929 Other chronic pain: Secondary | ICD-10-CM

## 2023-12-17 DIAGNOSIS — M6281 Muscle weakness (generalized): Secondary | ICD-10-CM

## 2023-12-17 DIAGNOSIS — R293 Abnormal posture: Secondary | ICD-10-CM

## 2023-12-17 NOTE — Therapy (Signed)
 OUTPATIENT PHYSICAL THERAPY UPPER EXTREMITY TREATMENT   Patient Name: Alex Logan MRN: [id number] DOB:12/19/1965, 58 y.o., male Today's Date: 12/17/2023  END OF SESSION:  PT End of Session - 12/17/23 1012     Visit Number 12    Date for Recertification  12/28/23    Authorization Type [employer] Approved 16 vl 10/29/23-12/28/23-auth# [id number] (27 VL)    Authorization Time Period 10/29/23-12/28/23    Authorization - Visit Number 11    Authorization - Number of Visits 16    PT Start Time 0930    PT Stop Time 1013    PT Time Calculation (min) 43 min    Activity Tolerance Patient tolerated treatment well    Behavior During Therapy WFL for tasks assessed/performed                    Past Medical History:  Diagnosis Date   Alcohol abuse    Anxiety    Depression    GERD (gastroesophageal reflux disease)    Hypertension    Past Surgical History:  Procedure Laterality Date   NO PAST SURGERIES     SHOULDER SURGERY Right 10/15/2023   Patient Active Problem List   Diagnosis Date Noted   Viral wart on finger 12/14/2023   Gastroesophageal reflux disease 12/04/2023   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   Prediabetes 12/04/2023   Cerumen debris on tympanic membrane, bilateral 12/04/2023   Osteoarthritis of left knee 11/01/2023   Depression with anxiety 05/27/2017   Hypertension 05/23/2017   Alcohol use disorder, severe, in sustained remission (HCC) 09/27/2016    PCP: None  REFERRING PROVIDER:    Brendan Gauze, PA-C    REFERRING DIAG: [phone] status post shoulder surgery   THERAPY DIAG:  Abnormal posture  Muscle weakness (generalized)  Chronic right shoulder pain  Rationale for Evaluation and Treatment: Rehabilitation  ONSET DATE: Surgery Date Oct 3 , 2025  SUBJECTIVE:                                                                                                                                                                                       SUBJECTIVE STATEMENT: Patient reports he is doing okay today. He did not have any soreness after treatment session.  Eval: Patient presents status post right shoulder coracoid transfer on Oct 12, 2023. He is supposed to be in the sling until Oct 31st, but he does not have it on today. He had a follow up appointment 10/26/2023 and everything is healing well. He dislocated it shoulder 5 years ago from work accident, and the injury just kept getting worse.  Hand dominance: Right  PERTINENT HISTORY: HTN; Anxiety; Depression  PAIN:  Are you having pain? Yes: NPRS scale: only shoulder discomfort with elevation but it is mild Pain location: coracoid process  Pain description: discomfort Aggravating factors: reaching Relieving factors: Tylenol    PRECAUTIONS: Shoulder see protocol in media  RED FLAGS: None   WEIGHT BEARING RESTRICTIONS: No  FALLS:  Has patient fallen in last 6 months? No  LIVING ENVIRONMENT: Lives with: lives alone Lives in: House/apartment Stairs: Yes: Internal: 12 steps; on right going up   OCCUPATION: Not currently working- Works at [employer] (standing, bending, lifting )  PLOF: Independent, Independent with basic ADLs, Independent with household mobility without device, Independent with community mobility without device, Independent with gait, Independent with transfers, and Leisure: Walk  PATIENT GOALS: To get back to normal activities  NEXT MD VISIT: Week of Nov 10th   OBJECTIVE:  Note: Objective measures were completed at Evaluation unless otherwise noted.  DIAGNOSTIC FINDINGS:  04/12/2023 FINDINGS: Anterior-inferior dislocation of the right humeral head relative to the glenoid. No acute displaced fracture. There is no evidence of arthropathy or other focal bone abnormality. Soft tissues are unremarkable. Old fracture of the right posterior fifth rib.   IMPRESSION: Anterior right shoulder dislocation.  PATIENT SURVEYS :  Eval:  Quick Dash:  31.8/100 31.8%  11/20/2023:  Quick DASH 15.91%  Minimally Clinically Important Difference (MCID): 15-20 points   COGNITION: Overall cognitive status: Within functional limits for tasks assessed     SENSATION: WFL  POSTURE: Rounded shoulders & forward head  UPPER EXTREMITY ROM:   Eval: PROM : shoulder flexion in scapular plane to 90 degrees (no pain or restriction), Mild discomfort with PROM to ER to neutral  11/20/2023: Right shoulder flexion AA/ROM:  115 degrees    12/03/2023 Right A/ROM: Flexion 140 degrees Abduction 135 degrees IR:T8 ER: T2  UPPER EXTREMITY MMT:   MMT Right eval Left eval  Shoulder flexion    Shoulder extension    Shoulder abduction    Shoulder adduction    Shoulder internal rotation    Shoulder external rotation    Middle trapezius    Lower trapezius    Elbow flexion    Elbow extension    Wrist flexion    Wrist extension    Wrist ulnar deviation    Wrist radial deviation    Wrist pronation    Wrist supination    Grip strength (lbs) 90 82  (Blank rows = not tested)   PALPATION:  Increased muscle spasms right biceps Increased muscle guarding to right shoulder PROM                                                                                                                             TREATMENT DATE:   12/17/23: UBE 3 min forward/backward 2.5 - PT present to discuss status 4D scap stabilization with yellow plyo ball  3way scap stabilization with yellow loop x 8 each direction bilateral  Lat pull down 35# 2 x 10 Seated shoulder  flexion & scaption 3# 2 x 10 each direction Standing D 2 flexion with green TB 2 x 10 Rt  Standing ER with green TB x 20 Counter push up 2 x 5 Prone shoulder row, extension , abduction  2x10 Rt 2# DB   12/10/2023: UBE 3/3 level 2.5 - PT present to discuss status 4D scap stabilization with yellow plyo ball  3way scap stabilization with blue loop x 8 each direction bilateral  Lat pull down 35# 2 x  10 Seated matrix row 30# 2 x 10 Seated shoulder flexion & scaption 3# 2 x 10 each direction Standing D 2 flexion with green TB 2 x 10 Rt  Standing ER with green TB x 20 Counter push up 2 x 5 5# Bottoms up KB press 2 x 5 Prone shoulder row, extension , abduction  x 12 Rt 2# DB today   12/03/2023: UBE 3/3 level 2.5 - PT present to discuss status 3way scap stabilization with blue loop x 8 each direction bilateral  Iso shoulder abduction against blue loop + shoulder flexion 2 x 8 Counter push up 2 x 5 Lat pull down 30# 2 x 10 Seated shoulder flexion & scaption 2# x 10 each direction Shoulder row & extension with green TB 2 x 10 ROM assessment look above Prone shoulder row, extension , abduction 2 x 12 Rt  SL ER 0# 2 x 10 Rt  Seated shoulder ER with green TB 2 x 10 Seated shoulder abduction with green TB 2 x 10 Biceps/ pec strech in doorway (60 degrees arm straight) 3 x 20 sec Rt     11/29/2023: UBE 3/3 level 2.5 - PT present to discuss status Shoulder IR with pulley x 2 min Rt  4D scap stabilization with ball blue ball x 20 each direction (up/down;side/side/circles) 3way scap stabilization with blue loop 2 x 5 each direction bilateral  Plank on wall + shoulder tap  x 10 (next time try the counter) Standing shoulder ER with red TB 2 x 10 Rt  Standing shoulder IR with red TB 2 x 10 Rt  Supine serratus punch 3# DB 2 x 10 Supine shoulder alphabet 3# x 1 SL ER 0# 2 x 10 Rt  Prone shoulder row, extension , abduction x 12 Rt  Biceps/ pec strech in doorway (60 degrees arm straight) 3 x 20 sec Rt     11/26/2023: UBE 2/2 - PT present to discuss status 3way scap stabilization with light green loop x 5 each direction bilateral  4D scap stabilization with ball blue ball x 20 each direction (up/down;side/side/circles) Shoulder IR with pulley x 2 min Rt  Shoulder row with green TB 2 x 10 Shoulder extension with green 2 x 10 Standing shoulder ER with red TB 2 x 10 Standing shoulder IR  with red TB 2 x 10 Supine shoulder flexion stretch with dowel an 3# AW x 10 3-4sec hold Supine serratus punch 2# DB 2 x 10 Supine shoulder alphabet 2# x 1 SL ER 0# 2 x 10 Rt      PATIENT EDUCATION: Education details: PT eval findings, anticipated POC, progress with PT, and initial HEP Person educated: Patient Education method: Explanation, Demonstration, and Handouts Education comprehension: verbalized understanding, returned demonstration, and needs further education  HOME EXERCISE PROGRAM: Access Code: [email or web address] URL: [email or web address] Date: 11/26/2023 Prepared by: Kristeen Sar  Exercises - Seated Scapular Retraction  - 1 x daily - 7 x weekly - 1 sets - 10 reps - Seated Elbow Flexion  and Extension AROM  - 1 x daily - 7 x weekly - 1 sets - 10 reps - Wrist Extension AROM  - 1 x daily - 7 x weekly - 1 sets - 10 reps - Isometric Shoulder Abduction at Wall  - 1 x daily - 7 x weekly - 1-2 sets - 5 reps - 5s hold - Seated Shoulder External Rotation AAROM with Cane and Hand in Neutral  - 2 x daily - 7 x weekly - 2 sets - 10 reps - Supine Shoulder Flexion Extension AAROM with Dowel  - 2 x daily - 7 x weekly - 2 sets - 10 reps - Scapular Stability on Wall With Resistance Band (Hemiplegia)   - 1 x daily - 7 x weekly - 1 sets - 5 reps  ASSESSMENT:  CLINICAL IMPRESSION: Alex Logan is progressing well per protocol. Pt tolerated session well, denied pain throughout and after. PT monitored patient response throughout session and provided verbal and visual cues as needed.  Patient demonstrates good rehab potential to achieve stated goals through skilled therapy intervention.       Eval: Patient is a 58 y.o. male who was seen today for physical therapy evaluation and treatment for right shoulder stiffness status post right shoulder coracoid transfer. Alex Logan presents with a history of a right shoulder dislocation that happened at work 5 years ago. Over the years his injury got  worse and it restricted his function. His surgery date was Oct 3rd and based on protocol he to wear the sling until Oct 31st. Patient did not have on the sling today and educated him on the importance of wearing until next week. Noted increased muscle guarding with right shoulder PROM, but motions were not painful and no increased restrictions were noted. Patient is not currently working, but his job duties required bending, lifting and stooping. Patient is highly motivated and wants to get back to his normal function. Patient will benefit from skilled PT to address the below impairments and improve overall function.    OBJECTIVE IMPAIRMENTS: decreased ROM, decreased strength, hypomobility, increased muscle spasms, impaired flexibility, impaired UE functional use, and pain.   ACTIVITY LIMITATIONS: carrying, lifting, bending, sleeping, dressing, reach over head, and hygiene/grooming  PARTICIPATION LIMITATIONS: meal prep, cleaning, laundry, community activity, and occupation  PERSONAL FACTORS: Fitness and 3+ comorbidities: HTN; Anxiety; Depression are also affecting patient's functional outcome.   REHAB POTENTIAL: Good  CLINICAL DECISION MAKING: Evolving/moderate complexity  EVALUATION COMPLEXITY: Low  GOALS: Goals reviewed with patient? Yes  SHORT TERM GOALS: Target date: 11/26/2023  Patient will be independent with initial HEP. Baseline:  Goal status: MET 11/08/2023  2.  .  Patient will demonstrate > or = to 110 degrees in Rt shoulder P/ROM for improved reaching. Baseline:  Goal status: Met on 11/20/23   3.  Patient will report > or = to 30% use of Rt UE with light home and self care tasks due to improved ROM and strength. Baseline:  Goal status: MET (reports 80% improvement on 11/20/23)    LONG TERM GOALS: Target date: 12/28/2023  Patient will demonstrate independence in advanced HEP. Baseline:  Goal status: Ongoing  2.  Patient will report > or = to 70% of normal use of  Rt UE for ADLs due to improved functional strength and A/ROM. Baseline:  Goal status: MET 80% 12/03/2023  3. .  Patient will demonstrate > or = to 115 degrees in Rt shoulder A/ROM for improved reaching overhead. Baseline:  Goal status: Ongoing  4.  Patient will demonstrate correct bending and lifting technique for safe return to work with no limitations.  Baseline:  Goal status: INITIAL  5.  Patient will score no greater than 12% on Quick DASH due to improved right shoulder function. Baseline: 31.8% Goal status: IN PROGRESS (adjusted goal on 11/20/23)   PLAN: PT FREQUENCY: 2x/week  PT DURATION: 8 weeks  PLANNED INTERVENTIONS: 97164- PT Re-evaluation, 97110-Therapeutic exercises, 97530- Therapeutic activity, 97112- Neuromuscular re-education, 97535- Self Care, 02859- Manual therapy, [phone]- Canalith repositioning, [id number]- Aquatic Therapy, [id number]- Electrical stimulation (unattended), [phone]- Electrical stimulation (manual), [id number]- Vasopneumatic device, [id number]- Ultrasound, [id number]- Traction (mechanical), [id number]- Ionotophoresis 4mg /ml Dexamethasone, 79439 (1-2 muscles), 20561 (3+ muscles)- Dry Needling, Patient/Family education, Balance training, Stair training, Taping, Joint mobilization, Joint manipulation, Spinal manipulation, Spinal mobilization, Scar mobilization, Vestibular training, Cryotherapy, and Moist heat  PLAN FOR NEXT SESSION: continue shoulder + periscapular stability exercises; follow protocol in media/PT referral; right shoulder PROM      Darryle Navy, PT, DPT 12/16/2508:13 AM  [facility] [address], [address] [place], [place] [address] Phone # [phone] Fax [phone]

## 2023-12-18 NOTE — Telephone Encounter (Signed)
 Referral, records, patient's demographics, and insurance information faxed to [facility] at [phone].  P: [phone]

## 2023-12-20 ENCOUNTER — Ambulatory Visit: Admitting: Physical Therapy

## 2023-12-21 ENCOUNTER — Encounter: Payer: Self-pay | Admitting: Physical Therapy

## 2023-12-21 ENCOUNTER — Ambulatory Visit: Admitting: Physical Therapy

## 2023-12-21 DIAGNOSIS — M25611 Stiffness of right shoulder, not elsewhere classified: Secondary | ICD-10-CM | POA: Diagnosis not present

## 2023-12-21 DIAGNOSIS — Z419 Encounter for procedure for purposes other than remedying health state, unspecified: Secondary | ICD-10-CM | POA: Diagnosis not present

## 2023-12-21 DIAGNOSIS — M6281 Muscle weakness (generalized): Secondary | ICD-10-CM

## 2023-12-21 DIAGNOSIS — R293 Abnormal posture: Secondary | ICD-10-CM

## 2023-12-21 DIAGNOSIS — G8929 Other chronic pain: Secondary | ICD-10-CM

## 2023-12-21 DIAGNOSIS — M25511 Pain in right shoulder: Secondary | ICD-10-CM | POA: Diagnosis not present

## 2023-12-21 NOTE — Therapy (Signed)
 OUTPATIENT PHYSICAL THERAPY UPPER EXTREMITY TREATMENT   Patient Name: Alex Logan MRN: [id number] DOB:14-May-1965, 58 y.o., male Today's Date: 12/21/2023  END OF SESSION:  PT End of Session - 12/21/23 1016     Visit Number 13    Date for Recertification  12/28/23    Authorization Type [employer] Approved 16 vl 10/29/23-12/28/23-auth# [id number] (27 VL)    Authorization Time Period 10/29/23-12/28/23    Authorization - Visit Number 12    Authorization - Number of Visits 16    PT Start Time 0934    PT Stop Time 1011    PT Time Calculation (min) 37 min    Activity Tolerance Patient tolerated treatment well    Behavior During Therapy WFL for tasks assessed/performed                     Past Medical History:  Diagnosis Date   Alcohol abuse    Anxiety    Depression    GERD (gastroesophageal reflux disease)    Hypertension    Past Surgical History:  Procedure Laterality Date   NO PAST SURGERIES     SHOULDER SURGERY Right 10/15/2023   Patient Active Problem List   Diagnosis Date Noted   Viral wart on finger 12/14/2023   Gastroesophageal reflux disease 12/04/2023   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   Prediabetes 12/04/2023   Cerumen debris on tympanic membrane, bilateral 12/04/2023   Osteoarthritis of left knee 11/01/2023   Depression with anxiety 05/27/2017   Hypertension 05/23/2017   Alcohol use disorder, severe, in sustained remission (HCC) 09/27/2016    PCP: None  REFERRING PROVIDER:    Brendan Gauze, PA-C    REFERRING DIAG: [phone] status post shoulder surgery   THERAPY DIAG:  Abnormal posture  Muscle weakness (generalized)  Chronic right shoulder pain  Stiffness of right shoulder, not elsewhere classified  Rationale for Evaluation and Treatment: Rehabilitation  ONSET DATE: Surgery Date Oct 3 , 2025  SUBJECTIVE:                                                                                                                                                                                       SUBJECTIVE STATEMENT: Patient reports he is doing good today. He has some discomfort in his right biceps occasionally.   Eval: Patient presents status post right shoulder coracoid transfer on Oct 12, 2023. He is supposed to be in the sling until Oct 31st, but he does not have it on today. He had a follow up appointment 10/26/2023 and everything is healing well. He dislocated it shoulder 5 years ago from work accident, and the injury just kept getting worse.  Hand dominance: Right  PERTINENT HISTORY: HTN; Anxiety; Depression  PAIN:  Are you having pain? Yes: NPRS scale: only shoulder discomfort with elevation but it is mild Pain location: coracoid process  Pain description: discomfort Aggravating factors: reaching Relieving factors: Tylenol    PRECAUTIONS: Shoulder see protocol in media  RED FLAGS: None   WEIGHT BEARING RESTRICTIONS: No  FALLS:  Has patient fallen in last 6 months? No  LIVING ENVIRONMENT: Lives with: lives alone Lives in: House/apartment Stairs: Yes: Internal: 12 steps; on right going up   OCCUPATION: Not currently working- Works at [employer] (standing, bending, lifting )  PLOF: Independent, Independent with basic ADLs, Independent with household mobility without device, Independent with community mobility without device, Independent with gait, Independent with transfers, and Leisure: Walk  PATIENT GOALS: To get back to normal activities  NEXT MD VISIT: Week of Nov 10th   OBJECTIVE:  Note: Objective measures were completed at Evaluation unless otherwise noted.  DIAGNOSTIC FINDINGS:  04/12/2023 FINDINGS: Anterior-inferior dislocation of the right humeral head relative to the glenoid. No acute displaced fracture. There is no evidence of arthropathy or other focal bone abnormality. Soft tissues are unremarkable. Old fracture of the right posterior fifth rib.   IMPRESSION: Anterior right  shoulder dislocation.  PATIENT SURVEYS :  Eval:  Quick Dash: 31.8/100 31.8%  11/20/2023:  Quick DASH 15.91%  Minimally Clinically Important Difference (MCID): 15-20 points   COGNITION: Overall cognitive status: Within functional limits for tasks assessed     SENSATION: WFL  POSTURE: Rounded shoulders & forward head  UPPER EXTREMITY ROM:   Eval: PROM : shoulder flexion in scapular plane to 90 degrees (no pain or restriction), Mild discomfort with PROM to ER to neutral  11/20/2023: Right shoulder flexion AA/ROM:  115 degrees    12/03/2023 Right A/ROM: Flexion 140 degrees Abduction 135 degrees IR:T8 ER: T2  UPPER EXTREMITY MMT:   MMT Right eval Left eval  Shoulder flexion    Shoulder extension    Shoulder abduction    Shoulder adduction    Shoulder internal rotation    Shoulder external rotation    Middle trapezius    Lower trapezius    Elbow flexion    Elbow extension    Wrist flexion    Wrist extension    Wrist ulnar deviation    Wrist radial deviation    Wrist pronation    Wrist supination    Grip strength (lbs) 90 82  (Blank rows = not tested)   PALPATION:  Increased muscle spasms right biceps Increased muscle guarding to right shoulder PROM                                                                                                                             TREATMENT DATE:  12/21/23: UBE 3 min forward/backward 2.5 - PT present to discuss status Iso shoulder abduction + flexion with yellow loop 2 x 10 3way scap stabilization with yellow loop x 8 each  direction bilateral  Lat pull down 35# 2 x 10 Seated matrix row 30# 2 x 10 Standing D 2 flexion with green TB 2 x 10 Rt  Standing ER with green TB x 20 Standing shoulder abduction with green TB 2 x10 4# DB press x 10 Plank on knees over BOSU (side to side/ up/down) x 10 each direction Supine holding 4# DB straight up (up/down,sideto side, clockwise, counterclockwise) x 10 each  direction Supine serratus punch 4# 2 x 10 Manual: STM to right biceps Patient requested to leave early  12/17/23: UBE 3 min forward/backward 2.5 - PT present to discuss status 4D scap stabilization with yellow plyo ball  3way scap stabilization with yellow loop x 8 each direction bilateral  Lat pull down 35# 2 x 10 Seated shoulder flexion & scaption 3# 2 x 10 each direction Standing D 2 flexion with green TB 2 x 10 Rt  Standing ER with green TB x 20 Counter push up 2 x 5 Prone shoulder row, extension , abduction  2x10 Rt 2# DB   12/10/2023: UBE 3/3 level 2.5 - PT present to discuss status 4D scap stabilization with yellow plyo ball  3way scap stabilization with blue loop x 8 each direction bilateral  Lat pull down 35# 2 x 10 Seated matrix row 30# 2 x 10 Seated shoulder flexion & scaption 3# 2 x 10 each direction Standing D 2 flexion with green TB 2 x 10 Rt  Standing ER with green TB x 20 Counter push up 2 x 5 5# Bottoms up KB press 2 x 5 Prone shoulder row, extension , abduction  x 12 Rt 2# DB today   12/03/2023: UBE 3/3 level 2.5 - PT present to discuss status 3way scap stabilization with blue loop x 8 each direction bilateral  Iso shoulder abduction against blue loop + shoulder flexion 2 x 8 Counter push up 2 x 5 Lat pull down 30# 2 x 10 Seated shoulder flexion & scaption 2# x 10 each direction Shoulder row & extension with green TB 2 x 10 ROM assessment look above Prone shoulder row, extension , abduction 2 x 12 Rt  SL ER 0# 2 x 10 Rt  Seated shoulder ER with green TB 2 x 10 Seated shoulder abduction with green TB 2 x 10 Biceps/ pec strech in doorway (60 degrees arm straight) 3 x 20 sec Rt     11/29/2023: UBE 3/3 level 2.5 - PT present to discuss status Shoulder IR with pulley x 2 min Rt  4D scap stabilization with ball blue ball x 20 each direction (up/down;side/side/circles) 3way scap stabilization with blue loop 2 x 5 each direction bilateral  Plank on wall +  shoulder tap  x 10 (next time try the counter) Standing shoulder ER with red TB 2 x 10 Rt  Standing shoulder IR with red TB 2 x 10 Rt  Supine serratus punch 3# DB 2 x 10 Supine shoulder alphabet 3# x 1 SL ER 0# 2 x 10 Rt  Prone shoulder row, extension , abduction x 12 Rt  Biceps/ pec strech in doorway (60 degrees arm straight) 3 x 20 sec Rt     PATIENT EDUCATION: Education details: PT eval findings, anticipated POC, progress with PT, and initial HEP Person educated: Patient Education method: Explanation, Demonstration, and Handouts Education comprehension: verbalized understanding, returned demonstration, and needs further education  HOME EXERCISE PROGRAM: Access Code: [email or web address] URL: [email or web address] Date: 11/26/2023 Prepared by: Kristeen Sar  Exercises - Seated Scapular  Retraction  - 1 x daily - 7 x weekly - 1 sets - 10 reps - Seated Elbow Flexion and Extension AROM  - 1 x daily - 7 x weekly - 1 sets - 10 reps - Wrist Extension AROM  - 1 x daily - 7 x weekly - 1 sets - 10 reps - Isometric Shoulder Abduction at Wall  - 1 x daily - 7 x weekly - 1-2 sets - 5 reps - 5s hold - Seated Shoulder External Rotation AAROM with Cane and Hand in Neutral  - 2 x daily - 7 x weekly - 2 sets - 10 reps - Supine Shoulder Flexion Extension AAROM with Dowel  - 2 x daily - 7 x weekly - 2 sets - 10 reps - Scapular Stability on Wall With Resistance Band (Hemiplegia)   - 1 x daily - 7 x weekly - 1 sets - 5 reps  ASSESSMENT:  CLINICAL IMPRESSION: Alex Logan verbalized occasional discomfort in his right biceps, but it does not impact his function. Noted increased muscle spasms that responded well to muscle techniques. Patient verbalized relief. Incorporated more overhead exercises and exercises on unstable surfaces. Patient did not verbalize any pain or discomfort. PT monitored response and provided verbal and visual cues as needed. Patient will benefit from skilled PT to address the below  impairments and improve overall function.       Eval: Patient is a 58 y.o. male who was seen today for physical therapy evaluation and treatment for right shoulder stiffness status post right shoulder coracoid transfer. [place] presents with a history of a right shoulder dislocation that happened at work 5 years ago. Over the years his injury got worse and it restricted his function. His surgery date was Oct 3rd and based on protocol he to wear the sling until Oct 31st. Patient did not have on the sling today and educated him on the importance of wearing until next week. Noted increased muscle guarding with right shoulder PROM, but motions were not painful and no increased restrictions were noted. Patient is not currently working, but his job duties required bending, lifting and stooping. Patient is highly motivated and wants to get back to his normal function. Patient will benefit from skilled PT to address the below impairments and improve overall function.    OBJECTIVE IMPAIRMENTS: decreased ROM, decreased strength, hypomobility, increased muscle spasms, impaired flexibility, impaired UE functional use, and pain.   ACTIVITY LIMITATIONS: carrying, lifting, bending, sleeping, dressing, reach over head, and hygiene/grooming  PARTICIPATION LIMITATIONS: meal prep, cleaning, laundry, community activity, and occupation  PERSONAL FACTORS: Fitness and 3+ comorbidities: HTN; Anxiety; Depression are also affecting patient's functional outcome.   REHAB POTENTIAL: Good  CLINICAL DECISION MAKING: Evolving/moderate complexity  EVALUATION COMPLEXITY: Low  GOALS: Goals reviewed with patient? Yes  SHORT TERM GOALS: Target date: 11/26/2023  Patient will be independent with initial HEP. Baseline:  Goal status: MET 11/08/2023  2.  .  Patient will demonstrate > or = to 110 degrees in Rt shoulder P/ROM for improved reaching. Baseline:  Goal status: Met on 11/20/23   3.  Patient will report > or =  to 30% use of Rt UE with light home and self care tasks due to improved ROM and strength. Baseline:  Goal status: MET (reports 80% improvement on 11/20/23)    LONG TERM GOALS: Target date: 12/28/2023  Patient will demonstrate independence in advanced HEP. Baseline:  Goal status: Ongoing  2.  Patient will report > or = to 70% of  normal use of Rt UE for ADLs due to improved functional strength and A/ROM. Baseline:  Goal status: MET 80% 12/03/2023  3. .  Patient will demonstrate > or = to 115 degrees in Rt shoulder A/ROM for improved reaching overhead. Baseline:  Goal status: Ongoing   4.  Patient will demonstrate correct bending and lifting technique for safe return to work with no limitations.  Baseline:  Goal status: INITIAL  5.  Patient will score no greater than 12% on Quick DASH due to improved right shoulder function. Baseline: 31.8% Goal status: IN PROGRESS (adjusted goal on 11/20/23)   PLAN: PT FREQUENCY: 2x/week  PT DURATION: 8 weeks  PLANNED INTERVENTIONS: 97164- PT Re-evaluation, 97110-Therapeutic exercises, 97530- Therapeutic activity, 97112- Neuromuscular re-education, 97535- Self Care, 02859- Manual therapy, [phone]- Canalith repositioning, [id number]- Aquatic Therapy, [phone]- Electrical stimulation (unattended), [phone]- Electrical stimulation (manual), [id number]- Vasopneumatic device, [id number]- Ultrasound, [id number]- Traction (mechanical), [id number]- Ionotophoresis 4mg /ml Dexamethasone, 79439 (1-2 muscles), [id number] (3+ muscles)- Dry Needling, Patient/Family education, Balance training, Stair training, Taping, Joint mobilization, Joint manipulation, Spinal manipulation, Spinal mobilization, Scar mobilization, Vestibular training, Cryotherapy, and Moist heat  PLAN FOR NEXT SESSION: continue shoulder + periscapular stability exercises; follow protocol in media/PT referral     Kristeen Sar, PT, DPT 12/21/2023 10:18 AM [facility] [address] [place], [place] [address] Phone # [phone] Fax [phone]

## 2023-12-24 ENCOUNTER — Ambulatory Visit: Admitting: Physical Therapy

## 2023-12-24 ENCOUNTER — Encounter: Payer: Self-pay | Admitting: Physical Therapy

## 2023-12-24 DIAGNOSIS — M25511 Pain in right shoulder: Secondary | ICD-10-CM | POA: Diagnosis not present

## 2023-12-24 DIAGNOSIS — M25611 Stiffness of right shoulder, not elsewhere classified: Secondary | ICD-10-CM | POA: Diagnosis not present

## 2023-12-24 DIAGNOSIS — G8929 Other chronic pain: Secondary | ICD-10-CM | POA: Diagnosis not present

## 2023-12-24 DIAGNOSIS — R293 Abnormal posture: Secondary | ICD-10-CM

## 2023-12-24 DIAGNOSIS — M6281 Muscle weakness (generalized): Secondary | ICD-10-CM | POA: Diagnosis not present

## 2023-12-24 NOTE — Therapy (Signed)
 OUTPATIENT PHYSICAL THERAPY UPPER EXTREMITY TREATMENT   Patient Name: Alex Logan MRN: [id number] DOB:[birth year], 58 y.o., male Today's Date: 12/24/2023  END OF SESSION:  PT End of Session - 12/24/23 1443     Visit Number 14    Date for Recertification  12/28/23    Authorization Type [employer] Approved 16 vl 10/29/23-12/28/23-auth# [id number] (27 VL)    Authorization Time Period 10/29/23-12/28/23    Authorization - Visit Number 13    Authorization - Number of Visits 16    PT Start Time 1400    PT Stop Time 1442    PT Time Calculation (min) 42 min    Activity Tolerance Patient tolerated treatment well    Behavior During Therapy WFL for tasks assessed/performed                      Past Medical History:  Diagnosis Date   Alcohol abuse    Anxiety    Depression    GERD (gastroesophageal reflux disease)    Hypertension    Past Surgical History:  Procedure Laterality Date   NO PAST SURGERIES     SHOULDER SURGERY Right 10/15/2023   Patient Active Problem List   Diagnosis Date Noted   Viral wart on finger 12/14/2023   Gastroesophageal reflux disease 12/04/2023   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   Prediabetes 12/04/2023   Cerumen debris on tympanic membrane, bilateral 12/04/2023   Osteoarthritis of left knee 11/01/2023   Depression with anxiety 05/27/2017   Hypertension 05/23/2017   Alcohol use disorder, severe, in sustained remission (HCC) 09/27/2016    PCP: None  REFERRING PROVIDER:    Brendan Gauze, PA-C    REFERRING DIAG: [phone] status post shoulder surgery   THERAPY DIAG:  Abnormal posture  Muscle weakness (generalized)  Chronic right shoulder pain  Stiffness of right shoulder, not elsewhere classified  Rationale for Evaluation and Treatment: Rehabilitation  ONSET DATE: Surgery Date Oct 3 , 2025  SUBJECTIVE:                                                                                                                                                                                       SUBJECTIVE STATEMENT: Patient reports his shoulder is okay today. Still the same.  Eval: Patient presents status post right shoulder coracoid transfer on Oct 12, 2023. He is supposed to be in the sling until Oct 31st, but he does not have it on today. He had a follow up appointment 10/26/2023 and everything is healing well. He dislocated it shoulder 5 years ago from work accident, and the injury just kept getting worse.  Hand dominance: Right  PERTINENT  HISTORY: HTN; Anxiety; Depression  PAIN:  Are you having pain? Yes: NPRS scale: only shoulder discomfort with elevation but it is mild Pain location: coracoid process  Pain description: discomfort Aggravating factors: reaching Relieving factors: Tylenol    PRECAUTIONS: Shoulder see protocol in media  RED FLAGS: None   WEIGHT BEARING RESTRICTIONS: No  FALLS:  Has patient fallen in last 6 months? No  LIVING ENVIRONMENT: Lives with: lives alone Lives in: House/apartment Stairs: Yes: Internal: 12 steps; on right going up   OCCUPATION: Not currently working- Works at [employer] (standing, bending, lifting )  PLOF: Independent, Independent with basic ADLs, Independent with household mobility without device, Independent with community mobility without device, Independent with gait, Independent with transfers, and Leisure: Walk  PATIENT GOALS: To get back to normal activities  NEXT MD VISIT: Week of Nov 10th   OBJECTIVE:  Note: Objective measures were completed at Evaluation unless otherwise noted.  DIAGNOSTIC FINDINGS:  04/12/2023 FINDINGS: Anterior-inferior dislocation of the right humeral head relative to the glenoid. No acute displaced fracture. There is no evidence of arthropathy or other focal bone abnormality. Soft tissues are unremarkable. Old fracture of the right posterior fifth rib.   IMPRESSION: Anterior right shoulder dislocation.  PATIENT  SURVEYS :  Eval:  Quick Dash: 31.8/100 31.8%  11/20/2023:  Quick DASH 15.91%  Minimally Clinically Important Difference (MCID): 15-20 points   COGNITION: Overall cognitive status: Within functional limits for tasks assessed     SENSATION: WFL  POSTURE: Rounded shoulders & forward head  UPPER EXTREMITY ROM:   Eval: PROM : shoulder flexion in scapular plane to 90 degrees (no pain or restriction), Mild discomfort with PROM to ER to neutral  11/20/2023: Right shoulder flexion AA/ROM:  115 degrees    12/03/2023 Right A/ROM: Flexion 140 degrees Abduction 135 degrees IR:T8 ER: T2  UPPER EXTREMITY MMT:   MMT Right eval Left eval  Shoulder flexion    Shoulder extension    Shoulder abduction    Shoulder adduction    Shoulder internal rotation    Shoulder external rotation    Middle trapezius    Lower trapezius    Elbow flexion    Elbow extension    Wrist flexion    Wrist extension    Wrist ulnar deviation    Wrist radial deviation    Wrist pronation    Wrist supination    Grip strength (lbs) 90 82  (Blank rows = not tested)   PALPATION:  Increased muscle spasms right biceps Increased muscle guarding to right shoulder PROM                                                                                                                             TREATMENT DATE:  12/24/23: UBE 3 min forward/backward 2.5 - PT present to discuss status Iso shoulder abduction + flexion with yellow loop 2 x 8 3way scap stabilization with yellow loop x 8 each direction bilateral  Y's at  wall with yellow loop x 10  Lat pull down 45# 2 x 10 Single arm row at cable column 10# 2 x 10 Rt  Standing D 2 flexion with blue TB 2 x 10 Rt (against wall to prevent compensations) Standing ER with blue TB x 20 Bottoms up 5# KB press 2 x 10 Prone shoulder row, extension , abduction  2x10 Rt 3# DB  90/90 door way pec stretch x 30 sec     12/21/23: UBE 3 min forward/backward 2.5 - PT  present to discuss status Iso shoulder abduction + flexion with yellow loop 2 x 10 3way scap stabilization with yellow loop x 8 each direction bilateral  Lat pull down 35# 2 x 10 Seated matrix row 30# 2 x 10 Standing D 2 flexion with green TB 2 x 10 Rt  Standing ER with green TB x 20 Standing shoulder abduction with green TB 2 x10 4# DB press x 10 Plank on knees over BOSU (side to side/ up/down) x 10 each direction Supine holding 4# DB straight up (up/down,sideto side, clockwise, counterclockwise) x 10 each direction Supine serratus punch 4# 2 x 10 Manual: STM to right biceps Patient requested to leave early  12/17/23: UBE 3 min forward/backward 2.5 - PT present to discuss status 4D scap stabilization with yellow plyo ball  3way scap stabilization with yellow loop x 8 each direction bilateral  Lat pull down 35# 2 x 10 Seated shoulder flexion & scaption 3# 2 x 10 each direction Standing D 2 flexion with green TB 2 x 10 Rt  Standing ER with green TB x 20 Counter push up 2 x 5 Prone shoulder row, extension , abduction  2x10 Rt 2# DB   12/10/2023: UBE 3/3 level 2.5 - PT present to discuss status 4D scap stabilization with yellow plyo ball  3way scap stabilization with blue loop x 8 each direction bilateral  Lat pull down 35# 2 x 10 Seated matrix row 30# 2 x 10 Seated shoulder flexion & scaption 3# 2 x 10 each direction Standing D 2 flexion with green TB 2 x 10 Rt  Standing ER with green TB x 20 Counter push up 2 x 5 5# Bottoms up KB press 2 x 5 Prone shoulder row, extension , abduction  x 12 Rt 2# DB today     PATIENT EDUCATION: Education details: PT eval findings, anticipated POC, progress with PT, and initial HEP Person educated: Patient Education method: Explanation, Demonstration, and Handouts Education comprehension: verbalized understanding, returned demonstration, and needs further education  HOME EXERCISE PROGRAM: Access Code: [email or web address] URL:  [email or web address] Date: 11/26/2023 Prepared by: Kristeen Sar  Exercises - Seated Scapular Retraction  - 1 x daily - 7 x weekly - 1 sets - 10 reps - Seated Elbow Flexion and Extension AROM  - 1 x daily - 7 x weekly - 1 sets - 10 reps - Wrist Extension AROM  - 1 x daily - 7 x weekly - 1 sets - 10 reps - Isometric Shoulder Abduction at Wall  - 1 x daily - 7 x weekly - 1-2 sets - 5 reps - 5s hold - Seated Shoulder External Rotation AAROM with Cane and Hand in Neutral  - 2 x daily - 7 x weekly - 2 sets - 10 reps - Supine Shoulder Flexion Extension AAROM with Dowel  - 2 x daily - 7 x weekly - 2 sets - 10 reps - Scapular Stability on Wall With Resistance Band (Hemiplegia)   -  1 x daily - 7 x weekly - 1 sets - 5 reps  ASSESSMENT:  CLINICAL IMPRESSION: Diogenes continues to verbalized some discomfort in his right biceps. He said he did not do any massage soft tissue techniques over the weekend. Today we increased his weights with all exercises. He required verbal and tactile cues for form correction. Patient compensated with elbow extension with a few exercises. Educated him on the importance of performing his doorway pec stretch to target tightness. Discussed POC with patient and plan to discharge patient next treatment session.        Eval: Patient is a 58 y.o. male who was seen today for physical therapy evaluation and treatment for right shoulder stiffness status post right shoulder coracoid transfer. [place] presents with a history of a right shoulder dislocation that happened at work 5 years ago. Over the years his injury got worse and it restricted his function. His surgery date was Oct 3rd and based on protocol he to wear the sling until Oct 31st. Patient did not have on the sling today and educated him on the importance of wearing until next week. Noted increased muscle guarding with right shoulder PROM, but motions were not painful and no increased restrictions were noted.  Patient is not currently working, but his job duties required bending, lifting and stooping. Patient is highly motivated and wants to get back to his normal function. Patient will benefit from skilled PT to address the below impairments and improve overall function.    OBJECTIVE IMPAIRMENTS: decreased ROM, decreased strength, hypomobility, increased muscle spasms, impaired flexibility, impaired UE functional use, and pain.   ACTIVITY LIMITATIONS: carrying, lifting, bending, sleeping, dressing, reach over head, and hygiene/grooming  PARTICIPATION LIMITATIONS: meal prep, cleaning, laundry, community activity, and occupation  PERSONAL FACTORS: Fitness and 3+ comorbidities: HTN; Anxiety; Depression are also affecting patient's functional outcome.   REHAB POTENTIAL: Good  CLINICAL DECISION MAKING: Evolving/moderate complexity  EVALUATION COMPLEXITY: Low  GOALS: Goals reviewed with patient? Yes  SHORT TERM GOALS: Target date: 11/26/2023  Patient will be independent with initial HEP. Baseline:  Goal status: MET 11/08/2023  2.  .  Patient will demonstrate > or = to 110 degrees in Rt shoulder P/ROM for improved reaching. Baseline:  Goal status: Met on 11/20/23   3.  Patient will report > or = to 30% use of Rt UE with light home and self care tasks due to improved ROM and strength. Baseline:  Goal status: MET (reports 80% improvement on 11/20/23)    LONG TERM GOALS: Target date: 12/28/2023  Patient will demonstrate independence in advanced HEP. Baseline:  Goal status: Ongoing  2.  Patient will report > or = to 70% of normal use of Rt UE for ADLs due to improved functional strength and A/ROM. Baseline:  Goal status: MET 80% 12/03/2023  3. .  Patient will demonstrate > or = to 115 degrees in Rt shoulder A/ROM for improved reaching overhead. Baseline:  Goal status: Ongoing   4.  Patient will demonstrate correct bending and lifting technique for safe return to work with no  limitations.  Baseline:  Goal status: INITIAL  5.  Patient will score no greater than 12% on Quick DASH due to improved right shoulder function. Baseline: 31.8% Goal status: IN PROGRESS (adjusted goal on 11/20/23)   PLAN: PT FREQUENCY: 2x/week  PT DURATION: 8 weeks  PLANNED INTERVENTIONS: 97164- PT Re-evaluation, 97110-Therapeutic exercises, 97530- Therapeutic activity, [id number]- Neuromuscular re-education, 97535- Self Care, 02859- Manual therapy, [id number]- Canalith repositioning, [id number]-  Aquatic Therapy, [phone]- Electrical stimulation (unattended), [id number]- Electrical stimulation (manual), 02983- Vasopneumatic device, [id number]- Ultrasound, [id number]- Traction (mechanical), 02966- Ionotophoresis 4mg /ml Dexamethasone, 20560 (1-2 muscles), 20561 (3+ muscles)- Dry Needling, Patient/Family education, Balance training, Stair training, Taping, Joint mobilization, Joint manipulation, Spinal manipulation, Spinal mobilization, Scar mobilization, Vestibular training, Cryotherapy, and Moist heat  PLAN FOR NEXT SESSION: assess goals & discharge patient     Kristeen Sar, PT, DPT 12/24/2023 2:44 PM [facility] [address], [address] [place], [place] [address] Phone # [phone] Fax [phone]

## 2023-12-27 ENCOUNTER — Ambulatory Visit: Admitting: Physical Therapy

## 2023-12-27 DIAGNOSIS — K219 Gastro-esophageal reflux disease without esophagitis: Secondary | ICD-10-CM | POA: Diagnosis not present

## 2023-12-27 DIAGNOSIS — K449 Diaphragmatic hernia without obstruction or gangrene: Secondary | ICD-10-CM | POA: Diagnosis not present

## 2023-12-28 ENCOUNTER — Encounter: Payer: Self-pay | Admitting: Physical Therapy

## 2023-12-28 DIAGNOSIS — F411 Generalized anxiety disorder: Secondary | ICD-10-CM | POA: Diagnosis not present

## 2023-12-28 DIAGNOSIS — F331 Major depressive disorder, recurrent, moderate: Secondary | ICD-10-CM | POA: Diagnosis not present

## 2024-01-08 ENCOUNTER — Other Ambulatory Visit: Payer: Self-pay | Admitting: Student in an Organized Health Care Education/Training Program

## 2024-01-08 MED ORDER — ESCITALOPRAM OXALATE 20 MG PO TABS: 20.0000 mg | ORAL_TABLET | Freq: Every day | ORAL | 3 refills | Status: AC

## 2024-01-08 NOTE — Telephone Encounter (Signed)
 Patient is requesting a refill on medication under your name  Requested Prescriptions   Pending Prescriptions Disp Refills   escitalopram  (LEXAPRO ) 20 MG tablet      Sig: Take 0.5 tablets (10 mg total) by mouth daily.     Date of patient request: 01/08/24 Last office visit: 12/14/2023 Upcoming visit: 03/06/2024 Date of last refill: 11/15/20 Last refill amount: 30

## 2024-01-08 NOTE — Telephone Encounter (Signed)
 Copied from CRM #[id number]. Topic: Clinical - Medication Refill >> Jan 08, 2024 12:43 PM Sasha M wrote: Medication: escitalopram  (LEXAPRO ) 20 MG tablet  Has the patient contacted their pharmacy? Yes (Agent: If no, request that the patient contact the pharmacy for the refill. If patient does not wish to contact the pharmacy document the reason why and proceed with request.) (Agent: If yes, when and what did the pharmacy advise?) Sent a request  This is the patient's preferred pharmacy:  [facility] [id number] - [place], [place] - [address] [place] [address] Phone: [phone] Fax: [phone]  Is this the correct pharmacy for this prescription? Yes If no, delete pharmacy and type the correct one.   Has the prescription been filled recently? No  Is the patient out of the medication? Yes  Has the patient been seen for an appointment in the last year OR does the patient have an upcoming appointment? Yes  Can we respond through MyChart? Yes  Agent: Please be advised that Rx refills may take up to 3 business days. We ask that you follow-up with your pharmacy.

## 2024-02-01 ENCOUNTER — Encounter: Payer: Self-pay | Admitting: Student in an Organized Health Care Education/Training Program

## 2024-02-06 ENCOUNTER — Ambulatory Visit: Admitting: Student in an Organized Health Care Education/Training Program

## 2024-02-07 ENCOUNTER — Ambulatory Visit: Admitting: Student in an Organized Health Care Education/Training Program

## 2024-02-08 ENCOUNTER — Ambulatory Visit: Admitting: Student in an Organized Health Care Education/Training Program

## 2024-02-21 ENCOUNTER — Ambulatory Visit: Admitting: Student in an Organized Health Care Education/Training Program

## 2024-03-06 ENCOUNTER — Ambulatory Visit: Admitting: Student in an Organized Health Care Education/Training Program
